# Patient Record
Sex: Female | Born: 1975
Health system: Southern US, Community
[De-identification: ages and names within clinical notes are randomized; demographics above are authoritative.]

## PROBLEM LIST (undated history)

## (undated) DIAGNOSIS — F32A Depression, unspecified: Secondary | ICD-10-CM

## (undated) DIAGNOSIS — R51 Headache: Secondary | ICD-10-CM

## (undated) DIAGNOSIS — Z87442 Personal history of urinary calculi: Secondary | ICD-10-CM

## (undated) DIAGNOSIS — R112 Nausea with vomiting, unspecified: Secondary | ICD-10-CM

## (undated) DIAGNOSIS — Z9889 Other specified postprocedural states: Secondary | ICD-10-CM

## (undated) DIAGNOSIS — E876 Hypokalemia: Secondary | ICD-10-CM

## (undated) DIAGNOSIS — F411 Generalized anxiety disorder: Secondary | ICD-10-CM

## (undated) DIAGNOSIS — M3500 Sicca syndrome, unspecified: Secondary | ICD-10-CM

## (undated) DIAGNOSIS — N3289 Other specified disorders of bladder: Secondary | ICD-10-CM

## (undated) DIAGNOSIS — I10 Essential (primary) hypertension: Secondary | ICD-10-CM

## (undated) DIAGNOSIS — R55 Syncope and collapse: Secondary | ICD-10-CM

## (undated) DIAGNOSIS — N289 Disorder of kidney and ureter, unspecified: Secondary | ICD-10-CM

## (undated) DIAGNOSIS — K649 Unspecified hemorrhoids: Secondary | ICD-10-CM

## (undated) DIAGNOSIS — I493 Ventricular premature depolarization: Secondary | ICD-10-CM

## (undated) DIAGNOSIS — K5909 Other constipation: Secondary | ICD-10-CM

## (undated) DIAGNOSIS — K219 Gastro-esophageal reflux disease without esophagitis: Secondary | ICD-10-CM

## (undated) DIAGNOSIS — R0602 Shortness of breath: Secondary | ICD-10-CM

## (undated) DIAGNOSIS — F329 Major depressive disorder, single episode, unspecified: Secondary | ICD-10-CM

## (undated) DIAGNOSIS — G8929 Other chronic pain: Secondary | ICD-10-CM

## (undated) DIAGNOSIS — R42 Dizziness and giddiness: Secondary | ICD-10-CM

## (undated) DIAGNOSIS — N83209 Unspecified ovarian cyst, unspecified side: Secondary | ICD-10-CM

## (undated) DIAGNOSIS — I472 Ventricular tachycardia: Secondary | ICD-10-CM

## (undated) DIAGNOSIS — B019 Varicella without complication: Secondary | ICD-10-CM

## (undated) DIAGNOSIS — T8859XA Other complications of anesthesia, initial encounter: Secondary | ICD-10-CM

## (undated) DIAGNOSIS — T7840XA Allergy, unspecified, initial encounter: Secondary | ICD-10-CM

## (undated) DIAGNOSIS — F410 Panic disorder [episodic paroxysmal anxiety] without agoraphobia: Secondary | ICD-10-CM

## (undated) DIAGNOSIS — Z9109 Other allergy status, other than to drugs and biological substances: Secondary | ICD-10-CM

## (undated) DIAGNOSIS — T4145XA Adverse effect of unspecified anesthetic, initial encounter: Secondary | ICD-10-CM

## (undated) DIAGNOSIS — I951 Orthostatic hypotension: Secondary | ICD-10-CM

## (undated) DIAGNOSIS — O24419 Gestational diabetes mellitus in pregnancy, unspecified control: Secondary | ICD-10-CM

## (undated) HISTORY — PX: WISDOM TOOTH EXTRACTION: SHX21

## (undated) HISTORY — PX: TUBAL LIGATION: SHX77

## (undated) HISTORY — DX: Gestational diabetes mellitus in pregnancy, unspecified control: O24.419

## (undated) HISTORY — DX: Other chronic pain: G89.29

## (undated) HISTORY — PX: ABDOMINAL HYSTERECTOMY: SHX81

## (undated) HISTORY — DX: Unspecified hemorrhoids: K64.9

## (undated) HISTORY — DX: Major depressive disorder, single episode, unspecified: F32.9

## (undated) HISTORY — DX: Essential (primary) hypertension: I10

## (undated) HISTORY — DX: Other allergy status, other than to drugs and biological substances: Z91.09

## (undated) HISTORY — DX: Shortness of breath: R06.02

## (undated) HISTORY — PX: EXPLORATORY LAPAROTOMY: SUR591

## (undated) HISTORY — DX: Panic disorder (episodic paroxysmal anxiety): F41.0

## (undated) HISTORY — DX: Ventricular tachycardia: I47.2

## (undated) HISTORY — DX: Varicella without complication: B01.9

## (undated) HISTORY — DX: Dizziness and giddiness: R42

## (undated) HISTORY — DX: Syncope and collapse: R55

## (undated) HISTORY — DX: Depression, unspecified: F32.A

## (undated) HISTORY — DX: Generalized anxiety disorder: F41.1

## (undated) HISTORY — DX: Headache: R51

## (undated) HISTORY — DX: Unspecified ovarian cyst, unspecified side: N83.209

## (undated) HISTORY — DX: Allergy, unspecified, initial encounter: T78.40XA

---

## 2018-03-03 ENCOUNTER — Emergency Department (HOSPITAL_COMMUNITY): Payer: Managed Care, Other (non HMO) | Admitting: Certified Registered Nurse Anesthetist

## 2018-03-03 ENCOUNTER — Other Ambulatory Visit: Payer: Self-pay

## 2018-03-03 ENCOUNTER — Encounter (HOSPITAL_COMMUNITY)
Admission: EM | Disposition: A | Discharge status: Home / Self Care | Payer: Self-pay | Source: Home / Self Care | Attending: Physician Assistant

## 2018-03-03 ENCOUNTER — Observation Stay (HOSPITAL_COMMUNITY): Payer: Managed Care, Other (non HMO)

## 2018-03-03 ENCOUNTER — Observation Stay (HOSPITAL_COMMUNITY)
Admission: EM | Admit: 2018-03-03 | Discharge: 2018-03-04 | Disposition: A | Discharge status: Home / Self Care | Payer: Managed Care, Other (non HMO) | Attending: Urology | Admitting: Urology

## 2018-03-03 ENCOUNTER — Encounter (HOSPITAL_COMMUNITY): Payer: Self-pay | Admitting: Emergency Medicine

## 2018-03-03 ENCOUNTER — Emergency Department (HOSPITAL_COMMUNITY): Payer: Managed Care, Other (non HMO)

## 2018-03-03 DIAGNOSIS — Z888 Allergy status to other drugs, medicaments and biological substances status: Secondary | ICD-10-CM | POA: Diagnosis not present

## 2018-03-03 DIAGNOSIS — E876 Hypokalemia: Secondary | ICD-10-CM | POA: Insufficient documentation

## 2018-03-03 DIAGNOSIS — M3500 Sicca syndrome, unspecified: Secondary | ICD-10-CM | POA: Diagnosis not present

## 2018-03-03 DIAGNOSIS — N132 Hydronephrosis with renal and ureteral calculous obstruction: Principal | ICD-10-CM | POA: Insufficient documentation

## 2018-03-03 DIAGNOSIS — I493 Ventricular premature depolarization: Secondary | ICD-10-CM | POA: Diagnosis not present

## 2018-03-03 DIAGNOSIS — N200 Calculus of kidney: Secondary | ICD-10-CM

## 2018-03-03 DIAGNOSIS — Z87442 Personal history of urinary calculi: Secondary | ICD-10-CM | POA: Diagnosis not present

## 2018-03-03 DIAGNOSIS — N179 Acute kidney failure, unspecified: Secondary | ICD-10-CM | POA: Insufficient documentation

## 2018-03-03 DIAGNOSIS — R079 Chest pain, unspecified: Secondary | ICD-10-CM

## 2018-03-03 DIAGNOSIS — I700 Atherosclerosis of aorta: Secondary | ICD-10-CM | POA: Diagnosis not present

## 2018-03-03 DIAGNOSIS — Z79899 Other long term (current) drug therapy: Secondary | ICD-10-CM | POA: Insufficient documentation

## 2018-03-03 DIAGNOSIS — I951 Orthostatic hypotension: Secondary | ICD-10-CM | POA: Insufficient documentation

## 2018-03-03 DIAGNOSIS — Z419 Encounter for procedure for purposes other than remedying health state, unspecified: Secondary | ICD-10-CM

## 2018-03-03 HISTORY — DX: Ventricular premature depolarization: I49.3

## 2018-03-03 HISTORY — DX: Hypokalemia: E87.6

## 2018-03-03 HISTORY — PX: CYSTOSCOPY WITH STENT PLACEMENT: SHX5790

## 2018-03-03 HISTORY — DX: Orthostatic hypotension: I95.1

## 2018-03-03 HISTORY — DX: Sjogren syndrome, unspecified: M35.00

## 2018-03-03 LAB — URINALYSIS, ROUTINE W REFLEX MICROSCOPIC
BILIRUBIN URINE: NEGATIVE
Bacteria, UA: NONE SEEN
Glucose, UA: NEGATIVE mg/dL
Ketones, ur: NEGATIVE mg/dL
NITRITE: NEGATIVE
PH: 6 (ref 5.0–8.0)
Protein, ur: NEGATIVE mg/dL
Specific Gravity, Urine: 1.01 (ref 1.005–1.030)

## 2018-03-03 LAB — COMPREHENSIVE METABOLIC PANEL
ALT: 12 U/L — AB (ref 14–54)
AST: 15 U/L (ref 15–41)
Albumin: 3.5 g/dL (ref 3.5–5.0)
Alkaline Phosphatase: 45 U/L (ref 38–126)
Anion gap: 10 (ref 5–15)
BUN: 48 mg/dL — ABNORMAL HIGH (ref 6–20)
CALCIUM: 9 mg/dL (ref 8.9–10.3)
CHLORIDE: 110 mmol/L (ref 101–111)
CO2: 17 mmol/L — ABNORMAL LOW (ref 22–32)
CREATININE: 6.44 mg/dL — AB (ref 0.44–1.00)
GFR, EST AFRICAN AMERICAN: 8 mL/min — AB (ref >60)
GFR, EST NON AFRICAN AMERICAN: 7 mL/min — AB (ref >60)
Glucose, Bld: 99 mg/dL (ref 65–99)
Potassium: 4.3 mmol/L (ref 3.5–5.1)
Sodium: 137 mmol/L (ref 135–145)
TOTAL PROTEIN: 6.8 g/dL (ref 6.5–8.1)
Total Bilirubin: 0.5 mg/dL (ref 0.3–1.2)

## 2018-03-03 LAB — I-STAT BETA HCG BLOOD, ED (MC, WL, AP ONLY): I-stat hCG, quantitative: <5 m[IU]/mL (ref <5)

## 2018-03-03 LAB — CBC
HCT: 33.3 % — ABNORMAL LOW (ref 36.0–46.0)
HEMATOCRIT: 33.7 % — AB (ref 36.0–46.0)
HEMOGLOBIN: 10.7 g/dL — AB (ref 12.0–15.0)
Hemoglobin: 10.9 g/dL — ABNORMAL LOW (ref 12.0–15.0)
MCH: 30.1 pg (ref 26.0–34.0)
MCH: 29.8 pg (ref 26.0–34.0)
MCHC: 32.7 g/dL (ref 30.0–36.0)
MCHC: 31.8 g/dL (ref 30.0–36.0)
MCV: 92 fL (ref 78.0–100.0)
MCV: 93.9 fL (ref 78.0–100.0)
Platelets: 142 10*3/uL — ABNORMAL LOW (ref 150–400)
Platelets: 155 10*3/uL (ref 150–400)
RBC: 3.62 MIL/uL — ABNORMAL LOW (ref 3.87–5.11)
RBC: 3.59 MIL/uL — AB (ref 3.87–5.11)
RDW: 12.4 % (ref 11.5–15.5)
RDW: 12.6 % (ref 11.5–15.5)
WBC: 6.1 10*3/uL (ref 4.0–10.5)
WBC: 6.1 10*3/uL (ref 4.0–10.5)

## 2018-03-03 LAB — BASIC METABOLIC PANEL
ANION GAP: 10 (ref 5–15)
BUN: 40 mg/dL — ABNORMAL HIGH (ref 6–20)
CALCIUM: 8.5 mg/dL — AB (ref 8.9–10.3)
CO2: 16 mmol/L — AB (ref 22–32)
Chloride: 116 mmol/L — ABNORMAL HIGH (ref 101–111)
Creatinine, Ser: 4.68 mg/dL — ABNORMAL HIGH (ref 0.44–1.00)
GFR calc non Af Amer: 11 mL/min — ABNORMAL LOW (ref >60)
GFR, EST AFRICAN AMERICAN: 12 mL/min — AB (ref >60)
Glucose, Bld: 89 mg/dL (ref 65–99)
POTASSIUM: 4.5 mmol/L (ref 3.5–5.1)
Sodium: 142 mmol/L (ref 135–145)

## 2018-03-03 LAB — CK TOTAL AND CKMB (NOT AT ARMC)
CK, MB: 1.1 ng/mL (ref 0.5–5.0)
RELATIVE INDEX: INVALID (ref 0.0–2.5)
Total CK: 43 U/L (ref 38–234)

## 2018-03-03 LAB — LIPASE, BLOOD: LIPASE: 38 U/L (ref 11–51)

## 2018-03-03 LAB — CK: Total CK: 51 U/L (ref 38–234)

## 2018-03-03 SURGERY — CYSTOSCOPY, WITH STENT INSERTION
Anesthesia: General | Site: Bladder | Laterality: Bilateral

## 2018-03-03 MED ORDER — ROCURONIUM BROMIDE 10 MG/ML (PF) SYRINGE
PREFILLED_SYRINGE | INTRAVENOUS | Status: AC
  Filled 2018-03-03: qty 5

## 2018-03-03 MED ORDER — SENNOSIDES-DOCUSATE SODIUM 8.6-50 MG PO TABS: 1.0000 | ORAL_TABLET | Freq: Every evening | ORAL | Status: DC | PRN

## 2018-03-03 MED ORDER — GABAPENTIN 600 MG PO TABS: 600.0000 mg | ORAL_TABLET | Freq: Every evening | ORAL | Status: DC | PRN

## 2018-03-03 MED ORDER — MORPHINE SULFATE (PF) 4 MG/ML IV SOLN
0.5000 mg | Freq: Once | INTRAVENOUS | Status: AC
  Administered 2018-03-03: 0.5 mg via INTRAVENOUS

## 2018-03-03 MED ORDER — PROPOFOL 10 MG/ML IV BOLUS
INTRAVENOUS | Status: DC | PRN
  Administered 2018-03-03: 150 mg via INTRAVENOUS

## 2018-03-03 MED ORDER — MORPHINE SULFATE (PF) 4 MG/ML IV SOLN
4.0000 mg | Freq: Once | INTRAVENOUS | Status: AC
  Administered 2018-03-03: 4 mg via INTRAVENOUS
  Filled 2018-03-03: qty 1

## 2018-03-03 MED ORDER — FLEET ENEMA 7-19 GM/118ML RE ENEM
1.0000 | ENEMA | Freq: Once | RECTAL | Status: DC | PRN
  Filled 2018-03-03 (×2): qty 1

## 2018-03-03 MED ORDER — SUCCINYLCHOLINE CHLORIDE 20 MG/ML IJ SOLN
INTRAMUSCULAR | Status: DC | PRN
  Administered 2018-03-03: 80 mg via INTRAVENOUS

## 2018-03-03 MED ORDER — DIPHENHYDRAMINE HCL 50 MG/ML IJ SOLN: 12.5000 mg | Freq: Four times a day (QID) | INTRAMUSCULAR | Status: DC | PRN

## 2018-03-03 MED ORDER — LIDOCAINE HCL (CARDIAC) PF 100 MG/5ML IV SOSY
PREFILLED_SYRINGE | INTRAVENOUS | Status: DC | PRN
  Administered 2018-03-03: 50 mg via INTRAVENOUS
  Administered 2018-03-03: 50 mg via INTRATRACHEAL

## 2018-03-03 MED ORDER — SODIUM CHLORIDE 0.9 % IV BOLUS
500.0000 mL | Freq: Once | INTRAVENOUS | Status: AC
  Administered 2018-03-03: 500 mL via INTRAVENOUS

## 2018-03-03 MED ORDER — MEPERIDINE HCL 50 MG/ML IJ SOLN
INTRAMUSCULAR | Status: AC
  Filled 2018-03-03: qty 1

## 2018-03-03 MED ORDER — TRAMADOL HCL 50 MG PO TABS: 50.0000 mg | ORAL_TABLET | Freq: Two times a day (BID) | ORAL | Status: DC | PRN

## 2018-03-03 MED ORDER — DEXAMETHASONE SODIUM PHOSPHATE 10 MG/ML IJ SOLN
INTRAMUSCULAR | Status: DC | PRN
  Administered 2018-03-03: 10 mg via INTRAVENOUS

## 2018-03-03 MED ORDER — MIDAZOLAM HCL 2 MG/2ML IJ SOLN
INTRAMUSCULAR | Status: AC
  Filled 2018-03-03: qty 2

## 2018-03-03 MED ORDER — LORAZEPAM 0.5 MG PO TABS: 0.5000 mg | ORAL_TABLET | Freq: Every day | ORAL | Status: DC | PRN

## 2018-03-03 MED ORDER — ONDANSETRON HCL 4 MG/2ML IJ SOLN
INTRAMUSCULAR | Status: DC | PRN
  Administered 2018-03-03: 4 mg via INTRAVENOUS

## 2018-03-03 MED ORDER — BISACODYL 10 MG RE SUPP: 10.0000 mg | Freq: Every day | RECTAL | Status: DC | PRN

## 2018-03-03 MED ORDER — MIDAZOLAM HCL 5 MG/5ML IJ SOLN
INTRAMUSCULAR | Status: DC | PRN
  Administered 2018-03-03: 2 mg via INTRAVENOUS

## 2018-03-03 MED ORDER — PROMETHAZINE HCL 25 MG/ML IJ SOLN
6.2500 mg | Freq: Once | INTRAMUSCULAR | Status: AC
Start: 2018-03-03 — End: 2018-03-03
  Administered 2018-03-03: 6.25 mg via INTRAVENOUS

## 2018-03-03 MED ORDER — STERILE WATER FOR IRRIGATION IR SOLN
Status: DC | PRN
  Administered 2018-03-03: 3000 mL

## 2018-03-03 MED ORDER — FENTANYL CITRATE (PF) 100 MCG/2ML IJ SOLN: 25.0000 ug | INTRAMUSCULAR | Status: DC | PRN

## 2018-03-03 MED ORDER — POTASSIUM CHLORIDE IN NACL 20-0.45 MEQ/L-% IV SOLN
INTRAVENOUS | Status: DC
  Administered 2018-03-03 – 2018-03-04 (×2): via INTRAVENOUS
  Filled 2018-03-03 (×2): qty 1000

## 2018-03-03 MED ORDER — CEFAZOLIN SODIUM-DEXTROSE 2-4 GM/100ML-% IV SOLN
2.0000 g | Freq: Once | INTRAVENOUS | Status: AC
  Administered 2018-03-03: 2 g via INTRAVENOUS
  Filled 2018-03-03: qty 100

## 2018-03-03 MED ORDER — MYCOPHENOLATE MOFETIL 250 MG PO CAPS: 750.0000 mg | ORAL_CAPSULE | Freq: Two times a day (BID) | ORAL | Status: DC

## 2018-03-03 MED ORDER — ZOLPIDEM TARTRATE 5 MG PO TABS: 5.0000 mg | ORAL_TABLET | Freq: Every evening | ORAL | Status: DC | PRN

## 2018-03-03 MED ORDER — MORPHINE SULFATE (PF) 4 MG/ML IV SOLN
INTRAVENOUS | Status: AC
  Filled 2018-03-03: qty 1

## 2018-03-03 MED ORDER — ONDANSETRON HCL 4 MG/2ML IJ SOLN
INTRAMUSCULAR | Status: AC
  Filled 2018-03-03: qty 2

## 2018-03-03 MED ORDER — SUCCINYLCHOLINE CHLORIDE 200 MG/10ML IV SOSY
PREFILLED_SYRINGE | INTRAVENOUS | Status: AC
  Filled 2018-03-03: qty 10

## 2018-03-03 MED ORDER — NITROGLYCERIN 0.4 MG SL SUBL
SUBLINGUAL_TABLET | SUBLINGUAL | Status: AC
  Administered 2018-03-03: 0.4 mg via SUBLINGUAL
  Filled 2018-03-03: qty 1

## 2018-03-03 MED ORDER — LIDOCAINE HCL URETHRAL/MUCOSAL 2 % EX GEL
CUTANEOUS | Status: AC
  Filled 2018-03-03: qty 20

## 2018-03-03 MED ORDER — DIPHENHYDRAMINE HCL 12.5 MG/5ML PO ELIX: 12.5000 mg | ORAL_SOLUTION | Freq: Four times a day (QID) | ORAL | Status: DC | PRN

## 2018-03-03 MED ORDER — ONDANSETRON HCL 4 MG/2ML IJ SOLN: 4.0000 mg | INTRAMUSCULAR | Status: DC | PRN

## 2018-03-03 MED ORDER — ENOXAPARIN SODIUM 30 MG/0.3ML ~~LOC~~ SOLN: 30.0000 mg | SUBCUTANEOUS | Status: DC

## 2018-03-03 MED ORDER — PANTOPRAZOLE SODIUM 40 MG PO TBEC: 40.0000 mg | DELAYED_RELEASE_TABLET | Freq: Every day | ORAL | Status: DC

## 2018-03-03 MED ORDER — LORATADINE 10 MG PO TABS
10.0000 mg | ORAL_TABLET | Freq: Every day | ORAL | Status: DC
  Administered 2018-03-04: 10 mg via ORAL
  Filled 2018-03-03: qty 1

## 2018-03-03 MED ORDER — ONDANSETRON HCL 4 MG/2ML IJ SOLN
4.0000 mg | Freq: Once | INTRAMUSCULAR | Status: AC
  Administered 2018-03-03: 4 mg via INTRAVENOUS
  Filled 2018-03-03: qty 2

## 2018-03-03 MED ORDER — PROMETHAZINE HCL 25 MG/ML IJ SOLN
INTRAMUSCULAR | Status: AC
  Filled 2018-03-03: qty 1

## 2018-03-03 MED ORDER — SODIUM CHLORIDE 0.9 % IV SOLN
INTRAVENOUS | Status: DC
  Administered 2018-03-03: 12:00:00 via INTRAVENOUS

## 2018-03-03 MED ORDER — MORPHINE SULFATE (PF) 4 MG/ML IV SOLN
1.0000 mg | Freq: Once | INTRAVENOUS | Status: AC
  Administered 2018-03-03: 1 mg via INTRAVENOUS

## 2018-03-03 MED ORDER — SUGAMMADEX SODIUM 200 MG/2ML IV SOLN
INTRAVENOUS | Status: AC
  Filled 2018-03-03: qty 2

## 2018-03-03 MED ORDER — IOPAMIDOL (ISOVUE-300) INJECTION 61%
INTRAVENOUS | Status: AC
  Filled 2018-03-03: qty 50

## 2018-03-03 MED ORDER — HEPARIN SODIUM (PORCINE) 5000 UNIT/ML IJ SOLN
5000.0000 [IU] | Freq: Three times a day (TID) | INTRAMUSCULAR | Status: DC
  Administered 2018-03-04: 5000 [IU] via SUBCUTANEOUS
  Filled 2018-03-03: qty 1

## 2018-03-03 MED ORDER — METOPROLOL SUCCINATE ER 25 MG PO TB24
25.0000 mg | ORAL_TABLET | Freq: Every day | ORAL | Status: DC
  Administered 2018-03-03 – 2018-03-04 (×2): 25 mg via ORAL
  Filled 2018-03-03 (×2): qty 1

## 2018-03-03 MED ORDER — IOPAMIDOL (ISOVUE-300) INJECTION 61%
INTRAVENOUS | Status: DC | PRN
  Administered 2018-03-03: 50 mL

## 2018-03-03 MED ORDER — FENTANYL CITRATE (PF) 100 MCG/2ML IJ SOLN
INTRAMUSCULAR | Status: DC | PRN
  Administered 2018-03-03: 50 ug via INTRAVENOUS

## 2018-03-03 MED ORDER — BUSPIRONE HCL 10 MG PO TABS: 10.0000 mg | ORAL_TABLET | Freq: Every evening | ORAL | Status: DC | PRN

## 2018-03-03 MED ORDER — DEXAMETHASONE SODIUM PHOSPHATE 10 MG/ML IJ SOLN
INTRAMUSCULAR | Status: AC
  Filled 2018-03-03: qty 1

## 2018-03-03 MED ORDER — ACETAMINOPHEN 325 MG PO TABS: 650.0000 mg | ORAL_TABLET | ORAL | Status: DC | PRN

## 2018-03-03 MED ORDER — TRAZODONE HCL 100 MG PO TABS
100.0000 mg | ORAL_TABLET | Freq: Every evening | ORAL | Status: DC | PRN
  Administered 2018-03-03: 100 mg via ORAL
  Filled 2018-03-03: qty 1

## 2018-03-03 MED ORDER — SODIUM CHLORIDE 0.9 % IV BOLUS
1000.0000 mL | Freq: Once | INTRAVENOUS | Status: AC
Start: 2018-03-03 — End: 2018-03-03
  Administered 2018-03-03: 1000 mL via INTRAVENOUS

## 2018-03-03 MED ORDER — CEFAZOLIN SODIUM-DEXTROSE 1-4 GM/50ML-% IV SOLN
1.0000 g | Freq: Two times a day (BID) | INTRAVENOUS | Status: DC
  Administered 2018-03-03: 1 g via INTRAVENOUS
  Filled 2018-03-03 (×2): qty 50

## 2018-03-03 MED ORDER — MYCOPHENOLATE MOFETIL 500 MG PO TABS: 500.0000 mg | ORAL_TABLET | Freq: Two times a day (BID) | ORAL | Status: DC

## 2018-03-03 MED ORDER — HYDROCODONE-ACETAMINOPHEN 5-325 MG PO TABS: 1.0000 | ORAL_TABLET | Freq: Four times a day (QID) | ORAL | Status: DC | PRN

## 2018-03-03 MED ORDER — METOPROLOL SUCCINATE ER 25 MG PO TB24: 25.0000 mg | ORAL_TABLET | Freq: Every day | ORAL | Status: DC

## 2018-03-03 MED ORDER — PANTOPRAZOLE SODIUM 40 MG PO TBEC
40.0000 mg | DELAYED_RELEASE_TABLET | Freq: Every day | ORAL | Status: DC
  Administered 2018-03-03 – 2018-03-04 (×2): 40 mg via ORAL
  Filled 2018-03-03 (×2): qty 1

## 2018-03-03 MED ORDER — PROPOFOL 10 MG/ML IV BOLUS
INTRAVENOUS | Status: AC
  Filled 2018-03-03: qty 20

## 2018-03-03 MED ORDER — MEPERIDINE HCL 50 MG/ML IJ SOLN
6.2500 mg | INTRAMUSCULAR | Status: DC | PRN
  Administered 2018-03-03: 12.5 mg via INTRAVENOUS

## 2018-03-03 MED ORDER — PHENYLEPHRINE 40 MCG/ML (10ML) SYRINGE FOR IV PUSH (FOR BLOOD PRESSURE SUPPORT)
PREFILLED_SYRINGE | INTRAVENOUS | Status: AC
  Filled 2018-03-03: qty 10

## 2018-03-03 MED ORDER — MYCOPHENOLATE MOFETIL 250 MG PO CAPS
750.0000 mg | ORAL_CAPSULE | Freq: Two times a day (BID) | ORAL | Status: DC
  Administered 2018-03-03 – 2018-03-04 (×2): 750 mg via ORAL
  Filled 2018-03-03 (×2): qty 3

## 2018-03-03 MED ORDER — NITROGLYCERIN 0.4 MG SL SUBL
0.4000 mg | SUBLINGUAL_TABLET | SUBLINGUAL | Status: DC | PRN
  Administered 2018-03-03 (×3): 0.4 mg via SUBLINGUAL

## 2018-03-03 MED ORDER — MORPHINE SULFATE (PF) 4 MG/ML IV SOLN
INTRAVENOUS | Status: AC
  Administered 2018-03-03: 0.5 mg via INTRAVENOUS
  Filled 2018-03-03: qty 1

## 2018-03-03 MED ORDER — FENTANYL CITRATE (PF) 250 MCG/5ML IJ SOLN
INTRAMUSCULAR | Status: AC
  Filled 2018-03-03: qty 5

## 2018-03-03 SURGICAL SUPPLY — 35 items
ADAPTER CATH URET PLST 4-6FR (CATHETERS) IMPLANT
BAG URINE DRAINAGE (UROLOGICAL SUPPLIES) IMPLANT
BAG URO CATCHER STRL LF (MISCELLANEOUS) ×2 IMPLANT
BENZOIN TINCTURE PRP APPL 2/3 (GAUZE/BANDAGES/DRESSINGS) IMPLANT
BLADE 10 SAFETY STRL DISP (BLADE) ×2 IMPLANT
BUCKET BIOHAZARD WASTE 5 GAL (MISCELLANEOUS) IMPLANT
CATH FOLEY 2WAY SLVR  5CC 16FR (CATHETERS)
CATH FOLEY 2WAY SLVR 5CC 16FR (CATHETERS) IMPLANT
CATH INTERMIT  6FR 70CM (CATHETERS) IMPLANT
CATH URET 5FR 28IN CONE TIP (BALLOONS) ×1
CATH URET 5FR 70CM CONE TIP (BALLOONS) ×1 IMPLANT
COVER SURGICAL LIGHT HANDLE (MISCELLANEOUS) IMPLANT
DRAPE CAMERA CLOSED 9X96 (DRAPES) IMPLANT
GLOVE BIO SURGEON STRL SZ7.5 (GLOVE) ×2 IMPLANT
GOWN STRL REUS W/ TWL XL LVL3 (GOWN DISPOSABLE) ×2 IMPLANT
GOWN STRL REUS W/TWL XL LVL3 (GOWN DISPOSABLE) ×2
GUIDEWIRE ANG ZIPWIRE 038X150 (WIRE) IMPLANT
GUIDEWIRE COOK  .035 (WIRE) IMPLANT
GUIDEWIRE STR DUAL SENSOR (WIRE) ×2 IMPLANT
KIT TURNOVER KIT B (KITS) ×2 IMPLANT
MANIFOLD NEPTUNE II (INSTRUMENTS) IMPLANT
NS IRRIG 1000ML POUR BTL (IV SOLUTION) IMPLANT
PACK CYSTO (CUSTOM PROCEDURE TRAY) ×2 IMPLANT
PAD ARMBOARD 7.5X6 YLW CONV (MISCELLANEOUS) ×4 IMPLANT
PLUG CATH AND CAP STER (CATHETERS) IMPLANT
STENT INLAY 6X24 (STENTS) IMPLANT
STENT URET 6FRX24 CONTOUR (STENTS) ×4 IMPLANT
SYRINGE CONTROL L 12CC (SYRINGE) ×2 IMPLANT
SYRINGE TOOMEY DISP (SYRINGE) IMPLANT
TRAY FOLEY W/BAG SLVR 16FR (SET/KITS/TRAYS/PACK) ×1
TRAY FOLEY W/BAG SLVR 16FR ST (SET/KITS/TRAYS/PACK) ×1 IMPLANT
UNDERPAD 30X30 (UNDERPADS AND DIAPERS) ×2 IMPLANT
WATER STERILE IRR 1000ML POUR (IV SOLUTION) IMPLANT
WATER STERILE IRR 3000ML UROMA (IV SOLUTION) ×2 IMPLANT
WIRE COONS/BENSON .038X145CM (WIRE) IMPLANT

## 2018-03-03 NOTE — ED Provider Notes (Signed)
[facility] EMERGENCY DEPARTMENT Provider Note   CSN: [id number] Arrival date & time: 03/03/18  0049     History   Chief Complaint Chief Complaint  Patient presents with  . Abdominal Pain    HPI [name] is a 42 y.o. 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  She has a history of Sojourn syndrome, however has not had any renal involvement at this time.  She reports that over the weekend she has had multiple episodes of right lower abdominal pain that radiates to midline.  She reports that this pain is made better with vomiting or dry heaving.  She reports that she has never had anything like this before, normally her creatinines are normal.  She has had issues with hypokalemia in the past.  She is status post hysterectomy.  She reports that she takes a beta-blocker for palpitations.  HPI  Past Medical History:  Diagnosis Date  . Sjogren's syndrome (HCC)     There are no active problems to display for this patient.   Past Surgical History:  Procedure Laterality Date  . ABDOMINAL HYSTERECTOMY     partial  . CESAREAN SECTION     x3     OB History   None      Home Medications    Prior to Admission medications   Medication Sig Start Date End Date Taking? Authorizing Provider  busPIRone (BUSPAR) 10 MG tablet Take 10 mg by mouth at bedtime as needed (for sleep).   Yes [provider]  Cholecalciferol (VITAMIN D3) 5000 units CAPS Take 5,000 Units by mouth daily.   Yes [provider]  diphenhydrAMINE (BENADRYL) 50 MG capsule Take 50 mg by mouth at bedtime as needed for sleep.   Yes [provider]  gabapentin (NEURONTIN) 600 MG tablet Take 600 mg by mouth at bedtime as needed (for pain).   Yes [provider]  Glucosamine HCl 1500 MG TABS Take 1,500 mg by mouth daily.   Yes [provider]  loratadine (CLARITIN)  10 MG tablet Take 10 mg by mouth daily.   Yes [provider]  LORazepam (ATIVAN) 0.5 MG tablet Take 0.5-1 mg by mouth daily as needed for anxiety.   Yes [provider]  metoprolol succinate (TOPROL-XL) 25 MG 24 hr tablet Take 25 mg by mouth daily.   Yes [provider]  Multiple Vitamin (MULTIVITAMIN WITH MINERALS) TABS tablet Take 1 tablet by mouth daily.   Yes [provider]  mycophenolate (CELLCEPT) 250 MG capsule Take 250 mg by mouth 2 (two) times daily. Take with Cellcept 500 mg to equal 750 mg   Yes [provider]  mycophenolate (CELLCEPT) 500 MG tablet Take 500 mg by mouth 2 (two) times daily. Take with Cellcept 250 mg to equal 750 mg   Yes [provider]  omeprazole (PRILOSEC) 20 MG capsule Take 20 mg by mouth daily.   Yes [provider]  ondansetron (ZOFRAN) 8 MG tablet Take by mouth every 8 (eight) hours as needed for nausea or vomiting.   Yes [provider]  potassium chloride SA (K-DUR,KLOR-CON) 20 MEQ tablet Take 20 mEq by mouth 2 (two) times daily.   Yes [provider]  traMADol (ULTRAM) 50 MG tablet Take 50-100 mg by mouth every 8 (eight) hours as needed for moderate pain.   Yes [provider]  traZODone (DESYREL)  100 MG tablet Take 100 mg by mouth at bedtime as needed for sleep.    [provider]    Family History No family history on file.  Social History Social History   Tobacco Use  . Smoking status: Never Smoker  . Smokeless tobacco: Never Used  Substance Use Topics  . Alcohol use: Never    Frequency: Never  . Drug use: Never     Allergies   Hydromorphone hcl and Tape   Review of Systems Review of Systems  Constitutional: Positive for fatigue. Negative for chills and fever.  HENT: Negative for ear pain and sore throat.   Eyes: Negative for pain and visual disturbance.  Respiratory: Negative for cough and shortness of breath.   Cardiovascular: Negative  for chest pain and palpitations.  Gastrointestinal: Positive for abdominal pain, nausea and vomiting. Negative for constipation and diarrhea.  Genitourinary: Positive for flank pain. Negative for difficulty urinating, dysuria, frequency, hematuria, urgency, vaginal bleeding, vaginal discharge and vaginal pain.  Musculoskeletal: Positive for back pain. Negative for arthralgias and neck pain.  Skin: Negative for color change and rash.  Neurological: Negative for seizures and syncope.  Psychiatric/Behavioral: Negative for confusion.  All other systems reviewed and are negative.    Physical Exam Updated Vital Signs BP 120/73   Pulse 63   Temp 98.1 F (36.7 C) (Oral)   Resp 14   Ht  (1.753 m)   Wt 81.6 kg (180 lb)   SpO2 100%   BMI 26.58 kg/m   Physical Exam  Constitutional: She appears well-developed and well-nourished. No distress.  HENT:  Head: Normocephalic and atraumatic.  Eyes: Conjunctivae are normal. No scleral icterus.  Neck: Neck supple.  Cardiovascular: Normal rate and regular rhythm.  No murmur heard. Pulmonary/Chest: Effort normal and breath sounds normal. No stridor. No respiratory distress.  Abdominal: Soft. Normal appearance and bowel sounds are normal. There is tenderness in the right lower quadrant and suprapubic area. There is CVA tenderness (Mild, right sided).  Genitourinary:  Genitourinary Comments: Deferred  Musculoskeletal: She exhibits no edema.  Neurological: She is alert.  Skin: Skin is warm and dry.  Psychiatric: She has a normal mood and affect.  Nursing note and vitals reviewed.    ED Treatments / Results  Labs (all labs ordered are listed, but only abnormal results are displayed) Labs Reviewed  COMPREHENSIVE METABOLIC PANEL - Abnormal; Notable for the following components:      Result Value   CO2 17 (*)    BUN 48 (*)    Creatinine, Ser 6.44 (*)    ALT 12 (*)    GFR calc non Af Amer 7 (*)    GFR calc Af Amer 8 (*)    All other  components within normal limits  CBC - Abnormal; Notable for the following components:   RBC 3.62 (*)    Hemoglobin 10.9 (*)    HCT 33.3 (*)    Platelets 142 (*)    All other components within normal limits  URINALYSIS, ROUTINE W REFLEX MICROSCOPIC - Abnormal; Notable for the following components:   Hgb urine dipstick LARGE (*)    Leukocytes, UA LARGE (*)    All other components within normal limits  LIPASE, BLOOD  CK  I-STAT BETA HCG BLOOD, ED ([facility], [facility], [facility] ONLY)    EKG None  Radiology No results found.  Procedures Procedures (including critical care time)  Medications Ordered in ED Medications  ondansetron (ZOFRAN) injection 4 mg (4 mg Intravenous Given  03/03/18 1610)  sodium chloride 0.9 % bolus 1,000 mL (0 mLs Intravenous Stopped 03/03/18 0627)  morphine 4 MG/ML injection 4 mg (4 mg Intravenous Given 03/03/18 0630)     Initial Impression / Assessment and Plan / ED Course  I have reviewed the triage vital signs and the nursing notes.  Pertinent labs & imaging results that were available during my care of the patient were reviewed by me and considered in my medical decision making (see chart for details).     Patient presents today for evaluation of right lower quadrant abdominal pain, nausea, and vomiting.  She has a history of Sjogren's syndrome.  Patient is a traveling Charity fundraiser.  Previous medical records are unavailable, however she reports that she has a normal creatinine and occasionally has issues with hypokalemia.  On CMP her creatinine here is 6.44 with a BUN of 48, and a GFR of 7.  Her urine was significant for large hemoglobin, large leukocytes with no bacteria seen.  Hemoglobin 10.9 which patient states is consistent with her usual.  Due to sudden severe decline in kidney function CK was ordered which was 51.  She was given a gentle fluid bolus in the ER and morphine for her pain.  CT renal stone study was ordered for further evaluation.  At shift change care was  transferred to [facility]. Maczis PA-C who will follow pending studies, re-evaulate and determine disposition.     Final Clinical Impressions(s) / ED Diagnoses   Final diagnoses:  Acute renal failure, unspecified acute renal failure type [facility])    ED Discharge Orders    None       Cristina Gong, PA-C 03/03/18 0720    Gilda Crease, MD 03/12/18 [phone]

## 2018-03-03 NOTE — Progress Notes (Signed)
Late entry: Prior to patient leaving Short Stay 3 bags taken to PACU.

## 2018-03-03 NOTE — Progress Notes (Signed)
Patient ID: [name], female   DOB: [birth year], 42 y.o.   MRN: [id number]  Doing well postop.  She had some CP in the PACU but was seen by Dr. Antoine Poche who felt it was unlikely to be cardiac.  The pain has resolved.  Her flank pain has improved.  She has good UOP by foley and her Cr is down to 4.68.    Plan to d/c foley in AM and discharge if renal function has sufficiently improved.  She will need ureteroscopy for the stones in a week or two.

## 2018-03-03 NOTE — H&P (View-Only) (Signed)
Subjective: CC:  Right flank pain.  Hx:  [name] is a 42 yo WF who I was asked to see in consultation by Dr. Corlis Leak for bilateral ureteral stones with ARI.   She had moderate left flank pain and nausea 4 weeks ago and then over the weekend she had right flank pain that was severe and recurred last night.  She has had some issues with urgency over the past couple of years but that hasn't gotten worse.  She has had no hematuria.  She has had no fever.  She had some chills Sunday night and last night.   She has had no prior stones and has only had one UTI.   She had a renal biopsy in 2016 for evaluation of Sjogren's syndrome.   She is on immunosuppressant for management of that.   She has a history of hypokalemia and is on potassium.   ROS:  Review of Systems  Constitutional: Positive for chills.  Gastrointestinal: Positive for abdominal pain, nausea and vomiting.  Genitourinary: Positive for flank pain and urgency.  All other systems reviewed and are negative.   Allergies  Allergen Reactions  . Hydromorphone Hcl Shortness Of Breath  . Tape Rash    Past Medical History:  Diagnosis Date  . Sjogren's syndrome [facility])     Past Surgical History:  Procedure Laterality Date  . ABDOMINAL HYSTERECTOMY     partial  . CESAREAN SECTION     x3    Social History   Socioeconomic History  . Marital status: Married    Spouse name: Not on file  . Number of children: Not on file  . Years of education: Not on file  . Highest education level: Not on file  Occupational History  . Not on file  Social Needs  . Financial resource strain: Not on file  . Food insecurity:    Worry: Not on file    Inability: Not on file  . Transportation needs:    Medical: Not on file    Non-medical: Not on file  Tobacco Use  . Smoking status: Never Smoker  . Smokeless tobacco: Never Used  Substance and Sexual Activity  . Alcohol use: Never    Frequency: Never  . Drug use: Never  . Sexual activity: Not on  file  Lifestyle  . Physical activity:    Days per week: Not on file    Minutes per session: Not on file  . Stress: Not on file  Relationships  . Social connections:    Talks on phone: Not on file    Gets together: Not on file    Attends religious service: Not on file    Active member of club or organization: Not on file    Attends meetings of clubs or organizations: Not on file    Relationship status: Not on file  . Intimate partner violence:    Fear of current or ex partner: Not on file    Emotionally abused: Not on file    Physically abused: Not on file    Forced sexual activity: Not on file  Other Topics Concern  . Not on file  Social History Narrative  . Not on file    No family history on file.  Anti-infectives: Anti-infectives (From admission, onward)   None      No current facility-administered medications for this encounter.    Current Outpatient Medications  Medication Sig Dispense Refill  . busPIRone (BUSPAR) 10 MG tablet Take 10 mg by mouth  at bedtime as needed (for sleep).    . Cholecalciferol (VITAMIN D3) 5000 units CAPS Take 5,000 Units by mouth daily.    . diphenhydrAMINE (BENADRYL) 50 MG capsule Take 50 mg by mouth at bedtime as needed for sleep.    . gabapentin (NEURONTIN) 600Marland Kitchen MG tablet Take 600 mg by mouth at bedtime as needed (for pain).    . Glucosamine HCl 1500 MG TABS Take 1,500 mg by mouth daily.    Marland Kitchen loratadine (CLARITIN) 10 MG tablet Take 10 mg by mouth daily.    Marland Kitchen LORazepam (ATIVAN) 0.5 MG tablet Take 0.5-1 mg by mouth daily as needed for anxiety.    . metoprolol succinate (TOPROL-XL) 25 MG 24 hr tablet Take 25 mg by mouth daily.    . Multiple Vitamin (MULTIVITAMIN WITH MINERALS) TABS tablet Take 1 tablet by mouth daily.    . mycophenolate (CELLCEPT) 250 MG capsule Take 250 mg by mouth 2 (two) times daily. Take with Cellcept 500 mg to equal 750 mg    . mycophenolate (CELLCEPT) 500 MG tablet Take 500 mg by mouth 2 (two) times daily. Take with  Cellcept 250 mg to equal 750 mg    . omeprazole (PRILOSEC) 20 MG capsule Take 20 mg by mouth daily.    . ondansetron (ZOFRAN) 8 MG tablet Take by mouth every 8 (eight) hours as needed for nausea or vomiting.    . potassium chloride SA (K-DUR,KLOR-CON) 20 MEQ tablet Take 20 mEq by mouth 2 (two) times daily.    . traMADol (ULTRAM) 50 MG tablet Take 50-100 mg by mouth every 8 (eight) hours as needed for moderate pain.    . traZODone (DESYREL) 100 MG tablet Take 100 mg by mouth at bedtime as needed for sleep.       Objective: Vital signs in last 24 hours: Temp:  [98.1 F (36.7 C)] 98.1 F (36.7 C) (05/21 0102) Pulse Rate:  [63-78] 66 (05/21 0800) Resp:  [14-22] 17 (05/21 0800) BP: (112-131)/(63-80) 122/71 (05/21 0800) SpO2:  [99 %-100 %] 99 % (05/21 0800) Weight:  [81.6 kg (180 lb)] 81.6 kg (180 lb) (05/21 0103)  Intake/Output from previous day: No intake/output data recorded. Intake/Output this shift: No intake/output data recorded.   Physical Exam  Constitutional: She is oriented to person, place, and time. She appears well-developed and well-nourished.  HENT:  Head: Normocephalic and atraumatic.  Pulmonary/Chest: Effort normal and breath sounds normal. No respiratory distress.  Abdominal: Normal appearance. There is tenderness in the right lower quadrant. There is CVA tenderness (right). There is no guarding.  Neurological: She is alert and oriented to person, place, and time.  Skin: Skin is warm and dry.  Psychiatric: She has a normal mood and affect. Her behavior is normal.  Vitals reviewed.   Lab Results:  Recent Labs    03/03/18 0106  WBC 6.1  HGB 10.9*  HCT 33.3*  PLT 142*   BMET Recent Labs    03/03/18 0106  NA 137  K 4.3  CL 110  CO2 17*  GLUCOSE 99  BUN 48*  CREATININE 6.44*  CALCIUM 9.0   PT/INR No results for input(s): LABPROT, INR in the last 72 hours. ABG No results for input(s): PHART, HCO3 in the last 72 hours.  Invalid input(s): PCO2,  PO2  Studies/Results: Ct Renal Stone Study  Result Date: 03/03/2018 CLINICAL DATA:  Acute renal failure.  Bilateral flank pain EXAM: CT ABDOMEN AND PELVIS WITHOUT CONTRAST TECHNIQUE: Multidetector CT imaging of the abdomen and pelvis  was performed following the standard protocol without IV contrast. COMPARISON:  None. FINDINGS: Lower chest: Negative Hepatobiliary: Normal liver.  Gallbladder and bile ducts normal. Pancreas: Negative Spleen: Negative Adrenals/Urinary Tract: Numerous bilateral renal calculi. Most of these are small calculi. 5 x 8 mm calculus is in the left midpole CT calyx posteriorly. 6 x 8 mm calculus in the proximal right ureter causing obstruction and hydronephrosis Moderate hydronephrosis and hydroureter on the left. 6 x 7 mm stone distal left ureter. Stomach/Bowel: Negative for bowel obstruction. Negative for bowel mass or edema. Normal appendix. Vascular/Lymphatic: Minimal atherosclerotic disease in the aorta Reproductive: Hysterectomy. Tubal ligation on the right but not left. Other: Small amount of free fluid in the pelvis. Musculoskeletal: Disc degeneration L5-S1. No acute skeletal abnormality. IMPRESSION: Nephrocalcinosis with numerous small calculi in both kidneys. 5 x 8 mm obstructing stone right UPJ 6 x 7 mm   stone left UVJ. Small amount of free fluid in the pelvis of uncertain etiology. Electronically Signed   By: Marlan Palau M.D.   On: 03/03/2018 07:50   Labs and x-rays reviewed.  Case discussed with the EDP.   Assessment: Bilateral Ureteral stones with ARI.   I am going to take her for cystoscopy with bilateral ureteral stent insertion with subsequent bilateral ureteroscopy.    I have reviewed the risks and alternatives in detail.      CC: Dr. Kandis Mannan and Dr. Vallery Ridge (office number [phone])     Bjorn Pippin 03/03/2018 [phone]

## 2018-03-03 NOTE — Consult Note (Addendum)
Cardiology Consultation:   Patient ID: [name]; [id number]; 04/13/1976   Admit date: 03/03/2018 Date of Consult: 03/03/2018  Primary Care Provider: System, Pcp Not In Primary Cardiologist: New, was Dr Foster Simpson in [facility] Primary Electrophysiologist:  In [place]   Patient Profile:   [name] is a 42 y.o. female with a hx of Sjogren's syndrome, chronic hypokalemia, orthostatic hypotension, recent diagnosis of nephrolithiasis, who is being seen today for the evaluation of chest pain at the request of Dr. Annabell Howells.  History of Present Illness:   [name] and her family are moving down here from [place], [place].  She came early to start work.  She had problems with kidney stones a few weeks ago.  She then had another problem a few days ago and then it started again last night.  She has a history of PVCs and was very symptomatic from them.  As part of her evaluation for the PVCs as well as for the orthostatic hypotension, she has had extensive cardiac testing.  She has had 2 stress tests, a cardiac catheterization approximately 2 years ago that was clean, a tilt table test and 2 outpatient monitors.  To treat the orthostatic hypotension, she has liberalized salt intake, is on a low dose of metoprolol, and wears compression stockings daily.  To treat the hypokalemia, she takes potassium 40 mEq a day.  To treat the PVCs, she takes the beta-blocker and tries to keep her potassium above 3.5.  In general, she has been doing very well.  She was in [place] over the winter, and did not have any flares of her Sjogren's disease.  Her PVCs have been well controlled by the beta-blocker and the potassium supplementation.  She developed flank pain, back pain, nausea and vomiting.  She was admitted by urology for bilateral ureteral stones with acute renal insufficiency.  She was taken to the OR today for bilateral ureteral stents.  She tolerated the procedure well.  She was extubated according to  protocol.  As she was coming up from anesthesia, she noted that her flank pain and back pain had improved.  She then started noticing chest pressure, just to the left of her sternum.  It reached to 5/10.  She had low doses of morphine as well as sublingual nitroglycerin x2.  The pain is now a 3/10.  It has not been a 0/10 since it started, somewhere around 2:00.  She is not sure whether the nitro or the morphine made the most difference.  She now feels the pain only with deep inspiration.  Chest wall is not tender.  She had this only once before, several years ago.  It turned out to be a reaction to Cardizem which she has not had since.  At that time, the chest pain was a 10/10.  Today was not as severe.  Past Medical History:  Diagnosis Date  . Hypokalemia    chronic  . Orthostatic hypotension    Treated with metoprolol and compression stockings  . PVC (premature ventricular contraction)    treated with metoprolol and potassium supplementation  . Sjogren's syndrome [facility])     Past Surgical History:  Procedure Laterality Date  . ABDOMINAL HYSTERECTOMY     partial  . CESAREAN SECTION     x3     Prior to Admission medications   Medication Sig Start Date End Date Taking? Authorizing Provider  busPIRone (BUSPAR) 10 MG tablet Take 10 mg by mouth at bedtime as needed (for sleep).  Yes [provider]  Cholecalciferol (VITAMIN D3) 5000 units CAPS Take 5,000 Units by mouth daily.   Yes [provider]  diphenhydrAMINE (BENADRYL) 50 MG capsule Take 50 mg by mouth at bedtime as needed for sleep.   Yes [provider]  gabapentin (NEURONTIN) 600 MG tablet Take 600 mg by mouth at bedtime as needed (for pain).   Yes [provider]  Glucosamine HCl 1500 MG TABS Take 1,500 mg by mouth daily.   Yes [provider]  loratadine (CLARITIN) 10 MG tablet Take 10 mg by mouth daily.   Yes [provider]  LORazepam (ATIVAN) 0.5 MG tablet Take 0.5-1 mg  by mouth daily as needed for anxiety.   Yes [provider]  metoprolol succinate (TOPROL-XL) 25 MG 24 hr tablet Take 25 mg by mouth daily.   Yes [provider]  Multiple Vitamin (MULTIVITAMIN WITH MINERALS) TABS tablet Take 1 tablet by mouth daily.   Yes [provider]  mycophenolate (CELLCEPT) 250 MG capsule Take 250 mg by mouth 2 (two) times daily. Take with Cellcept 500 mg to equal 750 mg   Yes [provider]  mycophenolate (CELLCEPT) 500 MG tablet Take 500 mg by mouth 2 (two) times daily. Take with Cellcept 250 mg to equal 750 mg   Yes [provider]  omeprazole (PRILOSEC) 20 MG capsule Take 20 mg by mouth daily.   Yes [provider]  ondansetron (ZOFRAN) 8 MG tablet Take by mouth every 8 (eight) hours as needed for nausea or vomiting.   Yes [provider]  potassium chloride SA (K-DUR,KLOR-CON) 20 MEQ tablet Take 20 mEq by mouth 2 (two) times daily.   Yes [provider]  traMADol (ULTRAM) 50 MG tablet Take 50-100 mg by mouth every 8 (eight) hours as needed for moderate pain.   Yes [provider]  traZODone (DESYREL) 100 MG tablet Take 100 mg by mouth at bedtime as needed for sleep.    [provider]    Inpatient Medications: Scheduled Meds: . promethazine      . enoxaparin (LOVENOX) injection  30 mg Subcutaneous Q24H  . loratadine  10 mg Oral Daily  . meperidine      . metoprolol succinate  25 mg Oral Daily  . morphine      . mycophenolate  250 mg Oral BID  . mycophenolate  500 mg Oral BID  . pantoprazole  40 mg Oral Daily   Continuous Infusions: . 0.45 % NaCl with KCl 20 mEq / L    . sodium chloride    .  ceFAZolin (ANCEF) IV     PRN Meds: acetaminophen, bisacodyl, busPIRone, diphenhydrAMINE **OR** diphenhydrAMINE, fentaNYL (SUBLIMAZE) injection, gabapentin, LORazepam, meperidine (DEMEROL) injection, nitroGLYCERIN, ondansetron, senna-docusate, sodium phosphate, traMADol, traZODone,  zolpidem  Allergies:    Allergies  Allergen Reactions  . Cardizem [Diltiazem Hcl] Shortness Of Breath    And chest pain  . Hydromorphone Hcl Shortness Of Breath  . Tape Rash    Social History:   Social History   Socioeconomic History  . Marital status: Married    Spouse name: Not on file  . Number of children: Not on file  . Years of education: Not on file  . Highest education level: Not on file  Occupational History  . Occupation: Teacher, adult education: [employer]  Social Needs  . Financial resource strain: Not on file  . Food insecurity:    Worry: Not on file  Inability: Not on file  . Transportation needs:    Medical: Not on file    Non-medical: Not on file  Tobacco Use  . Smoking status: Never Smoker  . Smokeless tobacco: Never Used  Substance and Sexual Activity  . Alcohol use: Never    Frequency: Never  . Drug use: Never  . Sexual activity: Not on file  Lifestyle  . Physical activity:    Days per week: Not on file    Minutes per session: Not on file  . Stress: Not on file  Relationships  . Social connections:    Talks on phone: Not on file    Gets together: Not on file    Attends religious service: Not on file    Active member of club or organization: Not on file    Attends meetings of clubs or organizations: Not on file    Relationship status: Not on file  . Intimate partner violence:    Fear of current or ex partner: Not on file    Emotionally abused: Not on file    Physically abused: Not on file    Forced sexual activity: Not on file  Other Topics Concern  . Not on file  Social History Narrative  . Not on file    Family History:   Family History  Problem Relation Age of Onset  . Rheum arthritis Paternal Grandmother   . AAA (abdominal aortic aneurysm) Paternal Grandfather    Family Status:  Family Status  Relation Name Status  . Mother  Alive  . PGM  Deceased  . PGF  Deceased    ROS:  Please see the history of present illness.  All  other ROS reviewed and negative.     Physical Exam/Data:   Vitals:   03/03/18 1530 03/03/18 1545 03/03/18 1552 03/03/18 1600  BP: 114/66 102/65 105/81 113/62  Pulse: 81 83 84 82  Resp: Temp:      TempSrc:      SpO2: 97% 97% 96% 96%  Weight:      Height:        Intake/Output Summary (Last 24 hours) at 03/03/2018 1627 Last data filed at 03/03/2018 1330 Gross per 24 hour  Intake 600 ml  Output 1100 ml  Net -500 ml   Filed Weights   03/03/18 0103  Weight: 180 lb (81.6 kg)   Body mass index is 26.58 kg/m.  General:  Well nourished, well developed, in no acute distress HEENT: normal Lymph: no adenopathy Neck: no JVD Endocrine:  No thryomegaly Vascular: No carotid bruits; 4/4 extremity pulses 2+, without bruits  Cardiac:  normal S1, S2; RRR; no murmur  Lungs:  clear to auscultation bilaterally, no wheezing, rhonchi or rales  Abd: soft, nontender, no hepatomegaly  Ext: no edema Musculoskeletal:  No deformities, BUE and BLE strength normal and equal Skin: warm and dry  Neuro:  CNs 2-12 intact, no focal abnormalities noted Psych:  Normal affect   EKG:  The EKG was personally reviewed and demonstrates:  05/21, sinus rhythm, heart rate 77, no acute ischemic changes, T wave flattening in aVF and inverted T wave in lead III.  No old is available for comparison Telemetry:  Telemetry was personally reviewed and demonstrates: Sinus rhythm  Relevant CV Studies:  None in [place]  Laboratory Data:  Chemistry Recent Labs  Lab 03/03/18 0106  NA 137  K 4.3  CL 110  CO2 17*  GLUCOSE 99  BUN 48*  CREATININE 6.44*  CALCIUM 9.0  GFRNONAA 7*  GFRAA 8*  ANIONGAP 10    Lab Results  Component Value Date   ALT 12 (L) 03/03/2018   AST 15 03/03/2018   ALKPHOS 45 03/03/2018   BILITOT 0.5 03/03/2018   Hematology Recent Labs  Lab 03/03/18 0106 03/03/18 1522  WBC 6.1 6.1  RBC 3.62* 3.59*  HGB 10.9* 10.7*  HCT 33.3* 33.7*  MCV 92.0 93.9  MCH 30.1 29.8    MCHC 32.7 31.8  RDW 12.4 12.6  PLT 142* 155   Lab Results  Component Value Date   CKTOTAL 43 03/03/2018   CKMB 1.1 03/03/2018     Radiology/Studies:  Dg Chest Port 1 View  Result Date: 03/03/2018 CLINICAL DATA:  Chest pain. Bilateral ureteral stent placement today. EXAM: PORTABLE CHEST 1 VIEW COMPARISON:  None. FINDINGS: Normal heart size. Thickening of the right paratracheal stripe and mild prominence of the aortopulmonary window. Normal pulmonary vascularity. No focal consolidation, pleural effusion, or pneumothorax. No acute osseous abnormality. IMPRESSION: Thickening of the right paratracheal stripe and prominence of the aortopulmonary window could reflect underlying lymphadenopathy. Recommend contrast-enhanced chest CT for further evaluation. Electronically Signed   By: Obie Dredge M.D.   On: 03/03/2018 15:04   Dg C-arm 1-60 Min-no Report  Result Date: 03/03/2018 Fluoroscopy was utilized by the requesting physician.  No radiographic interpretation.   Ct Renal Stone Study  Result Date: 03/03/2018 CLINICAL DATA:  Acute renal failure.  Bilateral flank pain EXAM: CT ABDOMEN AND PELVIS WITHOUT CONTRAST TECHNIQUE: Multidetector CT imaging of the abdomen and pelvis was performed following the standard protocol without IV contrast. COMPARISON:  None. FINDINGS: Lower chest: Negative Hepatobiliary: Normal liver.  Gallbladder and bile ducts normal. Pancreas: Negative Spleen: Negative Adrenals/Urinary Tract: Numerous bilateral renal calculi. Most of these are small calculi. 5 x 8 mm calculus is in the left midpole CT calyx posteriorly. 6 x 8 mm calculus in the proximal right ureter causing obstruction and hydronephrosis Moderate hydronephrosis and hydroureter on the left. 6 x 7 mm stone distal left ureter. Stomach/Bowel: Negative for bowel obstruction. Negative for bowel mass or edema. Normal appendix. Vascular/Lymphatic: Minimal atherosclerotic disease in the aorta Reproductive: Hysterectomy.  Tubal ligation on the right but not left. Other: Small amount of free fluid in the pelvis. Musculoskeletal: Disc degeneration L5-S1. No acute skeletal abnormality. IMPRESSION: Nephrocalcinosis with numerous small calculi in both kidneys. 5 x 8 mm obstructing stone right UPJ 6 x 7 mm   stone left UVJ. Small amount of free fluid in the pelvis of uncertain etiology. Electronically Signed   By: Marlan Palau M.D.   On: 03/03/2018 07:50    Assessment and Plan:    Principal Problem: 1.  Ureteral stone with hydronephrosis - per Dr Annabell Howells - pt states she got ureteral stents, an additional procedure is needed to remove the stones. - doing well after surgery  Active Problems: 2.  Chest pain - sx were present when she woke up from the surgery - had a cath about 2 yr ago in [facility] that was clean - no hx exertional sx - initial CKMB neg, ECG not acute - rx pain, MD advise further eval  3.  AKI (acute kidney injury) (HCC) - Cr 6.44 on admission, now 4.68 - Per Dr Annabell Howells, getting hydration.   For questions or updates, please contact [facility] Please consult [email or web address] for contact info under Cardiology/STEMI.   Signed, Theodore Demark, PA-C  03/03/2018 4:27 PM  History and all data above  reviewed.  Patient examined.  I agree with the findings as above.  The patient presents with kidney stones and AKI.  She is status post ureteral stenting (procedure not pending).  She presented with chest pain.  This was not like pain that she had previously.  No objective evidence of ischemia.  Pain currently still present but mild.  The patient exam reveals COR:RRR  ,  Lungs: Clear  ,  Abd: Positive bowel sounds, no rebound no guarding, Ext No edema  .  All available labs, radiology testing, previous records reviewed. Agree with documented assessment and plan.   Chest pain:  Atypical.  NL coronaries by cath a few years ago.  No objective evidence of ischemia.  I do not suspect strongly a cardiac etiology.  Plan to  cycle enzymes.  If no further pain or no EKG or enzyme changes then no further cardiac work up.  We will follow.  We will obtain outside records.    Fayrene Fearing Aijah Lattner  5:24 PM  03/03/2018

## 2018-03-03 NOTE — Op Note (Signed)
Procedure: Cystoscopy with insertion of bilateral double-J stents.  Preop diagnosis: Right UPJ stone and left UVJ stone with obstruction and acute kidney injury.  Postop diagnosis: Same.  Surgeon: Dr. Bjorn Pippin.  Anesthesia: General.  Drains: Bilateral 6 French by 24 cm contour double-J stent and a Foley catheter.  Specimen: None.  EBL: None.  Complications: None.  Indications: The patient is a 42 year old white female who presented to the emergency room with right flank pain.  She was found to have bilateral obstructing ureteral stones with a creatinine of 6.  It was felt that bilateral ureteral stent insertion was indicated.  Procedure: She was given Ancef.  A general anesthetic was induced and she was placed in the lithotomy position.  She was fitted with PAS hose.  Her perineum and genitalia were prepped with Betadine solution and she was draped in usual sterile fashion.  Cystoscopy was performed using a 22 [place] scope with a 12 degree lens.  Examination revealed a normal urethra, the bladder wall was smooth and pale without tumor stones or inflammation and ureteral orifices were unremarkable.  The left ureteral orifice was cannulated with a 5 [place] open end catheter and a sensor guidewire was passed by the stone in the distal ureter which was approximately 7 x 11 mm.  Once the wire was in the kidney a 6 French 24 cm contour double-J stent without tether was passed over the wire to the kidney under fluoroscopic guidance.  The wire was removed leaving good coil in the kidney and a good coil in the bladder.  The right ureteral orifice was then cannulated with a 5 [place] open-ended catheter and a sensor guidewire was advanced to the 7 mm right UPJ stone.  The opening catheter was required to stiffen the wire to get by the stone which eventually popped back up in the kidney.  The wire was removed from the opening catheter and there was a brisk hydronephrotic drip confirming intrarenal  placement.  The wire was then reinserted and the opening catheter was removed.  A 6 French 24 cm contour double-J stent was then passed over the wire to the kidney under fluoroscopic guidance.  The wire was removed leaving a good coil in the kidney and a good coil in the bladder.  The cystoscope was removed and a 16 French Foley catheter was inserted.  The balloon was filled with 10 cc of sterile water.  The catheter was placed to straight drainage.  She was taken down from lithotomy position, her anesthetic was reversed and she was moved to recovery room in stable condition. there were no complications.

## 2018-03-03 NOTE — ED Notes (Signed)
Patient transported to CT 

## 2018-03-03 NOTE — ED Triage Notes (Signed)
Pt reports 3 episodes of R lower abdomen pain over the weekend, worse tonight. Some nausea, but improved with home zofran. Hx Sjogren's syndrome

## 2018-03-03 NOTE — Anesthesia Procedure Notes (Signed)
Procedure Name: Intubation Date/Time: 03/03/2018 12:19 PM Performed by: Shirlyn Goltz, CRNA Pre-anesthesia Checklist: Patient identified, Emergency Drugs available, Suction available and Patient being monitored Patient Re-evaluated:Patient Re-evaluated prior to induction Oxygen Delivery Method: Circle system utilized Preoxygenation: Pre-oxygenation with 100% oxygen Induction Type: IV induction, Rapid sequence and Cricoid Pressure applied Laryngoscope Size: Mac and 3 Grade View: Grade I Tube type: Oral Tube size: 7.0 mm Number of attempts: 1 Airway Equipment and Method: Stylet Placement Confirmation: ETT inserted through vocal cords under direct vision,  positive ETCO2 and breath sounds checked- equal and bilateral Secured at: 22 cm Tube secured with: Tape Dental Injury: Teeth and Oropharynx as per pre-operative assessment

## 2018-03-03 NOTE — ED Provider Notes (Signed)
Care assumed from Lyndel Safe at shift change with CT scan pending  In brief, this patient is a 42 year old female with a history of Sjogren's syndrome who presents emergency department today for intermittent flank pain with radiation to her lower abdomen. Patient states she has had this pain 4 times over the last 2 weeks. She notes that sometime the pain wakes her up and is in her left flank with radiation to her left lower abdomen with associated N/V. Today she awoke with right flank pain that radiated to her right lower abdominal pain with associated nausea, vomiting and dry heaving. She denies fever, diarrhea or urianary symptoms. N  Patient vital signs were reassuring.  She is without fever, tachycardia, tachypnea, hypoxia or hypotension.  Patient was found to have elevated creatinine of 6.44.  Ths is a significant change from her reported baseline. Patient is still producing urine. UA with large leukocytes and 21-50 wbc. No bacteria. Patient given IVF and pain medication.   Gen: afebrile, VSS HEENT: Atraumatic, EOMI Resp: no resp distress CV: RRR Abd: Appearance normal. No erythema, jaundice or ascites. Abdomen is soft. Mild TTP of the right lower quadrant to umbilicus. No rebound, rigidity or guarding. Bowel sounds are present in all four quadrants. No distension. CVA TTP on the right.  MsK: moving all extremities well Neuro: A&O x4  PLAN: CT renal stone study to rule out obstruction. Plan for nephro consult. Admit.  Results for orders placed or performed during the hospital encounter of 03/03/18  Lipase, blood  Result Value Ref Range   Lipase 38 11 - 51 U/L  Comprehensive metabolic panel  Result Value Ref Range   Sodium 137 135 - 145 mmol/L   Potassium 4.3 3.5 - 5.1 mmol/L   Chloride 110 101 - 111 mmol/L   CO2 17 (L) 22 - 32 mmol/L   Glucose, Bld 99 65 - 99 mg/dL   BUN 48 (H) 6 - 20 mg/dL   Creatinine, Ser 1.61 (H) 0.44 - 1.00 mg/dL   Calcium 9.0 8.9 - 09.6 mg/dL   Total  Protein 6.8 6.5 - 8.1 g/dL   Albumin 3.5 3.5 - 5.0 g/dL   AST 15 15 - 41 U/L   ALT 12 (L) 14 - 54 U/L   Alkaline Phosphatase 45 38 - 126 U/L   Total Bilirubin 0.5 0.3 - 1.2 mg/dL   GFR calc non Af Amer 7 (L) >60 mL/min   GFR calc Af Amer 8 (L) >60 mL/min   Anion gap 10 5 - 15  CBC  Result Value Ref Range   WBC 6.1 4.0 - 10.5 K/uL   RBC 3.62 (L) 3.87 - 5.11 MIL/uL   Hemoglobin 10.9 (L) 12.0 - 15.0 g/dL   HCT 04.5 (L) 40.9 - 81.1 %   MCV 92.0 78.0 - 100.0 fL   MCH 30.1 26.0 - 34.0 pg   MCHC 32.7 30.0 - 36.0 g/dL   RDW 91.4 78.2 - 95.6 %   Platelets 142 (L) 150 - 400 K/uL  Urinalysis, Routine w reflex microscopic  Result Value Ref Range   Color, Urine YELLOW YELLOW   APPearance CLEAR CLEAR   Specific Gravity, Urine 1.010 1.005 - 1.030   pH 6.0 5.0 - 8.0   Glucose, UA NEGATIVE NEGATIVE mg/dL   Hgb urine dipstick LARGE (A) NEGATIVE   Bilirubin Urine NEGATIVE NEGATIVE   Ketones, ur NEGATIVE NEGATIVE mg/dL   Protein, ur NEGATIVE NEGATIVE mg/dL   Nitrite NEGATIVE NEGATIVE   Leukocytes, UA LARGE (  A) NEGATIVE   RBC / HPF 0-5 0 - 5 RBC/hpf   WBC, UA 21-50 0 - 5 WBC/hpf   Bacteria, UA NONE SEEN NONE SEEN   Squamous Epithelial / LPF 0-5 0 - 5  CK  Result Value Ref Range   Total CK 51 38 - 234 U/L  I-Stat beta hCG blood, ED  Result Value Ref Range   I-stat hCG, quantitative <5.0 <5 mIU/mL   Comment 3           Ct Renal Stone Study  Result Date: 03/03/2018 CLINICAL DATA:  Acute renal failure.  Bilateral flank pain EXAM: CT ABDOMEN AND PELVIS WITHOUT CONTRAST TECHNIQUE: Multidetector CT imaging of the abdomen and pelvis was performed following the standard protocol without IV contrast. COMPARISON:  None. FINDINGS: Lower chest: Negative Hepatobiliary: Normal liver.  Gallbladder and bile ducts normal. Pancreas: Negative Spleen: Negative Adrenals/Urinary Tract: Numerous bilateral renal calculi. Most of these are small calculi. 5 x 8 mm calculus is in the left midpole CT calyx posteriorly.  6 x 8 mm calculus in the proximal right ureter causing obstruction and hydronephrosis Moderate hydronephrosis and hydroureter on the left. 6 x 7 mm stone distal left ureter. Stomach/Bowel: Negative for bowel obstruction. Negative for bowel mass or edema. Normal appendix. Vascular/Lymphatic: Minimal atherosclerotic disease in the aorta Reproductive: Hysterectomy. Tubal ligation on the right but not left. Other: Small amount of free fluid in the pelvis. Musculoskeletal: Disc degeneration L5-S1. No acute skeletal abnormality. IMPRESSION: Nephrocalcinosis with numerous small calculi in both kidneys. 5 x 8 mm obstructing stone right UPJ 6 x 7 mm   stone left UVJ. Small amount of free fluid in the pelvis of uncertain etiology. Electronically Signed   By: Marlan Palau M.D.   On: 03/03/2018 07:50    MDM:  42 year old female that has had intermittent flank pain with radiation into her abdomen over the last several weeks on both the left and right side.  She is presenting for right-sided flank pain with radiation into her abdomen today.  No fever or urinary symptoms.  Lab work is significant for a elevated creatinine of 6.44.  Patient is still producing urine.  UA with large leukocytes, large hemoglobin, 21-50 white blood cell, without any bacteria.  CT scan shows a 5 x 8 mm obstructing stone at the right UPJ, as well as a 6 x 7 mm stone at the left UVJ.  Will call urology.  Discussed with urology - Dr. Annabell Howells. He will see and admit the patient. Plan to stay at [facility].   1. Acute renal failure, unspecified acute renal failure type (HCC)   2. Bilateral kidney stones       Jacinto Halim, PA-C 03/03/18 1354    Gilda Crease, MD 03/12/18 [phone]

## 2018-03-03 NOTE — Progress Notes (Signed)
Patient requesting nausea and pain medication. Dr. Chilton Si notified states she wants to see patient prior to giving order patient notified.

## 2018-03-03 NOTE — H&P (Signed)
Subjective: CC:  Right flank pain.  Hx:  [name] is a 41 yo WF who I was asked to see in consultation by Dr. MacKuen for bilateral ureteral stones with ARI.   She had moderate left flank pain and nausea 4 weeks ago and then over the weekend she had right flank pain that was severe and recurred last night.  She has had some issues with urgency over the past couple of years but that hasn't gotten worse.  She has had no hematuria.  She has had no fever.  She had some chills Sunday night and last night.   She has had no prior stones and has only had one UTI.   She had a renal biopsy in 2016 for evaluation of Sjogren's syndrome.   She is on immunosuppressant for management of that.   She has a history of hypokalemia and is on potassium.   ROS:  Review of Systems  Constitutional: Positive for chills.  Gastrointestinal: Positive for abdominal pain, nausea and vomiting.  Genitourinary: Positive for flank pain and urgency.  All other systems reviewed and are negative.   Allergies  Allergen Reactions  . Hydromorphone Hcl Shortness Of Breath  . Tape Rash    Past Medical History:  Diagnosis Date  . Sjogren's syndrome (HCC)     Past Surgical History:  Procedure Laterality Date  . ABDOMINAL HYSTERECTOMY     partial  . CESAREAN SECTION     x3    Social History   Socioeconomic History  . Marital status: Married    Spouse name: Not on file  . Number of children: Not on file  . Years of education: Not on file  . Highest education level: Not on file  Occupational History  . Not on file  Social Needs  . Financial resource strain: Not on file  . Food insecurity:    Worry: Not on file    Inability: Not on file  . Transportation needs:    Medical: Not on file    Non-medical: Not on file  Tobacco Use  . Smoking status: Never Smoker  . Smokeless tobacco: Never Used  Substance and Sexual Activity  . Alcohol use: Never    Frequency: Never  . Drug use: Never  . Sexual activity: Not on  file  Lifestyle  . Physical activity:    Days per week: Not on file    Minutes per session: Not on file  . Stress: Not on file  Relationships  . Social connections:    Talks on phone: Not on file    Gets together: Not on file    Attends religious service: Not on file    Active member of club or organization: Not on file    Attends meetings of clubs or organizations: Not on file    Relationship status: Not on file  . Intimate partner violence:    Fear of current or ex partner: Not on file    Emotionally abused: Not on file    Physically abused: Not on file    Forced sexual activity: Not on file  Other Topics Concern  . Not on file  Social History Narrative  . Not on file    No family history on file.  Anti-infectives: Anti-infectives (From admission, onward)   None      No current facility-administered medications for this encounter.    Current Outpatient Medications  Medication Sig Dispense Refill  . busPIRone (BUSPAR) 10 MG tablet Take 10 mg by mouth   at bedtime as needed (for sleep).    . Cholecalciferol (VITAMIN D3) 5000 units CAPS Take 5,000 Units by mouth daily.    . diphenhydrAMINE (BENADRYL) 50 MG capsule Take 50 mg by mouth at bedtime as needed for sleep.    . gabapentin (NEURONTIN) 600 MG tablet Take 600 mg by mouth at bedtime as needed (for pain).    . Glucosamine HCl 1500 MG TABS Take 1,500 mg by mouth daily.    . loratadine (CLARITIN) 10 MG tablet Take 10 mg by mouth daily.    . LORazepam (ATIVAN) 0.5 MG tablet Take 0.5-1 mg by mouth daily as needed for anxiety.    . metoprolol succinate (TOPROL-XL) 25 MG 24 hr tablet Take 25 mg by mouth daily.    . Multiple Vitamin (MULTIVITAMIN WITH MINERALS) TABS tablet Take 1 tablet by mouth daily.    . mycophenolate (CELLCEPT) 250 MG capsule Take 250 mg by mouth 2 (two) times daily. Take with Cellcept 500 mg to equal 750 mg    . mycophenolate (CELLCEPT) 500 MG tablet Take 500 mg by mouth 2 (two) times daily. Take with  Cellcept 250 mg to equal 750 mg    . omeprazole (PRILOSEC) 20 MG capsule Take 20 mg by mouth daily.    . ondansetron (ZOFRAN) 8 MG tablet Take by mouth every 8 (eight) hours as needed for nausea or vomiting.    . potassium chloride SA (K-DUR,KLOR-CON) 20 MEQ tablet Take 20 mEq by mouth 2 (two) times daily.    . traMADol (ULTRAM) 50 MG tablet Take 50-100 mg by mouth every 8 (eight) hours as needed for moderate pain.    . traZODone (DESYREL) 100 MG tablet Take 100 mg by mouth at bedtime as needed for sleep.       Objective: Vital signs in last 24 hours: Temp:  [98.1 F (36.7 C)] 98.1 F (36.7 C) (05/21 0102) Pulse Rate:  [63-78] 66 (05/21 0800) Resp:  [14-22] 17 (05/21 0800) BP: (112-131)/(63-80) 122/71 (05/21 0800) SpO2:  [99 %-100 %] 99 % (05/21 0800) Weight:  [81.6 kg (180 lb)] 81.6 kg (180 lb) (05/21 0103)  Intake/Output from previous day: No intake/output data recorded. Intake/Output this shift: No intake/output data recorded.   Physical Exam  Constitutional: She is oriented to person, place, and time. She appears well-developed and well-nourished.  HENT:  Head: Normocephalic and atraumatic.  Pulmonary/Chest: Effort normal and breath sounds normal. No respiratory distress.  Abdominal: Normal appearance. There is tenderness in the right lower quadrant. There is CVA tenderness (right). There is no guarding.  Neurological: She is alert and oriented to person, place, and time.  Skin: Skin is warm and dry.  Psychiatric: She has a normal mood and affect. Her behavior is normal.  Vitals reviewed.   Lab Results:  Recent Labs    03/03/18 0106  WBC 6.1  HGB 10.9*  HCT 33.3*  PLT 142*   BMET Recent Labs    03/03/18 0106  NA 137  K 4.3  CL 110  CO2 17*  GLUCOSE 99  BUN 48*  CREATININE 6.44*  CALCIUM 9.0   PT/INR No results for input(s): LABPROT, INR in the last 72 hours. ABG No results for input(s): PHART, HCO3 in the last 72 hours.  Invalid input(s): PCO2,  PO2  Studies/Results: Ct Renal Stone Study  Result Date: 03/03/2018 CLINICAL DATA:  Acute renal failure.  Bilateral flank pain EXAM: CT ABDOMEN AND PELVIS WITHOUT CONTRAST TECHNIQUE: Multidetector CT imaging of the abdomen and pelvis   was performed following the standard protocol without IV contrast. COMPARISON:  None. FINDINGS: Lower chest: Negative Hepatobiliary: Normal liver.  Gallbladder and bile ducts normal. Pancreas: Negative Spleen: Negative Adrenals/Urinary Tract: Numerous bilateral renal calculi. Most of these are small calculi. 5 x 8 mm calculus is in the left midpole CT calyx posteriorly. 6 x 8 mm calculus in the proximal right ureter causing obstruction and hydronephrosis Moderate hydronephrosis and hydroureter on the left. 6 x 7 mm stone distal left ureter. Stomach/Bowel: Negative for bowel obstruction. Negative for bowel mass or edema. Normal appendix. Vascular/Lymphatic: Minimal atherosclerotic disease in the aorta Reproductive: Hysterectomy. Tubal ligation on the right but not left. Other: Small amount of free fluid in the pelvis. Musculoskeletal: Disc degeneration L5-S1. No acute skeletal abnormality. IMPRESSION: Nephrocalcinosis with numerous small calculi in both kidneys. 5 x 8 mm obstructing stone right UPJ 6 x 7 mm   stone left UVJ. Small amount of free fluid in the pelvis of uncertain etiology. Electronically Signed   By: Charles  Clark M.D.   On: 03/03/2018 07:50   Labs and x-rays reviewed.  Case discussed with the EDP.   Assessment: Bilateral Ureteral stones with ARI.   I am going to take her for cystoscopy with bilateral ureteral stent insertion with subsequent bilateral ureteroscopy.    I have reviewed the risks and alternatives in detail.      CC: Dr. Courtney MacKuen and Dr. Laura Morgan (office number [phone])     Teonia Yager 03/03/2018 [phone]  

## 2018-03-03 NOTE — Anesthesia Preprocedure Evaluation (Signed)
Anesthesia Evaluation  Patient identified by MRN, date of birth, ID band Patient awake    Reviewed: Allergy & Precautions, NPO status   Airway Mallampati: II  TM Distance: >3 FB     Dental   Pulmonary neg pulmonary ROS,    breath sounds clear to auscultation       Cardiovascular  Rhythm:Regular Rate:Normal     Neuro/Psych    GI/Hepatic negative GI ROS, Neg liver ROS,   Endo/Other    Renal/GU Renal disease     Musculoskeletal   Abdominal   Peds  Hematology   Anesthesia Other Findings   Reproductive/Obstetrics                             Anesthesia Physical Anesthesia Plan  ASA: III  Anesthesia Plan: General   Post-op Pain Management:    Induction: Intravenous, Cricoid pressure planned and Rapid sequence  PONV Risk Score and Plan: 3 and Treatment may vary due to age or medical condition, Midazolam, Dexamethasone and Ondansetron  Airway Management Planned: Oral ETT  Additional Equipment:   Intra-op Plan:   Post-operative Plan: Extubation in OR  Informed Consent: I have reviewed the patients History and Physical, chart, labs and discussed the procedure including the risks, benefits and alternatives for the proposed anesthesia with the patient or authorized representative who has indicated his/her understanding and acceptance.   Dental advisory given  Plan Discussed with: Anesthesiologist and CRNA  Anesthesia Plan Comments:         Anesthesia Quick Evaluation

## 2018-03-03 NOTE — Transfer of Care (Signed)
Immediate Anesthesia Transfer of Care Note  Patient: [name]  Procedure(s) Performed: CYSTOSCOPY WITH BILATERAL STENT PLACEMENT (Bilateral Bladder)  Patient Location: PACU  Anesthesia Type:General  Level of Consciousness: awake, alert , oriented and patient cooperative  Airway & Oxygen Therapy: Patient Spontanous Breathing and Patient connected to nasal cannula oxygen  Post-op Assessment: Report given to RN and Post -op Vital signs reviewed and stable  Post vital signs: Reviewed and stable  Last Vitals:  Vitals Value Taken Time  BP 126/76 03/03/2018  1:00 PM  Temp    Pulse 99 03/03/2018  1:02 PM  Resp 21 03/03/2018  1:02 PM  SpO2 99 % 03/03/2018  1:02 PM  Vitals shown include unvalidated device data.  Last Pain:  Vitals:   03/03/18 0820  TempSrc:   PainSc: 6          Complications: No apparent anesthesia complications

## 2018-03-03 NOTE — Anesthesia Postprocedure Evaluation (Signed)
Anesthesia Post Note  Patient: [name]  Procedure(s) Performed: CYSTOSCOPY WITH BILATERAL STENT PLACEMENT (Bilateral Bladder)     Patient location during evaluation: PACU Anesthesia Type: General Level of consciousness: awake Pain management: pain level controlled Vital Signs Assessment: post-procedure vital signs reviewed and stable Respiratory status: spontaneous breathing Cardiovascular status: stable Anesthetic complications: no    Last Vitals:  Vitals:   03/03/18 1552 03/03/18 1600  BP: 105/81 113/62  Pulse: 84 82  Resp: 19 19  Temp:    SpO2: 96% 96%    Last Pain:  Vitals:   03/03/18 1555  TempSrc:   PainSc: 2                  Lineth Thielke

## 2018-03-04 ENCOUNTER — Encounter (HOSPITAL_COMMUNITY): Payer: Self-pay | Admitting: Urology

## 2018-03-04 DIAGNOSIS — N132 Hydronephrosis with renal and ureteral calculous obstruction: Secondary | ICD-10-CM | POA: Diagnosis not present

## 2018-03-04 LAB — BASIC METABOLIC PANEL
Anion gap: 6 (ref 5–15)
BUN: 30 mg/dL — ABNORMAL HIGH (ref 6–20)
CALCIUM: 8.2 mg/dL — AB (ref 8.9–10.3)
CO2: 18 mmol/L — AB (ref 22–32)
CREATININE: 2.81 mg/dL — AB (ref 0.44–1.00)
Chloride: 118 mmol/L — ABNORMAL HIGH (ref 101–111)
GFR calc non Af Amer: 20 mL/min — ABNORMAL LOW (ref >60)
GFR, EST AFRICAN AMERICAN: 23 mL/min — AB (ref >60)
Glucose, Bld: 101 mg/dL — ABNORMAL HIGH (ref 65–99)
Potassium: 4.6 mmol/L (ref 3.5–5.1)
SODIUM: 142 mmol/L (ref 135–145)

## 2018-03-04 LAB — HIV ANTIBODY (ROUTINE TESTING W REFLEX): HIV SCREEN 4TH GENERATION: NONREACTIVE

## 2018-03-04 NOTE — Progress Notes (Signed)
Progress Note  Patient Name: [name] Date of Encounter: 03/04/2018  Primary Cardiologist:   No primary care provider on file.   Subjective   No chest pain.  No palpitations.   Inpatient Medications    Scheduled Meds: . heparin injection (subcutaneous)  5,000 Units Subcutaneous Q8H  . loratadine  10 mg Oral Daily  . metoprolol succinate  25 mg Oral Daily  . mycophenolate  750 mg Oral BID  . pantoprazole  40 mg Oral Daily   Continuous Infusions: . 0.45 % NaCl with KCl 20 mEq / L 100 mL/hr at 03/04/18 0432  . sodium chloride    .  ceFAZolin (ANCEF) IV Stopped (03/03/18 2355)   PRN Meds: acetaminophen, bisacodyl, diphenhydrAMINE **OR** diphenhydrAMINE, gabapentin, LORazepam, ondansetron, senna-docusate, sodium phosphate, traMADol, traZODone, zolpidem   Vital Signs    Vitals:   03/03/18 2034 03/03/18 2253 03/04/18 0523 03/04/18 0925  BP: 104/68  97/62 (!) 94/56  Pulse: 70  (!) 58 76  Resp: Temp: 98.9 F (37.2 C)  98.2 F (36.8 C) 98.2 F (36.8 C)  TempSrc: Oral  Oral Oral  SpO2: 96%  99% 100%  Weight:  186 lb 4.8 oz (84.5 kg)    Height:        Intake/Output Summary (Last 24 hours) at 03/04/2018 0954 Last data filed at 03/04/2018 0900 Gross per 24 hour  Intake 3191.67 ml  Output 3950 ml  Net -758.33 ml   Filed Weights   03/03/18 0103 03/03/18 2253  Weight: 180 lb (81.6 kg) 186 lb 4.8 oz (84.5 kg)    Telemetry    NSR, no ectopy - Personally Reviewed  ECG    NA - Personally Reviewed  Physical Exam   GEN: No acute distress.   Neck: No  JVD Cardiac: RRR, no murmurs, rubs, or gallops.  Respiratory: Clear  to auscultation bilaterally. GI: Soft, nontender, non-distended  MS: No  edema; No deformity. Neuro:  Nonfocal  Psych: Normal affect   Labs    Chemistry Recent Labs  Lab 03/03/18 0106 03/03/18 1522 03/04/18 0402  NA 137 142 142  K 4.3 4.5 4.6  CL 110 116* 118*  CO2 17* 16* 18*  GLUCOSE 99 89 101*  BUN 48* 40* 30*    CREATININE 6.44* 4.68* 2.81*  CALCIUM 9.0 8.5* 8.2*  PROT 6.8  --   --   ALBUMIN 3.5  --   --   AST 15  --   --   ALT 12*  --   --   ALKPHOS 45  --   --   BILITOT 0.5  --   --   GFRNONAA 7* 11* 20*  GFRAA 8* 12* 23*  ANIONGAP Hematology Recent Labs  Lab 03/03/18 0106 03/03/18 1522  WBC 6.1 6.1  RBC 3.62* 3.59*  HGB 10.9* 10.7*  HCT 33.3* 33.7*  MCV 92.0 93.9  MCH 30.1 29.8  MCHC 32.7 31.8  RDW 12.4 12.6  PLT 142* 155    Cardiac EnzymesNo results for input(s): TROPONINI in the last 168 hours. No results for input(s): TROPIPOC in the last 168 hours.   BNPNo results for input(s): BNP, PROBNP in the last 168 hours.   DDimer No results for input(s): DDIMER in the last 168 hours.   Radiology    Dg Chest Port 1 View  Result Date: 03/03/2018 CLINICAL DATA:  Chest pain. Bilateral ureteral stent placement today. EXAM: PORTABLE CHEST 1 VIEW  COMPARISON:  None. FINDINGS: Normal heart size. Thickening of the right paratracheal stripe and mild prominence of the aortopulmonary window. Normal pulmonary vascularity. No focal consolidation, pleural effusion, or pneumothorax. No acute osseous abnormality. IMPRESSION: Thickening of the right paratracheal stripe and prominence of the aortopulmonary window could reflect underlying lymphadenopathy. Recommend contrast-enhanced chest CT for further evaluation. Electronically Signed   By: Obie Dredge M.D.   On: 03/03/2018 15:04   Dg C-arm 1-60 Min-no Report  Result Date: 03/03/2018 Fluoroscopy was utilized by the requesting physician.  No radiographic interpretation.   Ct Renal Stone Study  Result Date: 03/03/2018 CLINICAL DATA:  Acute renal failure.  Bilateral flank pain EXAM: CT ABDOMEN AND PELVIS WITHOUT CONTRAST TECHNIQUE: Multidetector CT imaging of the abdomen and pelvis was performed following the standard protocol without IV contrast. COMPARISON:  None. FINDINGS: Lower chest: Negative Hepatobiliary: Normal liver.   Gallbladder and bile ducts normal. Pancreas: Negative Spleen: Negative Adrenals/Urinary Tract: Numerous bilateral renal calculi. Most of these are small calculi. 5 x 8 mm calculus is in the left midpole CT calyx posteriorly. 6 x 8 mm calculus in the proximal right ureter causing obstruction and hydronephrosis Moderate hydronephrosis and hydroureter on the left. 6 x 7 mm stone distal left ureter. Stomach/Bowel: Negative for bowel obstruction. Negative for bowel mass or edema. Normal appendix. Vascular/Lymphatic: Minimal atherosclerotic disease in the aorta Reproductive: Hysterectomy. Tubal ligation on the right but not left. Other: Small amount of free fluid in the pelvis. Musculoskeletal: Disc degeneration L5-S1. No acute skeletal abnormality. IMPRESSION: Nephrocalcinosis with numerous small calculi in both kidneys. 5 x 8 mm obstructing stone right UPJ 6 x 7 mm   stone left UVJ. Small amount of free fluid in the pelvis of uncertain etiology. Electronically Signed   By: Marlan Palau M.D.   On: 03/03/2018 07:50    Cardiac Studies   NA   Patient Profile    42 y.o. female   with a hx of Sjogren's syndrome, chronic hypokalemia, orthostatic hypotension, recent diagnosis of nephrolithiasis, who is being seen today for the evaluation of chest pain at the request of Dr. Annabell Howells.   Assessment & Plan    CHEST PAIN:  Atypical.  No objective evidence of ischemia.  No further symptoms since post up.  No further work up.    For questions or updates, please contact [facility] Please consult [email or web address] for contact info under Cardiology/STEMI.   Signed, Rollene Rotunda, MD  03/04/2018, 9:54 AM

## 2018-03-04 NOTE — Discharge Instructions (Signed)

## 2018-03-04 NOTE — Care Management Note (Signed)
Case Management Note  Patient Details  Name: Lynnmarie Lovett MRN: [id number] Date of Birth: October 02, 1976  Subjective/Objective:   Admitted for Uretal Stone with hydronephrosis.           Action/Plan: Patient with discharge orders home today.  NCM in to speak with patient and discussed NO PCP noted.  [place] and has not had time to find PCP yet.  NCM make the following suggestions: provided [facility] information, Provided Provider accepting new patients near her home and reminded patient she can contact her insurance company for recommendations also.  Patient verbalized understanding.  Prior to admission patient lived at home with spouse.  At discharge patient plans to return to same living situation.  At discharge patient has transportation home.  Patient can afford medications/food. No additional discharge needs needed.    Expected Discharge Date:  03/04/18               Expected Discharge Plan:  Home/Self Care  In-House Referral:  PCP / [facility]  Discharge planning Services  CM Consult  Status of Service:  Completed, signed off  Yancey Flemings, RN 03/04/2018, 10:14 AM

## 2018-03-04 NOTE — Discharge Summary (Signed)
Physician Discharge Summary  Patient ID: [name] MRN: [id number] DOB/AGE: [birth year] 42 y.o.  Admit date: 03/03/2018 Discharge date: 03/04/2018  Admission Diagnoses:  Ureteral stone with hydronephrosis  Discharge Diagnoses:  Principal Problem:   Ureteral stone with hydronephrosis Active Problems:   Chest pain   AKI (acute kidney injury) [facility])   Past Medical History:  Diagnosis Date  . Hypokalemia    chronic  . Orthostatic hypotension    Treated with metoprolol and compression stockings  . PVC (premature ventricular contraction)    treated with metoprolol and potassium supplementation  . Sjogren's syndrome (HCC)     Surgeries: Procedure(s): CYSTOSCOPY WITH BILATERAL STENT PLACEMENT on 03/03/2018   Consultants (if any): Treatment Team:  Bjorn Pippin, MD [facility], [place], MD  Discharged Condition: Improved  Hospital Course: [name] is an 42 y.o. female who was admitted 03/03/2018 with a diagnosis of Ureteral stone with hydronephrosis and AKI and went to the operating room on 03/03/2018 and underwent the above named procedures.  Her flank pain resolved with stent placement but she had CP in the PACU and was evaluated by cardiology.  The pain was not felt to be cardiac and subsequently resolved.  Her Cr has fallen from 6.44 on admission to 2.81 at D/C.  CK was normal at last check.  The foley and IV were removed this AM and she was felt to be ready for discharge.   She was given perioperative antibiotics:  Anti-infectives (From admission, onward)   Start     Dose/Rate Route Frequency Ordered Stop   03/03/18 2300  ceFAZolin (ANCEF) IVPB 1 g/50 mL premix     1 g 100 mL/hr over 30 Minutes Intravenous Every 12 hours 03/03/18 1248 03/04/18 2259   03/03/18 1145  ceFAZolin (ANCEF) IVPB 2g/100 mL premix     2 g 200 mL/hr over 30 Minutes Intravenous  Once 03/03/18 1008 03/03/18 1124    .  She was given sequential compression devices, early ambulation, and subcutaneous  heparin for DVT prophylaxis.  She benefited maximally from the hospital stay and there were no complications.    Recent vital signs:  Vitals:   03/03/18 2034 03/04/18 0523  BP: 104/68 97/62  Pulse: 70 (!) 58  Resp: 18 18  Temp: 98.9 F (37.2 C) 98.2 F (36.8 C)  SpO2: 96% 99%    Recent laboratory studies:  Lab Results  Component Value Date   HGB 10.7 (L) 03/03/2018   HGB 10.9 (L) 03/03/2018   Lab Results  Component Value Date   WBC 6.1 03/03/2018   PLT 155 03/03/2018   No results found for: INR Lab Results  Component Value Date   NA 142 03/04/2018   K 4.6 03/04/2018   CL 118 (H) 03/04/2018   CO2 18 (L) 03/04/2018   BUN 30 (H) 03/04/2018   CREATININE 2.81 (H) 03/04/2018   GLUCOSE 101 (H) 03/04/2018    Discharge Medications:   Allergies as of 03/04/2018      Reactions   Cardizem [diltiazem Hcl] Shortness Of Breath   And chest pain   Hydromorphone Hcl Shortness Of Breath   Tape Rash      Medication List    TAKE these medications   busPIRone 10 MG tablet Commonly known as:  BUSPAR Take 10 mg by mouth at bedtime as needed (for sleep).   diphenhydrAMINE 50 MG capsule Commonly known as:  BENADRYL Take 50 mg by mouth at bedtime as needed for sleep.   gabapentin 600 MG tablet Commonly  known as:  NEURONTIN Take 600 mg by mouth at bedtime as needed (for pain).   Glucosamine HCl 1500 MG Tabs Take 1,500 mg by mouth daily.   loratadine 10 MG tablet Commonly known as:  CLARITIN Take 10 mg by mouth daily.   LORazepam 0.5 MG tablet Commonly known as:  ATIVAN Take 0.5-1 mg by mouth daily as needed for anxiety.   metoprolol succinate 25 MG 24 hr tablet Commonly known as:  TOPROL-XL Take 25 mg by mouth daily.   multivitamin with minerals Tabs tablet Take 1 tablet by mouth daily.   mycophenolate 500 MG tablet Commonly known as:  CELLCEPT Take 500 mg by mouth 2 (two) times daily. Take with Cellcept 250 mg to equal 750 mg   mycophenolate 250 MG  capsule Commonly known as:  CELLCEPT Take 250 mg by mouth 2 (two) times daily. Take with Cellcept 500 mg to equal 750 mg   omeprazole 20 MG capsule Commonly known as:  PRILOSEC Take 20 mg by mouth daily.   ondansetron 8 MG tablet Commonly known as:  ZOFRAN Take by mouth every 8 (eight) hours as needed for nausea or vomiting.   potassium chloride SA 20 MEQ tablet Commonly known as:  K-DUR,KLOR-CON Take 20 mEq by mouth 2 (two) times daily.   traMADol 50 MG tablet Commonly known as:  ULTRAM Take 50-100 mg by mouth every 8 (eight) hours as needed for moderate pain.   traZODone 100 MG tablet Commonly known as:  DESYREL Take 100 mg by mouth at bedtime as needed for sleep.   Vitamin D3 5000 units Caps Take 5,000 Units by mouth daily.       Diagnostic Studies: Dg Chest Port 1 View  Result Date: 03/03/2018 CLINICAL DATA:  Chest pain. Bilateral ureteral stent placement today. EXAM: PORTABLE CHEST 1 VIEW COMPARISON:  None. FINDINGS: Normal heart size. Thickening of the right paratracheal stripe and mild prominence of the aortopulmonary window. Normal pulmonary vascularity. No focal consolidation, pleural effusion, or pneumothorax. No acute osseous abnormality. IMPRESSION: Thickening of the right paratracheal stripe and prominence of the aortopulmonary window could reflect underlying lymphadenopathy. Recommend contrast-enhanced chest CT for further evaluation. Electronically Signed   By: Obie Dredge M.D.   On: 03/03/2018 15:04   Dg C-arm 1-60 Min-no Report  Result Date: 03/03/2018 Fluoroscopy was utilized by the requesting physician.  No radiographic interpretation.   Ct Renal Stone Study  Result Date: 03/03/2018 CLINICAL DATA:  Acute renal failure.  Bilateral flank pain EXAM: CT ABDOMEN AND PELVIS WITHOUT CONTRAST TECHNIQUE: Multidetector CT imaging of the abdomen and pelvis was performed following the standard protocol without IV contrast. COMPARISON:  None. FINDINGS: Lower chest:  Negative Hepatobiliary: Normal liver.  Gallbladder and bile ducts normal. Pancreas: Negative Spleen: Negative Adrenals/Urinary Tract: Numerous bilateral renal calculi. Most of these are small calculi. 5 x 8 mm calculus is in the left midpole CT calyx posteriorly. 6 x 8 mm calculus in the proximal right ureter causing obstruction and hydronephrosis Moderate hydronephrosis and hydroureter on the left. 6 x 7 mm stone distal left ureter. Stomach/Bowel: Negative for bowel obstruction. Negative for bowel mass or edema. Normal appendix. Vascular/Lymphatic: Minimal atherosclerotic disease in the aorta Reproductive: Hysterectomy. Tubal ligation on the right but not left. Other: Small amount of free fluid in the pelvis. Musculoskeletal: Disc degeneration L5-S1. No acute skeletal abnormality. IMPRESSION: Nephrocalcinosis with numerous small calculi in both kidneys. 5 x 8 mm obstructing stone right UPJ 6 x 7 mm   stone left UVJ.  Small amount of free fluid in the pelvis of uncertain etiology. Electronically Signed   By: Marlan Palau M.D.   On: 03/03/2018 07:50    Disposition: Discharge disposition: 01-Home or Self Care       Discharge Instructions    Discharge patient   Complete by:  As directed    Discharge disposition:  01-Home or Self Care   Discharge patient date:  03/04/2018   Discontinue IV   Complete by:  As directed       Follow-up Information    Bjorn Pippin, MD Follow up.   Specialty:  Urology Why:  My office will call to arrange the next procedure.  Contact information: [address] [place] [address] [phone]            Signed: Bjorn Pippin 03/04/2018, 6:27 AM

## 2018-03-05 ENCOUNTER — Other Ambulatory Visit: Payer: Self-pay | Admitting: Urology

## 2018-03-16 ENCOUNTER — Encounter (HOSPITAL_BASED_OUTPATIENT_CLINIC_OR_DEPARTMENT_OTHER): Payer: Self-pay | Admitting: *Deleted

## 2018-03-16 ENCOUNTER — Other Ambulatory Visit: Payer: Self-pay

## 2018-03-16 NOTE — Progress Notes (Signed)
Spoke w/ pt via phone for pre-op interview.  Npo after mn.  Arrive at 0530.  Current lab results, dated 03-04-2018, and ekg in chart and epic.   Will take prilosec am dos w/ sips of water and if needed take tramadol/ zofran.

## 2018-03-17 ENCOUNTER — Ambulatory Visit (HOSPITAL_BASED_OUTPATIENT_CLINIC_OR_DEPARTMENT_OTHER): Payer: Managed Care, Other (non HMO) | Admitting: Anesthesiology

## 2018-03-17 ENCOUNTER — Encounter (HOSPITAL_BASED_OUTPATIENT_CLINIC_OR_DEPARTMENT_OTHER)
Admission: RE | Disposition: A | Discharge status: Home / Self Care | Payer: Self-pay | Source: Ambulatory Visit | Attending: Urology

## 2018-03-17 ENCOUNTER — Ambulatory Visit (HOSPITAL_BASED_OUTPATIENT_CLINIC_OR_DEPARTMENT_OTHER)
Admission: RE | Admit: 2018-03-17 | Discharge: 2018-03-17 | Disposition: A | Discharge status: Home / Self Care | Payer: Managed Care, Other (non HMO) | Source: Ambulatory Visit | Attending: Urology | Admitting: Urology

## 2018-03-17 ENCOUNTER — Encounter (HOSPITAL_BASED_OUTPATIENT_CLINIC_OR_DEPARTMENT_OTHER): Payer: Self-pay

## 2018-03-17 DIAGNOSIS — Z87891 Personal history of nicotine dependence: Secondary | ICD-10-CM | POA: Diagnosis not present

## 2018-03-17 DIAGNOSIS — N202 Calculus of kidney with calculus of ureter: Secondary | ICD-10-CM | POA: Insufficient documentation

## 2018-03-17 DIAGNOSIS — Z79899 Other long term (current) drug therapy: Secondary | ICD-10-CM | POA: Diagnosis not present

## 2018-03-17 DIAGNOSIS — M3500 Sicca syndrome, unspecified: Secondary | ICD-10-CM | POA: Insufficient documentation

## 2018-03-17 HISTORY — DX: Other constipation: K59.09

## 2018-03-17 HISTORY — PX: CYSTOSCOPY/URETEROSCOPY/HOLMIUM LASER/STENT PLACEMENT: SHX6546

## 2018-03-17 HISTORY — DX: Disorder of kidney and ureter, unspecified: N28.9

## 2018-03-17 HISTORY — DX: Other complications of anesthesia, initial encounter: T88.59XA

## 2018-03-17 HISTORY — DX: Adverse effect of unspecified anesthetic, initial encounter: T41.45XA

## 2018-03-17 HISTORY — DX: Gastro-esophageal reflux disease without esophagitis: K21.9

## 2018-03-17 HISTORY — DX: Other specified disorders of bladder: N32.89

## 2018-03-17 SURGERY — CYSTOSCOPY/URETEROSCOPY/HOLMIUM LASER/STENT PLACEMENT
Anesthesia: General | Laterality: Bilateral

## 2018-03-17 MED ORDER — OXYBUTYNIN CHLORIDE 5 MG PO TABS
ORAL_TABLET | ORAL | Status: AC
  Filled 2018-03-17: qty 1

## 2018-03-17 MED ORDER — SODIUM CHLORIDE 0.9% FLUSH
3.0000 mL | Freq: Two times a day (BID) | INTRAVENOUS | Status: DC
  Filled 2018-03-17: qty 3

## 2018-03-17 MED ORDER — MORPHINE SULFATE (PF) 2 MG/ML IV SOLN
2.0000 mg | INTRAVENOUS | Status: DC | PRN
  Filled 2018-03-17: qty 1

## 2018-03-17 MED ORDER — FENTANYL CITRATE (PF) 100 MCG/2ML IJ SOLN
25.0000 ug | INTRAMUSCULAR | Status: DC | PRN
  Administered 2018-03-17: 50 ug via INTRAVENOUS
  Administered 2018-03-17 (×2): 25 ug via INTRAVENOUS
  Filled 2018-03-17: qty 1

## 2018-03-17 MED ORDER — MIDAZOLAM HCL 2 MG/2ML IJ SOLN
INTRAMUSCULAR | Status: AC
  Filled 2018-03-17: qty 2

## 2018-03-17 MED ORDER — PHENAZOPYRIDINE HCL 200 MG PO TABS: 200.0000 mg | ORAL_TABLET | Freq: Three times a day (TID) | ORAL | 1 refills | Status: DC | PRN

## 2018-03-17 MED ORDER — ONDANSETRON HCL 4 MG/2ML IJ SOLN
INTRAMUSCULAR | Status: DC | PRN
  Administered 2018-03-17: 4 mg via INTRAVENOUS

## 2018-03-17 MED ORDER — LACTATED RINGERS IV SOLN
INTRAVENOUS | Status: DC
  Administered 2018-03-17: 06:00:00 via INTRAVENOUS
  Filled 2018-03-17: qty 1000

## 2018-03-17 MED ORDER — ACETAMINOPHEN 10 MG/ML IV SOLN
INTRAVENOUS | Status: AC
  Filled 2018-03-17: qty 100

## 2018-03-17 MED ORDER — PROMETHAZINE HCL 25 MG/ML IJ SOLN
INTRAMUSCULAR | Status: AC
  Filled 2018-03-17: qty 1

## 2018-03-17 MED ORDER — FENTANYL CITRATE (PF) 100 MCG/2ML IJ SOLN
INTRAMUSCULAR | Status: AC
  Filled 2018-03-17: qty 2

## 2018-03-17 MED ORDER — MIDAZOLAM HCL 5 MG/5ML IJ SOLN
INTRAMUSCULAR | Status: DC | PRN
  Administered 2018-03-17: 2 mg via INTRAVENOUS

## 2018-03-17 MED ORDER — OXYBUTYNIN CHLORIDE 5 MG PO TABS: 5.0000 mg | ORAL_TABLET | Freq: Three times a day (TID) | ORAL | 1 refills | Status: DC | PRN

## 2018-03-17 MED ORDER — ONDANSETRON HCL 4 MG/2ML IJ SOLN
INTRAMUSCULAR | Status: AC
  Filled 2018-03-17: qty 2

## 2018-03-17 MED ORDER — CEFAZOLIN SODIUM-DEXTROSE 2-4 GM/100ML-% IV SOLN
2.0000 g | INTRAVENOUS | Status: AC
  Administered 2018-03-17: 2 g via INTRAVENOUS
  Filled 2018-03-17: qty 100

## 2018-03-17 MED ORDER — ACETAMINOPHEN 650 MG RE SUPP
650.0000 mg | RECTAL | Status: DC | PRN
  Filled 2018-03-17: qty 1

## 2018-03-17 MED ORDER — PROMETHAZINE HCL 25 MG PO TABS: 25.0000 mg | ORAL_TABLET | Freq: Four times a day (QID) | ORAL | 1 refills | Status: DC | PRN

## 2018-03-17 MED ORDER — OXYBUTYNIN CHLORIDE 5 MG PO TABS
5.0000 mg | ORAL_TABLET | Freq: Once | ORAL | Status: AC
  Administered 2018-03-17: 5 mg via ORAL
  Filled 2018-03-17: qty 1

## 2018-03-17 MED ORDER — LIDOCAINE 2% (20 MG/ML) 5 ML SYRINGE
INTRAMUSCULAR | Status: DC | PRN
  Administered 2018-03-17: 100 mg via INTRAVENOUS

## 2018-03-17 MED ORDER — PHENAZOPYRIDINE HCL 200 MG PO TABS
200.0000 mg | ORAL_TABLET | Freq: Once | ORAL | Status: AC
  Administered 2018-03-17: 200 mg via ORAL
  Filled 2018-03-17: qty 1

## 2018-03-17 MED ORDER — ACETAMINOPHEN 325 MG PO TABS
650.0000 mg | ORAL_TABLET | ORAL | Status: DC | PRN
  Filled 2018-03-17: qty 2

## 2018-03-17 MED ORDER — PHENAZOPYRIDINE HCL 100 MG PO TABS
ORAL_TABLET | ORAL | Status: AC
  Filled 2018-03-17: qty 2

## 2018-03-17 MED ORDER — LIDOCAINE 2% (20 MG/ML) 5 ML SYRINGE
INTRAMUSCULAR | Status: AC
  Filled 2018-03-17: qty 5

## 2018-03-17 MED ORDER — OXYCODONE HCL 5 MG PO TABS
ORAL_TABLET | ORAL | Status: AC
  Filled 2018-03-17: qty 1

## 2018-03-17 MED ORDER — CEFAZOLIN SODIUM-DEXTROSE 2-4 GM/100ML-% IV SOLN
INTRAVENOUS | Status: AC
  Filled 2018-03-17: qty 100

## 2018-03-17 MED ORDER — SODIUM CHLORIDE 0.9% FLUSH
3.0000 mL | INTRAVENOUS | Status: DC | PRN
  Filled 2018-03-17: qty 3

## 2018-03-17 MED ORDER — DEXAMETHASONE SODIUM PHOSPHATE 10 MG/ML IJ SOLN
INTRAMUSCULAR | Status: DC | PRN
  Administered 2018-03-17: 10 mg via INTRAVENOUS

## 2018-03-17 MED ORDER — DEXAMETHASONE SODIUM PHOSPHATE 10 MG/ML IJ SOLN
INTRAMUSCULAR | Status: AC
Start: 2018-03-17 — End: ?
  Filled 2018-03-17: qty 1

## 2018-03-17 MED ORDER — OXYCODONE HCL 5 MG PO TABS
5.0000 mg | ORAL_TABLET | ORAL | Status: DC | PRN
  Filled 2018-03-17: qty 2

## 2018-03-17 MED ORDER — OXYCODONE HCL 5 MG PO TABS: 5.0000 mg | ORAL_TABLET | Freq: Four times a day (QID) | ORAL | 0 refills | Status: DC | PRN

## 2018-03-17 MED ORDER — OXYCODONE HCL 5 MG PO TABS
5.0000 mg | ORAL_TABLET | Freq: Once | ORAL | Status: AC | PRN
  Administered 2018-03-17: 5 mg via ORAL
  Filled 2018-03-17: qty 1

## 2018-03-17 MED ORDER — PROPOFOL 10 MG/ML IV BOLUS
INTRAVENOUS | Status: AC
Start: 2018-03-17 — End: ?
  Filled 2018-03-17: qty 40

## 2018-03-17 MED ORDER — PROMETHAZINE HCL 25 MG/ML IJ SOLN
6.2500 mg | INTRAMUSCULAR | Status: DC | PRN
  Filled 2018-03-17: qty 1

## 2018-03-17 MED ORDER — SODIUM CHLORIDE 0.9 % IV SOLN
250.0000 mL | INTRAVENOUS | Status: DC | PRN
  Filled 2018-03-17: qty 250

## 2018-03-17 MED ORDER — ACETAMINOPHEN 10 MG/ML IV SOLN
INTRAVENOUS | Status: DC | PRN
  Administered 2018-03-17: 1000 mg via INTRAVENOUS

## 2018-03-17 MED ORDER — OXYCODONE HCL 5 MG/5ML PO SOLN
5.0000 mg | Freq: Once | ORAL | Status: AC | PRN
  Filled 2018-03-17: qty 5

## 2018-03-17 MED ORDER — PROPOFOL 10 MG/ML IV BOLUS
INTRAVENOUS | Status: DC | PRN
  Administered 2018-03-17: 200 mg via INTRAVENOUS

## 2018-03-17 MED ORDER — FENTANYL CITRATE (PF) 100 MCG/2ML IJ SOLN
INTRAMUSCULAR | Status: DC | PRN
  Administered 2018-03-17 (×2): 50 ug via INTRAVENOUS

## 2018-03-17 SURGICAL SUPPLY — 27 items
BAG DRAIN URO-CYSTO SKYTR STRL (DRAIN) ×2 IMPLANT
BASKET STONE 1.7 NGAGE (UROLOGICAL SUPPLIES) ×2 IMPLANT
BASKET ZERO TIP NITINOL 2.4FR (BASKET) IMPLANT
CATH URET 5FR 28IN CONE TIP (BALLOONS)
CATH URET 5FR 28IN OPEN ENDED (CATHETERS) IMPLANT
CATH URET 5FR 70CM CONE TIP (BALLOONS) IMPLANT
CATH URET DUAL LUMEN 6-10FR 50 (CATHETERS) ×2 IMPLANT
CLOTH BEACON ORANGE TIMEOUT ST (SAFETY) ×2 IMPLANT
ELECT REM PT RETURN 9FT ADLT (ELECTROSURGICAL)
ELECTRODE REM PT RTRN 9FT ADLT (ELECTROSURGICAL) IMPLANT
FIBER LASER FLEXIVA 365 (UROLOGICAL SUPPLIES) IMPLANT
FIBER LASER TRAC TIP (UROLOGICAL SUPPLIES) ×2 IMPLANT
GLOVE SURG SS PI 8.0 STRL IVOR (GLOVE) ×2 IMPLANT
GOWN STRL REUS W/TWL XL LVL3 (GOWN DISPOSABLE) ×2 IMPLANT
GUIDEWIRE 0.038 PTFE COATED (WIRE) IMPLANT
GUIDEWIRE ANG ZIPWIRE 038X150 (WIRE) IMPLANT
GUIDEWIRE STR DUAL SENSOR (WIRE) ×4 IMPLANT
INFUSOR MANOMETER BAG 3000ML (MISCELLANEOUS) IMPLANT
IV NS IRRIG 3000ML ARTHROMATIC (IV SOLUTION) ×2 IMPLANT
KIT TURNOVER CYSTO (KITS) ×2 IMPLANT
MANIFOLD NEPTUNE II (INSTRUMENTS) ×2 IMPLANT
NS IRRIG 500ML POUR BTL (IV SOLUTION) ×2 IMPLANT
PACK CYSTO (CUSTOM PROCEDURE TRAY) ×2 IMPLANT
SHEATH URET ACCESS 12FR/35CM (UROLOGICAL SUPPLIES) ×2 IMPLANT
STENT URET 6FRX24 CONTOUR (STENTS) ×2 IMPLANT
TUBE CONNECTING 12X1/4 (SUCTIONS) ×2 IMPLANT
TUBING UROLOGY SET (TUBING) ×2 IMPLANT

## 2018-03-17 NOTE — Anesthesia Procedure Notes (Signed)
Procedure Name: LMA Insertion Date/Time: 03/17/2018 7:33 AM Performed by: Briant Sitesenenny, Navid Lenzen T, CRNA Pre-anesthesia Checklist: Patient identified, Emergency Drugs available, Suction available and Patient being monitored Patient Re-evaluated:Patient Re-evaluated prior to induction Oxygen Delivery Method: Circle system utilized Preoxygenation: Pre-oxygenation with 100% oxygen Induction Type: IV induction Ventilation: Mask ventilation without difficulty LMA: LMA with gastric port inserted LMA Size: 4.0 Number of attempts: 1 Placement Confirmation: positive ETCO2 Tube secured with: Tape Dental Injury: Teeth and Oropharynx as per pre-operative assessment

## 2018-03-17 NOTE — Anesthesia Postprocedure Evaluation (Signed)
Anesthesia Post Note  Patient: [name]  Procedure(s) Performed: CYSTOSCOPY BILATERAL URETEROSCOPY/HOLMIUM LASER/STENT PLACEMENT (Bilateral )     Patient location during evaluation: PACU Anesthesia Type: General Level of consciousness: awake and alert Pain management: pain level controlled Vital Signs Assessment: post-procedure vital signs reviewed and stable Respiratory status: spontaneous breathing, nonlabored ventilation and respiratory function stable Cardiovascular status: blood pressure returned to baseline and stable Postop Assessment: no apparent nausea or vomiting Anesthetic complications: no    Last Vitals:  Vitals:   03/17/18 0950 03/17/18 1000  BP:  115/61  Pulse: 91 88  Resp: 18 16  Temp:    SpO2: 96% 98%    Last Pain:  Vitals:   03/17/18 1000  TempSrc:   PainSc: 4                  Lowella CurbWarren Ray Gelila Well

## 2018-03-17 NOTE — Interval H&P Note (Signed)
History and Physical Interval Note:    03/17/2018 7:17 AM  [name]  has presented today for surgery, with the diagnosis of BILATERAL STONES RIGHT PROXIMAL AND LEFT DISTAL AND RENAL  The various methods of treatment have been discussed with the patient and family. After consideration of risks, benefits and other options for treatment, the patient has consented to  Procedure(s): CYSTOSCOPY BILATERAL URETEROSCOPY/HOLMIUM LASER/STENT PLACEMENT (Bilateral) as a surgical intervention .  The patient's history has been reviewed, patient examined, no change in status, stable for surgery.  I have reviewed the patient's chart and labs.  Questions were answered to the patient's satisfaction.     Bjorn PippinJohn Gerrit Mays

## 2018-03-17 NOTE — Anesthesia Preprocedure Evaluation (Signed)
Anesthesia Evaluation  Patient identified by MRN, date of birth, ID band Patient awake    Reviewed: Allergy & Precautions, NPO status   Airway Mallampati: II  TM Distance: >3 FB     Dental   Pulmonary neg pulmonary ROS, former smoker,    breath sounds clear to auscultation       Cardiovascular  Rhythm:Regular Rate:Normal     Neuro/Psych    GI/Hepatic negative GI ROS, Neg liver ROS,   Endo/Other    Renal/GU Renal disease     Musculoskeletal   Abdominal   Peds  Hematology   Anesthesia Other Findings   Reproductive/Obstetrics                             Anesthesia Physical  Anesthesia Plan  ASA: III  Anesthesia Plan: General   Post-op Pain Management:    Induction: Intravenous, Cricoid pressure planned and Rapid sequence  PONV Risk Score and Plan: 3 and Treatment may vary due to age or medical condition, Midazolam, Dexamethasone and Ondansetron  Airway Management Planned: Oral ETT  Additional Equipment:   Intra-op Plan:   Post-operative Plan: Extubation in OR  Informed Consent: I have reviewed the patients History and Physical, chart, labs and discussed the procedure including the risks, benefits and alternatives for the proposed anesthesia with the patient or authorized representative who has indicated his/her understanding and acceptance.   Dental advisory given  Plan Discussed with: Anesthesiologist and CRNA  Anesthesia Plan Comments:         Anesthesia Quick Evaluation

## 2018-03-17 NOTE — Transfer of Care (Signed)
Immediate Anesthesia Transfer of Care Note  Patient: [name]  Procedure(s) Performed: CYSTOSCOPY BILATERAL URETEROSCOPY/HOLMIUM LASER/STENT PLACEMENT (Bilateral )  Patient Location: PACU  Anesthesia Type:General  Level of Consciousness: awake, alert  and oriented  Airway & Oxygen Therapy: Patient Spontanous Breathing and Patient connected to nasal cannula oxygen  Post-op Assessment: Report given to RN  Post vital signs: Reviewed and stable  Last Vitals:  Vitals Value Taken Time  BP 119/67 03/17/2018  8:46 AM  Temp 36.9 C 03/17/2018  8:46 AM  Pulse 82 03/17/2018  8:46 AM  Resp 15 03/17/2018  8:46 AM  SpO2 100 % 03/17/2018  8:46 AM    Last Pain:  Vitals:   03/17/18 0551  TempSrc:   PainSc: 3       Patients Stated Pain Goal: 3 (03/17/18 0551)  Complications: No apparent anesthesia complications

## 2018-03-17 NOTE — Op Note (Signed)
Procedure: 1.  Cystoscopy with removal of bilateral double-J stents. 2.  Left ureteroscopy with holmium laser application and removal of left distal ureteral stone. 3.  Left flexible ureteroscopy with holmium laser application,  removal of left renal stones and insertion of left double-J stent. 4.  Right flexible ureteroscopy with holmium laser application, removal of right renal stones and insertion of right double-J stent.  Preoperative diagnosis: 1.  Left distal ureteral stone. 2.  Left renal stones. 3.  Right renal stone.  Postop diagnosis: Same.  Surgeon: Dr. Bjorn Pippin.  Anesthesia: General.  Specimen: Stone fragments.  Drains: Bilateral 6 French by 24 cm contour double-J stent with tether.  EBL: None.  Complications: None.  Indications: [name] is a 42 year old white female who originally presented with acute renal failure secondary to a left distal ureteral stone and a right proximal ureteral stone.  She underwent bilateral ureteral stenting and returns now for definitive stone management.  She also has a left renal stone that needs treatment.  Procedure: She was taken the operating room where she was given Ancef and a general anesthetic was induced.  She was placed in the lithotomy position and fitted with PAS hose.  She was prepped with Betadine solution and draped in usual sterile fashion.  Cystoscopy was performed using the 23 [place] scope and 30 degree lens.  The left ureteral stent loop was identified and grasped with a grasping forceps and pulled the urethral meatus.  A sensor guidewire was then passed to the kidney and the stent was removed.  The 6.5 French semirigid dual lumen ureteroscope was then passed alongside the wire in the left distal ureteral stone that measured 6 x 7 mm on CT was identified and engaged with a 200 m holmium laser fiber set on 1 W and 20 Hz.  The stone was broken and the manageable fragments which were then removed with the engage basket.  A  dual-lumen digital flexible ureteroscope was then passed alongside the wire to the kidney and approximately 8 mm stone was identified in the upper pole along with some smaller stones and an adjacent upper to mid pole calyx.  Ureteroscope was backed out and a 35 cm 12/14 digital access sheath was passed over the wire to the proximal ureter and the wire and inner core were removed.  The smaller stones were then removed with the engage basket.  The larger stone was engaged with the holmium laser on the same settings and broken and the manageable fragments which were then removed with the engage basket.  Once all of the larger fragments had been removed and inspection only revealed dust and fine grit, the ureteroscope was removed and a guidewire was passed back to the access sheath to the kidney.  The access sheath was gently removed.  I then used the ureteroscope and the engage basket to grasp the right distal ureteral stent loop and pulled that to the urethral meatus.  A sensor guidewire was then passed to the kidney and the flexible ureteroscope was then inserted alongside the wire.  Inspection of the collecting system revealed the 5 x 8 mm stone in the midpole calyx along with some smaller stones that appeared most consistent with Randall's plaques.  The larger stone was then engaged with the holmium laser and broken and the manageable fragments which were then removed with the engage basket.  Some of the small stones adherent to the Randall's plaques were sufficiently prominent to remove.  And these were removed with the  engage basket.  Final inspection revealed only dust and fine grit in the ureteroscope was removed.  A sensor wire was passed back to the kidney under fluoroscopic guidance and the access sheath was gently removed.  Bilateral 6 French by 24 cm contour double-J stent's with catheters were passed to the kidneys over the wires under fluoroscopic guidance and upon removal of the wires both formed  good coils in the kidneys and the bladder.  The cystoscope was then reinserted and the stone fragments from the distal ureteral stone were evacuated from the bladder.  Cystoscopic inspection revealed the stent was in good position.  The bladder was partially drained and the cystoscope was removed.  The stent strings were tied close to the meatus trimmed and tucked vaginally.  She was taken down from lithotomy position, her anesthetic was reversed and she was moved to recovery room in stable condition.  There were no complications.  Her husband was given the stone fragments to bring to the office at her follow-up visit on June 12.

## 2018-03-17 NOTE — Discharge Instructions (Addendum)
Ureteral Stent Implantation, Care After Refer to this sheet in the next few weeks. These instructions provide you with information about caring for yourself after your procedure. Your health care provider may also give you more specific instructions. Your treatment has been planned according to current medical practices, but problems sometimes occur. Call your health care provider if you have any problems or questions after your procedure. What can I expect after the procedure? After the procedure, it is common to have:  Nausea.  Mild pain when you urinate. You may feel this pain in your lower back or lower abdomen. Pain should stop within a few minutes after you urinate. This may last for up to 1 week.  A small amount of blood in your urine for several days.  Follow these instructions at home:  Medicines  Take over-the-counter and prescription medicines only as told by your health care provider.  If you were prescribed an antibiotic medicine, take it as told by your health care provider. Do not stop taking the antibiotic even if you start to feel better.  Do not drive for 24 hours if you received a sedative. Do not drive or operate heavy machinery while taking prescription pain medicines. Do not take any Tylenol until after 2:00 pm today.  Activity  Return to your normal activities as told by your health care provider. Ask your health care provider what activities are safe for you.  Do not lift anything that is heavier than 10 lb (4.5 kg). Follow this limit for 1 week after your procedure, or for as long as told by your health care provider. General instructions  Watch for any blood in your urine. Call your health care provider if the amount of blood in your urine increases.  If you have a catheter: ? Follow instructions from your health care provider about taking care of your catheter and collection bag. ? Do not take baths, swim, or use a hot tub until your health care provider  approves.  Drink enough fluid to keep your urine clear or pale yellow.  Keep all follow-up visits as told by your health care provider. This is important. Contact a health care provider if:  You have pain that gets worse or does not get better with medicine, especially pain when you urinate.  You have difficulty urinating.  You feel nauseous or you vomit repeatedly during a period of more than 2 days after the procedure. Get help right away if:  Your urine is dark red or has blood clots in it.  You are leaking urine (have incontinence).  The end of the stent comes out of your urethra.  You cannot urinate.  You have sudden, sharp, or severe pain in your abdomen or lower back.  You have a fever.  You may pull the stent by the attached string on Friday morning.   This information is not intended to replace advice given to you by your health care provider. Make sure you discuss any questions you have with your health care provider. Document Released: 06/02/2013 Document Revised: 03/07/2016 Document Reviewed: 04/14/2015 Elsevier Interactive Patient Education  2018 [employer].     Post Anesthesia Home Care Instructions  Activity: Get plenty of rest for the remainder of the day. A responsible individual must stay with you for 24 hours following the procedure.  For the next 24 hours, DO NOT: -Drive a car -Advertising copywriter -Drink alcoholic beverages -Take any medication unless instructed by your physician -Make any legal decisions or  sign important papers.  Meals: Start with liquid foods such as gelatin or soup. Progress to regular foods as tolerated. Avoid greasy, spicy, heavy foods. If nausea and/or vomiting occur, drink only clear liquids until the nausea and/or vomiting subsides. Call your physician if vomiting continues.  Special Instructions/Symptoms: Your throat may feel dry or sore from the anesthesia or the breathing tube placed in your throat during surgery. If  this causes discomfort, gargle with warm salt water. The discomfort should disappear within 24 hours.  If you had a scopolamine patch placed behind your ear for the management of post- operative nausea and/or vomiting:  1. The medication in the patch is effective for 72 hours, after which it should be removed.  Wrap patch in a tissue and discard in the trash. Wash hands thoroughly with soap and water. 2. You may remove the patch earlier than 72 hours if you experience unpleasant side effects which may include dry mouth, dizziness or visual disturbances. 3. Avoid touching the patch. Wash your hands with soap and water after contact with the patch.

## 2018-03-19 ENCOUNTER — Encounter (HOSPITAL_BASED_OUTPATIENT_CLINIC_OR_DEPARTMENT_OTHER): Payer: Self-pay | Admitting: Urology

## 2018-05-21 DIAGNOSIS — N200 Calculus of kidney: Secondary | ICD-10-CM | POA: Diagnosis not present

## 2018-05-27 DIAGNOSIS — N200 Calculus of kidney: Secondary | ICD-10-CM | POA: Diagnosis not present

## 2018-06-25 DIAGNOSIS — N200 Calculus of kidney: Secondary | ICD-10-CM | POA: Diagnosis not present

## 2018-07-02 DIAGNOSIS — N2589 Other disorders resulting from impaired renal tubular function: Secondary | ICD-10-CM | POA: Diagnosis not present

## 2018-07-02 DIAGNOSIS — E876 Hypokalemia: Secondary | ICD-10-CM | POA: Diagnosis not present

## 2018-07-02 DIAGNOSIS — M3500 Sicca syndrome, unspecified: Secondary | ICD-10-CM | POA: Diagnosis not present

## 2018-07-08 ENCOUNTER — Ambulatory Visit: Payer: BLUE CROSS/BLUE SHIELD | Admitting: Family Medicine

## 2018-07-08 ENCOUNTER — Encounter: Payer: Self-pay | Admitting: Family Medicine

## 2018-07-08 VITALS — BP 118/62 | HR 68 | Temp 98.6°F | Ht 70.0 in | Wt 191.8 lb

## 2018-07-08 DIAGNOSIS — O24419 Gestational diabetes mellitus in pregnancy, unspecified control: Secondary | ICD-10-CM | POA: Insufficient documentation

## 2018-07-08 DIAGNOSIS — J302 Other seasonal allergic rhinitis: Secondary | ICD-10-CM

## 2018-07-08 DIAGNOSIS — I951 Orthostatic hypotension: Secondary | ICD-10-CM | POA: Insufficient documentation

## 2018-07-08 DIAGNOSIS — K219 Gastro-esophageal reflux disease without esophagitis: Secondary | ICD-10-CM | POA: Diagnosis not present

## 2018-07-08 DIAGNOSIS — M3500 Sicca syndrome, unspecified: Secondary | ICD-10-CM | POA: Insufficient documentation

## 2018-07-08 DIAGNOSIS — F411 Generalized anxiety disorder: Secondary | ICD-10-CM

## 2018-07-08 DIAGNOSIS — E876 Hypokalemia: Secondary | ICD-10-CM | POA: Insufficient documentation

## 2018-07-08 DIAGNOSIS — F5101 Primary insomnia: Secondary | ICD-10-CM

## 2018-07-08 DIAGNOSIS — E559 Vitamin D deficiency, unspecified: Secondary | ICD-10-CM

## 2018-07-08 DIAGNOSIS — K5900 Constipation, unspecified: Secondary | ICD-10-CM | POA: Insufficient documentation

## 2018-07-08 HISTORY — DX: Gestational diabetes mellitus in pregnancy, unspecified control: O24.419

## 2018-07-08 MED ORDER — ONDANSETRON HCL 8 MG PO TABS: 8.0000 mg | ORAL_TABLET | Freq: Three times a day (TID) | ORAL | 0 refills | Status: DC | PRN

## 2018-07-08 MED ORDER — PANTOPRAZOLE SODIUM 40 MG PO TBEC: 40.0000 mg | DELAYED_RELEASE_TABLET | Freq: Two times a day (BID) | ORAL | 3 refills | Status: DC

## 2018-07-08 NOTE — Progress Notes (Signed)
[name] is a 42 y.o. female is here to [facility] CARE.   Patient Care Team: Helane Rima, DO as PCP - General (Family Medicine)   History of Present Illness:   [name], CMA acting as scribe for Dr. Helane Rima.   HPI: Patient in office to establish care. 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  Reflux:  She is interested in changing her omeprazole to something prescribed. She has tried the zantac 150mg  that she takes in addition to the omeprazole. The combination is not helping very much. Her heart burn has been constant from first flair of sjogern's. Her nephrologist would recommend her decreasing the PPI. We will send her to GI for evaluation if medications do not help.   She is planing on moving to [place] long term. She is married with three kids. All are in school. She has two boys 16 and 67 and a 4yr old daughter. [place] flair in Sjogern's.   Sjogren's Syndrome:she has appointment with rheumatology in November.   Nephrology: she does see urologist in [place] that she will continue to see for her sjogren's . She was seen by him last week.   She does take tramadol and Gabapentin for knee pain from horse injury.   She does take the Buspar and Ativan 0.5 as needed. She does not need refills on either today.    There are no preventive care reminders to display for this patient. Depression screen PHQ 2/9 07/12/2018  Decreased Interest 0  Down, Depressed, Hopeless 0  PHQ - 2 Score 0   PMHx, SurgHx, SocialHx, Medications, and Allergies were reviewed in the Visit Navigator and updated as appropriate.   Past Medical History:  Diagnosis Date  . Bladder spasms   . Chronic constipation   . Complication of anesthesia    03-03-2018 post-op sx, pt complain chest pain, testing negative and cardiologist consult, dr hochrien note in epic dated 05-21 and 03-04-2018 stating atypical chest pain, not cardiac  .  GERD (gastroesophageal reflux disease)   . Hypokalemia, chronic   . Orthostatic hypotension    per pt get orthostatic with changes of positions quickly --- treatment w/ metoprolol and compression stockings  . PVC (premature ventricular contraction)    intermittant PVC's , per pt symtomatic -- treated with metoprolol and potassium supplementation  . Renal insufficiency    secondary to sjogrens syndrome--- per pt followed by nephrologist in [place]  . Sjogren's syndrome (HCC)    rheumotologist-  per pt is in [place]     Past Surgical History:  Procedure Laterality Date  . ABDOMINAL HYSTERECTOMY     PARTIAL  . CESAREAN SECTION  x 3 LAST ONE 2012   W/ BILATERAL TUBLE LIGATION w/ last c/s  . CYSTOSCOPY WITH STENT PLACEMENT Bilateral 03/03/2018   Procedure: CYSTOSCOPY WITH BILATERAL STENT PLACEMENT;  Surgeon: Bjorn Pippin, MD;  Location: [facility] OR;  Service: Urology;  Laterality: Bilateral;  . CYSTOSCOPY/URETEROSCOPY/HOLMIUM LASER/STENT PLACEMENT Bilateral 03/17/2018   Procedure: CYSTOSCOPY BILATERAL URETEROSCOPY/HOLMIUM LASER/STENT PLACEMENT;  Surgeon: Bjorn Pippin, MD;  Location: [facility];  Service: Urology;  Laterality: Bilateral;     Family History  Problem Relation Age of Onset  . Breast cancer Mother   . Depression Mother   . Rheum arthritis Paternal Grandmother   . AAA (abdominal aortic aneurysm) Paternal Grandfather   . Heart disease Sister   . Lung cancer Maternal Grandfather  Social History   Tobacco Use  . Smoking status: Former Smoker    Years: 10.00    Types: Cigarettes    Last attempt to quit: 03/17/2007    Years since quitting: 11.3  . Smokeless tobacco: Never Used  Substance Use Topics  . Alcohol use: Not Currently    Frequency: Never  . Drug use: Never    Current Medications and Allergies:   .  B Complex-Biotin-FA (B COMPLETE PO), Take by mouth., Disp: , Rfl:  .  Cholecalciferol (VITAMIN D3) 5000 units CAPS, Take 5,000 Units by mouth daily., Disp:  , Rfl:  .  diphenhydrAMINE (BENADRYL) 50 MG capsule, Take 50 mg by mouth at bedtime as needed for sleep., Disp: , Rfl:  .  gabapentin (NEURONTIN) 300 MG capsule, Take 300 mg by mouth 3 (three) times daily., Disp: , Rfl:  .  Glucosamine HCl 1500 MG TABS, Take 1,500 mg by mouth daily., Disp: , Rfl:  .  loratadine (CLARITIN) 10 MG tablet, Take 10 mg by mouth every evening. , Disp: , Rfl:  .  LORazepam (ATIVAN) 0.5 MG tablet, Ativan 0.5 mg tablet  Take 2 tablets 3 times a day by oral route., Disp: , Rfl:  .  metoprolol succinate (TOPROL-XL) 25 MG 24 hr tablet, Take 25 mg by mouth every morning. , Disp: , Rfl:  .  Multiple Vitamin (MULTIVITAMIN WITH MINERALS) TABS tablet, Take 1 tablet by mouth every evening. , Disp: , Rfl:  .  mycophenolate (CELLCEPT) 250 MG capsule, Take 250 mg by mouth 2 (two) times daily. Take with Cellcept 500 mg to equal 750 mg, Disp: , Rfl:  .  mycophenolate (CELLCEPT) 500 MG tablet, Take 500 mg by mouth 2 (two) times daily. Take with Cellcept 250 mg to equal 750 mg, Disp: , Rfl:  .  omeprazole (PRILOSEC) 20 MG capsule, Take 20 mg by mouth every morning. , Disp: , Rfl:  .  ondansetron (ZOFRAN) 8 MG tablet, Take 1 tablet (8 mg total) by mouth every 8 (eight) hours as needed for nausea or vomiting., Disp: 20 tablet, Rfl: 0 .  oxybutynin (DITROPAN) 5 MG tablet, Take 1 tablet (5 mg total) by mouth 3 (three) times daily as needed for bladder spasms., Disp: 12 tablet, Rfl: 1 .  oxyCODONE (ROXICODONE) 5 MG immediate release tablet, Take 1 tablet (5 mg total) by mouth every 6 (six) hours as needed for moderate pain or severe pain., Disp: 15 tablet, Rfl: 0 .  phenazopyridine (PYRIDIUM) 200 MG tablet, Take 1 tablet (200 mg total) by mouth 3 (three) times daily as needed for pain., Disp: 10 tablet, Rfl: 1 .  Polyethylene Glycol 3350 (MIRALAX PO), Take by mouth daily as needed., Disp: , Rfl:  .  Potassium Chloride (KCL-20 PO), Take by mouth., Disp: , Rfl:  .  potassium chloride SA  (K-DUR,KLOR-CON) 20 MEQ tablet, Take 20 mEq by mouth 2 (two) times daily., Disp: , Rfl:  .  promethazine (PHENERGAN) 25 MG tablet, Take 1 tablet (25 mg total) by mouth every 6 (six) hours as needed for nausea or vomiting., Disp: 12 tablet, Rfl: 1 .  spironolactone (ALDACTONE) 25 MG tablet, Take 25 mg by mouth every morning., Disp: , Rfl:  .  traMADol (ULTRAM) 50 MG tablet, Take by mouth every 6 (six) hours as needed., Disp: , Rfl:  .  traZODone (DESYREL) 100 MG tablet, Take 100 mg by mouth at bedtime as needed for sleep., Disp: , Rfl:  .  Turmeric 500 MG CAPS, Take  500 mg by mouth daily., Disp: , Rfl:  .  pantoprazole (PROTONIX) 40 MG tablet, Take 1 tablet (40 mg total) by mouth 2 (two) times daily., Disp: 60 tablet, Rfl: 3   Allergies  Allergen Reactions  . Cardizem [Diltiazem Hcl] Shortness Of Breath    And chest pain  . Hydromorphone Hcl Shortness Of Breath  . Tape Rash   Review of Systems:   Pertinent items are noted in the HPI. Otherwise, ROS is negative.  Vitals:   Vitals:   07/08/18 0944  BP: 118/62  Pulse: 68  Temp: 98.6 F (37 C)  TempSrc: Oral  SpO2: 98%  Weight: 191 lb 12.8 oz (87 kg)  Height: 5\' 10"  (1.778 m)     Body mass index is 27.52 kg/m.  Physical Exam:   Physical Exam  Constitutional: She is oriented to person, place, and time. She appears well-developed and well-nourished. No distress.  HENT:  Head: Normocephalic and atraumatic.  Right Ear: External ear normal.  Left Ear: External ear normal.  Nose: Nose normal.  Mouth/Throat: Oropharynx is clear and moist.  Eyes: Pupils are equal, round, and reactive to light. Conjunctivae and EOM are normal.  Neck: Normal range of motion. Neck supple. No thyromegaly present.  Cardiovascular: Normal rate, regular rhythm, normal heart sounds and intact distal pulses.  Pulmonary/Chest: Effort normal and breath sounds normal.  Abdominal: Soft. Bowel sounds are normal.  Musculoskeletal: Normal range of motion.    Lymphadenopathy:    She has no cervical adenopathy.  Neurological: She is alert and oriented to person, place, and time.  Skin: Skin is warm and dry. Capillary refill takes less than 2 seconds.  Psychiatric: She has a normal mood and affect. Her behavior is normal.  Nursing note and vitals reviewed.  Assessment and Plan:   Ayriel was seen today for establish care.  Diagnoses and all orders for this visit:  Gastroesophageal reflux disease without esophagitis -     pantoprazole (PROTONIX) 40 MG tablet; Take 1 tablet (40 mg total) by mouth 2 (two) times daily.  Sjogren's syndrome, with unspecified organ involvement (HCC)  Chronic hypokalemia  Orthostatic hypotension  GAD (generalized anxiety disorder)  Primary insomnia  Vitamin D deficiency  Seasonal allergies  Other orders -     ondansetron (ZOFRAN) 8 MG tablet; Take 1 tablet (8 mg total) by mouth every 8 (eight) hours as needed for nausea or vomiting.   . Reviewed expectations re: course of current medical issues. . Discussed self-management of symptoms. . Outlined signs and symptoms indicating need for more acute intervention. . Patient verbalized understanding and all questions were answered. Marland Kitchen Health Maintenance issues including appropriate healthy diet, exercise, and smoking avoidance were discussed with patient. . See orders for this visit as documented in the electronic medical record. . Patient received an After Visit Summary.  CMA served as Neurosurgeon during this visit. History, Physical, and Plan performed by medical provider. The above documentation has been reviewed and is accurate and complete. Helane Rima, D.O.  Helane Rima, DO [place], [place] [facility] 07/12/2018

## 2018-07-09 DIAGNOSIS — N200 Calculus of kidney: Secondary | ICD-10-CM | POA: Diagnosis not present

## 2018-07-09 DIAGNOSIS — R8271 Bacteriuria: Secondary | ICD-10-CM | POA: Diagnosis not present

## 2018-07-09 DIAGNOSIS — N2589 Other disorders resulting from impaired renal tubular function: Secondary | ICD-10-CM | POA: Diagnosis not present

## 2018-07-12 ENCOUNTER — Encounter: Payer: Self-pay | Admitting: Family Medicine

## 2018-07-12 DIAGNOSIS — J302 Other seasonal allergic rhinitis: Secondary | ICD-10-CM | POA: Insufficient documentation

## 2018-07-12 DIAGNOSIS — E559 Vitamin D deficiency, unspecified: Secondary | ICD-10-CM | POA: Insufficient documentation

## 2018-07-12 DIAGNOSIS — F5101 Primary insomnia: Secondary | ICD-10-CM | POA: Insufficient documentation

## 2018-07-12 DIAGNOSIS — F411 Generalized anxiety disorder: Secondary | ICD-10-CM | POA: Insufficient documentation

## 2018-07-15 DIAGNOSIS — R110 Nausea: Secondary | ICD-10-CM | POA: Diagnosis not present

## 2018-07-15 DIAGNOSIS — N200 Calculus of kidney: Secondary | ICD-10-CM | POA: Diagnosis not present

## 2018-07-17 DIAGNOSIS — N200 Calculus of kidney: Secondary | ICD-10-CM | POA: Insufficient documentation

## 2018-07-29 DIAGNOSIS — B9620 Unspecified Escherichia coli [E. coli] as the cause of diseases classified elsewhere: Secondary | ICD-10-CM | POA: Diagnosis not present

## 2018-07-29 DIAGNOSIS — R300 Dysuria: Secondary | ICD-10-CM | POA: Diagnosis not present

## 2018-07-29 DIAGNOSIS — N200 Calculus of kidney: Secondary | ICD-10-CM | POA: Diagnosis not present

## 2018-07-29 DIAGNOSIS — N390 Urinary tract infection, site not specified: Secondary | ICD-10-CM | POA: Diagnosis not present

## 2018-07-30 ENCOUNTER — Other Ambulatory Visit: Payer: Self-pay | Admitting: Family Medicine

## 2018-07-30 MED ORDER — TRAZODONE HCL 100 MG PO TABS: 100.0000 mg | ORAL_TABLET | Freq: Every evening | ORAL | 1 refills | Status: DC | PRN

## 2018-07-30 NOTE — Telephone Encounter (Signed)
Copied from CRM [id number]. Topic: Quick Communication - See Telephone Encounter >> Jul 30, 2018  1:36 PM Windy Kalata, NT wrote: CRM for notification. See Telephone encounter for: 07/30/18.  Patient is requesting a refill on traZODone (DESYREL) 100 MG tablet  [id number] [place], [place] - [address] [place] [address] Phone: [phone] Fax: [phone]

## 2018-07-30 NOTE — Telephone Encounter (Signed)
Requested medication (s) are due for refill today - medication listed as historical  Requested medication (s) are on the active medication list -yes  Future visit scheduled -yes  Last refill: medication was active at hospital discharge  Notes to clinic: medication is listed as historical on medication list. Will need current # and Sig instructions for new Rx. Sent for provider review   Requested Prescriptions  Pending Prescriptions Disp Refills   traZODone (DESYREL) 100 MG tablet      Sig: Take 1 tablet (100 mg total) by mouth at bedtime as needed for sleep.     Psychiatry: Antidepressants - Serotonin Modulator Passed - 07/30/2018  1:38 PM      Passed - Valid encounter within last 6 months    Recent Outpatient Visits          3 weeks ago Gastroesophageal reflux disease without esophagitis   [facility] [place], [place], DO      Future Appointments            In 5 months Helane Rima, DO [facility] [place], [facility]            Requested Prescriptions  Pending Prescriptions Disp Refills   traZODone (DESYREL) 100 MG tablet      Sig: Take 1 tablet (100 mg total) by mouth at bedtime as needed for sleep.     Psychiatry: Antidepressants - Serotonin Modulator Passed - 07/30/2018  1:38 PM      Passed - Valid encounter within last 6 months    Recent Outpatient Visits          3 weeks ago Gastroesophageal reflux disease without esophagitis   [facility] [place], [place], DO      Future Appointments            In 5 months Helane Rima, DO [facility] [place], [facility]

## 2018-08-27 DIAGNOSIS — Z713 Dietary counseling and surveillance: Secondary | ICD-10-CM | POA: Diagnosis not present

## 2018-09-02 ENCOUNTER — Other Ambulatory Visit: Payer: Self-pay | Admitting: Family Medicine

## 2018-09-03 DIAGNOSIS — M3500 Sicca syndrome, unspecified: Secondary | ICD-10-CM | POA: Diagnosis not present

## 2018-09-03 LAB — CBC AND DIFFERENTIAL
HEMATOCRIT: 40 (ref 36–46)
HEMOGLOBIN: 13.4 (ref 12.0–16.0)
Neutrophils Absolute: 3
Platelets: 183 (ref 150–399)
WBC: 4

## 2018-09-03 LAB — BASIC METABOLIC PANEL
BUN: 18 (ref 4–21)
CREATININE: 1.1 (ref 0.5–1.1)
Glucose: 91
Potassium: 3.6 (ref 3.4–5.3)
SODIUM: 138 (ref 137–147)

## 2018-09-03 LAB — HEPATIC FUNCTION PANEL
AST: 16 (ref 13–35)
Alkaline Phosphatase: 57 (ref 25–125)
Bilirubin, Total: 0.4

## 2018-09-03 NOTE — Telephone Encounter (Signed)
Last seen 07/08/18  Last lab 03/04/18 was normal  Ok to fill?

## 2018-09-08 ENCOUNTER — Encounter: Payer: Self-pay | Admitting: Family Medicine

## 2018-10-19 ENCOUNTER — Encounter: Payer: Self-pay | Admitting: Family Medicine

## 2018-10-19 ENCOUNTER — Ambulatory Visit: Payer: Self-pay

## 2018-10-19 NOTE — Telephone Encounter (Signed)
Pt called stating that she has Sjogrens syndrome.  She explained that it causes her potassium to be low and she starts to develop leg cramping and palpitations.  She takes oral potassium twice a day She also has left flank pain and lower back pain.  She is concerned that she may have kidney stones. She denies fever and other symptoms. She rates her leg pain at 10 when cramping. Per protocol pt will be see tomorrow AM for her symptoms.  No appointments available today.  Pt is out of town working today and states she does travel for 3 hours for her job. Pt understands that she was to be seen today and states that if anything gets worse she will go to urgent care or ED for evaluation. Care advice read to patient.  Pt verbalized understanding of all instructions.  Reason for Disposition . Recent long-distance travel with prolonged time in car, bus, plane, or train (i.e., within past 2 weeks; 6 or  more hours duration)  Answer Assessment - Initial Assessment Questions 1. ONSET: "When did the pain start?"      Been on and off for 2 weeks 2. LOCATION: "Where is the pain located?"      Both legs 3. PAIN: "How bad is the pain?"    (Scale 1-10; or mild, moderate, severe)   -  MILD (1-3): doesn't interfere with normal activities    -  MODERATE (4-7): interferes with normal activities (e.g., work or school) or awakens from sleep, limping    -  SEVERE (8-10): excruciating pain, unable to do any normal activities, unable to walk     10 when cramping 4. WORK OR EXERCISE: "Has there been any recent work or exercise that involved this part of the body?"      No 5. CAUSE: "What do you think is causing the leg pain?"     Sjurgren syndrom 6. OTHER SYMPTOMS: "Do you have any other symptoms?" (e.g., chest pain, back pain, breathing difficulty, swelling, rash, fever, numbness, weakness)     Flan pain left side palpatations low back pain 7. PREGNANCY: "Is there any chance you are pregnant?" "When was your last  menstrual period?"     No hysterectomy  Protocols used: LEG PAIN-A-AH

## 2018-10-19 NOTE — Telephone Encounter (Signed)
See note

## 2018-10-19 NOTE — Telephone Encounter (Signed)
Noted  

## 2018-10-20 ENCOUNTER — Encounter: Payer: Self-pay | Admitting: Physician Assistant

## 2018-10-20 ENCOUNTER — Ambulatory Visit: Payer: Commercial Managed Care - PPO | Admitting: Physician Assistant

## 2018-10-20 ENCOUNTER — Telehealth: Payer: Self-pay | Admitting: Physician Assistant

## 2018-10-20 VITALS — BP 110/80 | HR 62 | Temp 98.0°F | Ht 70.0 in | Wt 197.2 lb

## 2018-10-20 DIAGNOSIS — R002 Palpitations: Secondary | ICD-10-CM | POA: Diagnosis not present

## 2018-10-20 DIAGNOSIS — R109 Unspecified abdominal pain: Secondary | ICD-10-CM | POA: Diagnosis not present

## 2018-10-20 DIAGNOSIS — M545 Low back pain, unspecified: Secondary | ICD-10-CM

## 2018-10-20 LAB — POCT URINALYSIS DIPSTICK
Bilirubin, UA: NEGATIVE
Glucose, UA: NEGATIVE
KETONES UA: NEGATIVE
Leukocytes, UA: NEGATIVE
NITRITE UA: NEGATIVE
PH UA: 7 (ref 5.0–8.0)
PROTEIN UA: NEGATIVE
RBC UA: NEGATIVE
Spec Grav, UA: 1.015 (ref 1.010–1.025)
UROBILINOGEN UA: 0.2 U/dL

## 2018-10-20 LAB — CBC WITH DIFFERENTIAL/PLATELET
BASOS ABS: 0 10*3/uL (ref 0.0–0.1)
BASOS PCT: 0.5 % (ref 0.0–3.0)
EOS ABS: 0 10*3/uL (ref 0.0–0.7)
Eosinophils Relative: 0.5 % (ref 0.0–5.0)
HCT: 39.6 % (ref 36.0–46.0)
HEMOGLOBIN: 13.2 g/dL (ref 12.0–15.0)
LYMPHS PCT: 14.8 % (ref 12.0–46.0)
Lymphs Abs: 0.5 10*3/uL — ABNORMAL LOW (ref 0.7–4.0)
MCHC: 33.4 g/dL (ref 30.0–36.0)
MCV: 92.4 fl (ref 78.0–100.0)
Monocytes Absolute: 0.4 10*3/uL (ref 0.1–1.0)
Monocytes Relative: 10.2 % (ref 3.0–12.0)
Neutro Abs: 2.7 10*3/uL (ref 1.4–7.7)
Neutrophils Relative %: 74 % (ref 43.0–77.0)
Platelets: 147 10*3/uL — ABNORMAL LOW (ref 150.0–400.0)
RBC: 4.29 Mil/uL (ref 3.87–5.11)
RDW: 13 % (ref 11.5–15.5)
WBC: 3.6 10*3/uL — AB (ref 4.0–10.5)

## 2018-10-20 LAB — BASIC METABOLIC PANEL
BUN: 23 mg/dL (ref 6–23)
CHLORIDE: 107 meq/L (ref 96–112)
CO2: 24 mEq/L (ref 19–32)
CREATININE: 1.17 mg/dL (ref 0.40–1.20)
Calcium: 9.6 mg/dL (ref 8.4–10.5)
GFR: 54.05 mL/min — AB (ref >60.00)
Glucose, Bld: 100 mg/dL — ABNORMAL HIGH (ref 70–99)
Potassium: 4.5 mEq/L (ref 3.5–5.1)
Sodium: 136 mEq/L (ref 135–145)

## 2018-10-20 LAB — TSH: TSH: 1.65 u[IU]/mL (ref 0.35–4.50)

## 2018-10-20 NOTE — Telephone Encounter (Signed)
See note

## 2018-10-20 NOTE — Telephone Encounter (Signed)
Sent pt a Mychart message with response.

## 2018-10-20 NOTE — Telephone Encounter (Signed)
Copied from CRM [id number]. Topic: Quick Communication - See Telephone Encounter >> Oct 20, 2018 10:37 AM Angela Nevin wrote: CRM for notification. See Telephone encounter for: 10/20/18.  Patient called inquiring as to why she needs EKG. Please advise.

## 2018-10-20 NOTE — Telephone Encounter (Signed)
I received a call from the [facility] and was told that the patient "refused to have a message taken ands demanded to speak with clinical staff about her EKG and why it was needed." I went to speak with clinical staff who were busy with patients and answered the call from the [facility]. I explained to the patient that she would get a call by the end of today however, clinical staff was not available at this time. She stated that she read the mychart message and it did not answer her question.    I explained again that clinical staff would call by the end of today and she said okay she understood.   Please see message below in regards to message from [place]:   Hi [name],  [facility] all is well. It looks like you were seen today for palpitations. Per protocol you have to have an EKG done to make sure your heart is okay.   Hope this helps!    Last read by [name] at 1:02 PM on 10/20/2018.

## 2018-10-20 NOTE — Progress Notes (Signed)
[name] is a 43 y.o. female here for a follow up of a pre-existing problem.  I acted as a Neurosurgeonscribe for Energy East CorporationSamantha Aveon Colquhoun, PA-C [name], LPN History of Present Illness:   Chief Complaint  Patient presents with  . Palpitations  . Left flank pain    Palpitations   This is a chronic problem. Episode onset: Started off and on past few weeks. The problem occurs intermittently. The problem has been gradually worsening. On average, each episode lasts 10 seconds. The symptoms are aggravated by caffeine. Associated symptoms include an irregular heartbeat, malaise/fatigue, nausea and shortness of breath. Pertinent negatives include no anxiety, chest fullness, chest pain, diaphoresis, dizziness, fever, numbness or vomiting. Associated symptoms comments: Leg cramps. She has tried beta blockers (Took extra dose of K+ for 2 days) for the symptoms. Risk factors include diabetes mellitus.  Flank Pain  This is a new problem. Episode onset: Started 2 days. The problem occurs intermittently. The problem has been gradually worsening since onset. The pain is present in the lumbar spine (Left flank). The quality of the pain is described as aching, burning and cramping (worse in the evening). The pain does not radiate. The pain is at a severity of 6/10. The pain is worse during the night. Stiffness is present in the morning. Pertinent negatives include no chest pain, dysuria, fever or numbness. (Frequency with urination on "Sunday and went in small amounts) She has tried heat (Aleve) for the symptoms. The treatment provided mild relief.    She recently moved. She currently has a urologist, Dr. Wrenn. She drives back home to see her nephrologist currently. She had a cardiologist but doesn't not have one locally.  She drinks about 2 cups of caffeine daily. May have had an increase over the holidays.  Past Medical History:  Diagnosis Date  . Bladder spasms   . Chronic constipation   . Complication of anesthesia     05" -21-2019 post-op sx, pt complain chest pain, testing negative and cardiologist consult, dr hochrien note in epic dated 05-21 and 03-04-2018 stating atypical chest pain, not cardiac  . GERD (gastroesophageal reflux disease)   . Hypokalemia, chronic   . Orthostatic hypotension    per pt get orthostatic with changes of positions quickly --- treatment w/ metoprolol and compression stockings  . PVC (premature ventricular contraction)    intermittant PVC's , per pt symtomatic -- treated with metoprolol and potassium supplementation  . Renal insufficiency    secondary to sjogrens syndrome--- [place]  . Sjogren's syndrome (HCC)    rheumotologist-  [place] in [place]     Social History   Socioeconomic History  . Marital status: Married    Spouse name: Not on file  . Number of children: Not on file  . Years of education: Not on file  . Highest education level: Not on file  Occupational History  . Occupation: Teacher, adult educationN    Employer: [employer]  Social Needs  . Financial resource strain: Not on file  . Food insecurity:    Worry: Not on file    Inability: Not on file  . Transportation needs:    Medical: Not on file    Non-medical: Not on file  Tobacco Use  . Smoking status: Former Smoker    Years: 10.00    Types: Cigarettes    Last attempt to quit: 03/17/2007    Years since quitting: 11.6  . Smokeless tobacco: Never Used  Substance and Sexual  Activity  . Alcohol use: Not Currently    Frequency: Never  . Drug use: Never  . Sexual activity: Not on file  Lifestyle  . Physical activity:    Days per week: Not on file    Minutes per session: Not on file  . Stress: Not on file  Relationships  . Social connections:    Talks on phone: Not on file    Gets together: Not on file    Attends religious service: Not on file    Active member of club or organization: Not on file    Attends meetings of clubs or organizations: Not on file    Relationship status: Not  on file  . Intimate partner violence:    Fear of current or ex partner: Not on file    Emotionally abused: Not on file    Physically abused: Not on file    Forced sexual activity: Not on file  Other Topics Concern  . Not on file  Social History Narrative  . Not on file    Past Surgical History:  Procedure Laterality Date  . ABDOMINAL HYSTERECTOMY     PARTIAL  . CESAREAN SECTION  x 3 LAST ONE 2012   W/ BILATERAL TUBLE LIGATION w/ last c/s  . CYSTOSCOPY WITH STENT PLACEMENT Bilateral 03/03/2018   Procedure: CYSTOSCOPY WITH BILATERAL STENT PLACEMENT;  Surgeon: Bjorn Pippin, MD;  Location: [facility] OR;  Service: Urology;  Laterality: Bilateral;  . CYSTOSCOPY/URETEROSCOPY/HOLMIUM LASER/STENT PLACEMENT Bilateral 03/17/2018   Procedure: CYSTOSCOPY BILATERAL URETEROSCOPY/HOLMIUM LASER/STENT PLACEMENT;  Surgeon: Bjorn Pippin, MD;  Location: [facility];  Service: Urology;  Laterality: Bilateral;    Family History  Problem Relation Age of Onset  . Breast cancer Mother   . Depression Mother   . Rheum arthritis Paternal Grandmother   . AAA (abdominal aortic aneurysm) Paternal Grandfather   . Heart disease Sister   . Lung cancer Maternal Grandfather     Allergies  Allergen Reactions  . Cardizem [Diltiazem Hcl] Shortness Of Breath    And chest pain  . Citalopram Other (See Comments)    Has reaction to ALL SSRI's per patient cause suicidal ideation  . Hydromorphone Hcl Shortness Of Breath  . Lexapro [Escitalopram Oxalate] Other (See Comments)    Has reaction to ALL SSRI's per patient cause suicidal ideation  . Paxil [Paroxetine Hcl] Other (See Comments)    Has reaction to ALL SSRI's per patient cause suicidal ideation  . Prozac [Fluoxetine] Other (See Comments)    Has reaction to ALL SSRI's per patient cause suicidal ideation  . Zoloft [Sertraline] Other (See Comments)    Has reaction to ALL SSRI's per patient cause suicidal ideation  . Tape Rash    Current Medications:    Current Outpatient Medications:  .  B Complex-Biotin-FA (B COMPLETE PO), Take by mouth., Disp: , Rfl:  .  Cholecalciferol (VITAMIN D3) 5000 units CAPS, Take 5,000 Units by mouth daily., Disp: , Rfl:  .  diphenhydrAMINE (BENADRYL) 50 MG capsule, Take 50 mg by mouth at bedtime as needed for sleep., Disp: , Rfl:  .  gabapentin (NEURONTIN) 300 MG capsule, Take 300 mg by mouth 3 (three) times daily., Disp: , Rfl:  .  Glucosamine HCl 1500 MG TABS, Take 1,500 mg by mouth daily., Disp: , Rfl:  .  loratadine (CLARITIN) 10 MG tablet, Take 10 mg by mouth every evening. , Disp: , Rfl:  .  LORazepam (ATIVAN) 0.5 MG tablet, Ativan 0.5 mg tablet  Take 2  tablets 3 times a day by oral route., Disp: , Rfl:  .  metoprolol succinate (TOPROL-XL) 25 MG 24 hr tablet, Take 25 mg by mouth every morning. , Disp: , Rfl:  .  Multiple Vitamin (MULTIVITAMIN WITH MINERALS) TABS tablet, Take 1 tablet by mouth every evening. , Disp: , Rfl:  .  mycophenolate (CELLCEPT) 250 MG capsule, Take 250 mg by mouth 2 (two) times daily. Take with Cellcept 500 mg to equal 750 mg, Disp: , Rfl:  .  mycophenolate (CELLCEPT) 500 MG tablet, Take 500 mg by mouth 2 (two) times daily. Take with Cellcept 250 mg to equal 750 mg, Disp: , Rfl:  .  omeprazole (PRILOSEC) 20 MG capsule, Take 20 mg by mouth every morning. , Disp: , Rfl:  .  ondansetron (ZOFRAN) 8 MG tablet, Take 1 tablet (8 mg total) by mouth every 8 (eight) hours as needed for nausea or vomiting., Disp: 20 tablet, Rfl: 0 .  oxyCODONE (ROXICODONE) 5 MG immediate release tablet, Take 1 tablet (5 mg total) by mouth every 6 (six) hours as needed for moderate pain or severe pain., Disp: 15 tablet, Rfl: 0 .  Polyethylene Glycol 3350 (MIRALAX PO), Take by mouth daily as needed., Disp: , Rfl:  .  potassium chloride SA (K-DUR,KLOR-CON) 20 MEQ tablet, Take 20 mEq by mouth 2 (two) times daily., Disp: , Rfl:  .  promethazine (PHENERGAN) 25 MG tablet, Take 1 tablet (25 mg total) by mouth every 6 (six)  hours as needed for nausea or vomiting., Disp: 12 tablet, Rfl: 1 .  spironolactone (ALDACTONE) 25 MG tablet, Take 12.5 mg by mouth every morning. , Disp: , Rfl:  .  traMADol (ULTRAM) 50 MG tablet, Take by mouth every 6 (six) hours as needed., Disp: , Rfl:  .  traZODone (DESYREL) 100 MG tablet, Take 1 tablet (100 mg total) by mouth at bedtime as needed for sleep., Disp: 90 tablet, Rfl: 1 .  Turmeric 500 MG CAPS, Take 500 mg by mouth daily., Disp: , Rfl:    Review of Systems:   Review of Systems  Constitutional: Positive for malaise/fatigue. Negative for diaphoresis and fever.  Respiratory: Positive for shortness of breath.   Cardiovascular: Positive for palpitations. Negative for chest pain.  Gastrointestinal: Positive for nausea. Negative for vomiting.  Genitourinary: Positive for flank pain. Negative for dysuria.  Neurological: Negative for dizziness and numbness.  Psychiatric/Behavioral: The patient is not nervous/anxious.     Vitals:   Vitals:   10/20/18 0822  BP: 110/80  Pulse: 62  Temp: 98 F (36.7 C)  TempSrc: Oral  SpO2: 97%  Weight: 197 lb 4 oz (89.5 kg)  Height: 5\' 10"  (1.778 m)     Body mass index is 28.3 kg/m.  Physical Exam:   Physical Exam Vitals signs and nursing note reviewed.  Constitutional:      General: She is not in acute distress.    Appearance: She is well-developed. She is not ill-appearing or toxic-appearing.  Cardiovascular:     Rate and Rhythm: Normal rate and regular rhythm.     Pulses: Normal pulses.     Heart sounds: Normal heart sounds, S1 normal and S2 normal.     Comments: No LE edema Pulmonary:     Effort: Pulmonary effort is normal.     Breath sounds: Normal breath sounds.  Musculoskeletal:     Comments: Negative homan's sign bilaterally No calf tenderness with palpation  Skin:    General: Skin is warm and dry.  Neurological:  Mental Status: She is alert.     GCS: GCS eye subscore is 4. GCS verbal subscore is 5. GCS motor  subscore is 6.  Psychiatric:        Speech: Speech normal.        Behavior: Behavior normal. Behavior is cooperative.     EKG tracing is personally reviewed.  EKG notes NSR.  No acute changes.   Results for orders placed or performed in visit on 10/20/18  POCT urinalysis dipstick  Result Value Ref Range   Color, UA Yellow    Clarity, UA Clear    Glucose, UA Negative Negative   Bilirubin, UA Negative    Ketones, UA Negative    Spec Grav, UA 1.015 1.010 - 1.025   Blood, UA Negative    pH, UA 7.0 5.0 - 8.0   Protein, UA Negative Negative   Urobilinogen, UA 0.2 0.2 or 1.0 E.U./dL   Nitrite, UA Negative    Leukocytes, UA Negative Negative   Appearance     Odor       Assessment and Plan:   Morrison Oldisha was seen today for palpitations and left flank pain.  Diagnoses and all orders for this visit:  Palpitations EKG tracing is personally reviewed.  EKG notes NSR.  No acute changes. Chronic history of this. She would like to ask around for recommendations for a cardiologist before we put in a consult, she declined consult today. She was advised as follows "If you develop any concerning symptoms such as heavy chest pain or severe shortness of breath, please go to the ER." Will check labs.  -     EKG 12-Lead -     Basic metabolic panel -     CBC with Differential/Platelet -     TSH  Left flank pain; Low back pain, unspecified back pain laterality, unspecified chronicity, unspecified whether sciatica present Urine without significant abnormalities. I offered flomax but she declined and stated that she should have some at home. I recommended that if flank pain persists, to follow up with urologist for further work-up. Will check CBC and BMP. -     POCT urinalysis dipstick  . Reviewed expectations re: course of current medical issues. . Discussed self-management of symptoms. . Outlined signs and symptoms indicating need for more acute intervention. . Patient verbalized understanding and all  questions were answered. . See orders for this visit as documented in the electronic medical record. . Patient received an After-Visit Summary.  CMA or LPN served as scribe during this visit. History, Physical, and Plan performed by medical provider. The above documentation has been reviewed and is accurate and complete.  Jarold MottoSamantha Ronold Hardgrove, PA-C

## 2018-10-20 NOTE — Patient Instructions (Signed)
We will be in touch with your lab results as soon as they return.  Please message [place] if/when you decide on a cardiologist you would like to see.  If you develop any concerning symptoms such as heavy chest pain or severe shortness of breath, please go to the ER.

## 2018-10-20 NOTE — Telephone Encounter (Signed)
Patient came to office wanting clarification on message. Patient states that she wanted to know why she was not given results of EKG. Reviewed all documentation in office note about EKG with patient as well as the results on EKG. Offered to verify  with Lelon Mast that their was no other results that needed to be readdressed with her. She declined. I asked if there were any other questions that I could help her with. She declined would just like to talk to Dr. Earlene Plater about it. She states she has follow up with her soon. offered to move appointment up and she declined. She will call if any further actions are needed from our office.

## 2018-10-20 NOTE — Telephone Encounter (Signed)
Patient came into the office and wanted to speak with the clinical team. I asked her to have a seat and checked with the clinical team.

## 2018-10-26 ENCOUNTER — Encounter: Payer: Self-pay | Admitting: Family Medicine

## 2018-10-26 DIAGNOSIS — R252 Cramp and spasm: Secondary | ICD-10-CM

## 2018-10-29 NOTE — Progress Notes (Signed)
[name] is a 43 y.o. female is here for follow up.  History of Present Illness:   I, Waymon Amato, acting as a Neurosurgeon for [employer], DO.,have documented all relevant documentation on the behalf of Helane Rima, DO,as directed by  Helane Rima, DO while in the presence of Helane Rima, DO.  Leg Pain   The incident occurred 5 to 7 days ago. There was no injury mechanism. The pain is present in the left leg and right leg. The quality of the pain is described as cramping ("crawling feeling"). The pain is at a severity of 3/10. The pain is mild. The pain has been intermittent since onset. Associated symptoms include an inability to bear weight and a loss of motion. She reports no foreign bodies present. The symptoms are aggravated by movement and weight bearing. She has tried elevation (compression socks) for the symptoms. The treatment provided no relief.   There are no preventive care reminders to display for this patient.   Depression screen PHQ 2/9 07/12/2018  Decreased Interest 0  Down, Depressed, Hopeless 0  PHQ - 2 Score 0   PMHx, SurgHx, SocialHx, FamHx, Medications, and Allergies were reviewed in the Visit Navigator and updated as appropriate.   Patient Active Problem List   Diagnosis Date Noted  . Renal calculus 07/17/2018  . Seasonal allergies 07/12/2018  . Vitamin D deficiency 07/12/2018  . Primary insomnia 07/12/2018  . GAD (generalized anxiety disorder) 07/12/2018  . Constipation 07/08/2018  . Sjogren's syndrome (HCC) 07/08/2018  . Chronic hypokalemia 07/08/2018  . Orthostatic hypotension 07/08/2018  . Gestational diabetes 07/08/2018  . GERD (gastroesophageal reflux disease)   . Ureteral stone with hydronephrosis 03/03/2018  . AKI (acute kidney injury) (HCC) 03/03/2018   Social History   Tobacco Use  . Smoking status: Former Smoker    Years: 10.00    Types: Cigarettes    Last attempt to quit: 03/17/2007    Years since quitting: 11.6  . Smokeless  tobacco: Never Used  Substance Use Topics  . Alcohol use: Not Currently    Frequency: Never  . Drug use: Never   Current Medications and Allergies:   .  B Complex-Biotin-FA (B COMPLETE PO), Take by mouth., Disp: , Rfl:  .  Cholecalciferol (VITAMIN D3) 5000 units CAPS, Take 5,000 Units by mouth daily., Disp: , Rfl:  .  diphenhydrAMINE (BENADRYL) 50 MG capsule, Take 50 mg by mouth at bedtime as needed for sleep., Disp: , Rfl:  .  gabapentin (NEURONTIN) 300 MG capsule, Take 300 mg by mouth 2 (two) times daily. , Disp: , Rfl:  .  Glucosamine HCl 1500 MG TABS, Take 1,500 mg by mouth daily., Disp: , Rfl:  .  loratadine (CLARITIN) 10 MG tablet, Take 10 mg by mouth every evening. , Disp: , Rfl:  .  LORazepam (ATIVAN) 0.5 MG tablet, Ativan 0.5 mg tablet  Take 2 tablets 3 times a day by oral route., Disp: , Rfl:  .  metoprolol succinate (TOPROL-XL) 25 MG 24 hr tablet, Take 25 mg by mouth every morning. , Disp: , Rfl:  .  Multiple Vitamin (MULTIVITAMIN WITH MINERALS) TABS tablet, Take 1 tablet by mouth every evening. , Disp: , Rfl:  .  mycophenolate (CELLCEPT) 250 MG capsule, Take 250 mg by mouth 2 (two) times daily. Take with Cellcept 500 mg to equal 750 mg, Disp: , Rfl:  .  mycophenolate (CELLCEPT) 500 MG tablet, Take 500 mg by mouth 2 (two) times daily. Take with Cellcept  250 mg to equal 750 mg, Disp: , Rfl:  .  omeprazole (PRILOSEC) 20 MG capsule, Take 20 mg by mouth every morning. , Disp: , Rfl:  .  ondansetron (ZOFRAN) 8 MG tablet, Take 1 tablet (8 mg total) by mouth every 8 (eight) hours as needed for nausea or vomiting., Disp: 20 tablet, Rfl: 0 .  oxyCODONE (ROXICODONE) 5 MG immediate release tablet, Take 1 tablet (5 mg total) by mouth every 6 (six) hours as needed for moderate pain or severe pain., Disp: 15 tablet, Rfl: 0 .  Polyethylene Glycol 3350 (MIRALAX PO), Take by mouth daily as needed., Disp: , Rfl:  .  potassium chloride SA (K-DUR,KLOR-CON) 20 MEQ tablet, Take 20 mEq by mouth 2 (two)  times daily., Disp: , Rfl:  .  promethazine (PHENERGAN) 25 MG tablet, Take 1 tablet (25 mg total) by mouth every 6 (six) hours as needed for nausea or vomiting., Disp: 12 tablet, Rfl: 1 .  spironolactone (ALDACTONE) 25 MG tablet, Take 12.5 mg by mouth every morning. , Disp: , Rfl:  .  traMADol (ULTRAM) 50 MG tablet, Take by mouth every 6 (six) hours as needed., Disp: , Rfl:  .  traZODone (DESYREL) 100 MG tablet, Take 1 tablet (100 mg total) by mouth at bedtime as needed for sleep., Disp: 90 tablet, Rfl: 1 .  Turmeric 500 MG CAPS, Take 500 mg by mouth daily., Disp: , Rfl:    Allergies  Allergen Reactions  . Cardizem [Diltiazem Hcl] Shortness Of Breath    And chest pain  . Citalopram Other (See Comments)    Has reaction to ALL SSRI's per patient cause suicidal ideation  . Hydromorphone Hcl Shortness Of Breath  . Lexapro [Escitalopram Oxalate] Other (See Comments)    Has reaction to ALL SSRI's per patient cause suicidal ideation  . Paxil [Paroxetine Hcl] Other (See Comments)    Has reaction to ALL SSRI's per patient cause suicidal ideation  . Prozac [Fluoxetine] Other (See Comments)    Has reaction to ALL SSRI's per patient cause suicidal ideation  . Zoloft [Sertraline] Other (See Comments)    Has reaction to ALL SSRI's per patient cause suicidal ideation  . Tape Rash   Review of Systems   Pertinent items are noted in the HPI. Otherwise, a complete ROS is negative.  Vitals:   Vitals:   10/30/18 1358  BP: 110/70  Pulse: 69  Temp: 98.2 F (36.8 C)  TempSrc: Oral  SpO2: 100%  Weight: 194 lb 12.8 oz (88.4 kg)  Height: 5\' 10"  (1.778 m)     Body mass index is 27.95 kg/m.  Physical Exam:   Physical Exam Vitals signs and nursing note reviewed.  Constitutional:      General: She is not in acute distress.    Appearance: She is well-developed.  HENT:     Head: Normocephalic and atraumatic.     Right Ear: External ear normal.     Left Ear: External ear normal.     Nose: Nose  normal.  Eyes:     Conjunctiva/sclera: Conjunctivae normal.     Pupils: Pupils are equal, round, and reactive to light.  Neck:     Musculoskeletal: Normal range of motion and neck supple.     Thyroid: No thyromegaly.  Cardiovascular:     Rate and Rhythm: Normal rate and regular rhythm.     Heart sounds: Normal heart sounds.  Pulmonary:     Effort: Pulmonary effort is normal.     Breath sounds:  Normal breath sounds.  Abdominal:     General: Bowel sounds are normal.     Palpations: Abdomen is soft.  Musculoskeletal: Normal range of motion.  Lymphadenopathy:     Cervical: No cervical adenopathy.  Skin:    General: Skin is warm and dry.     Capillary Refill: Capillary refill takes less than 2 seconds.  Neurological:     Mental Status: She is alert and oriented to person, place, and time.  Psychiatric:        Behavior: Behavior normal.     Results for orders placed or performed in visit on 10/30/18  Basic metabolic panel  Result Value Ref Range   Glucose, Bld 111 (H) 65 - 99 mg/dL   BUN 16 7 - 25 mg/dL   Creat 9.601.09 4.540.50 - 0.981.10 mg/dL   BUN/Creatinine Ratio NOT APPLICABLE 6 - 22 (calc)   Sodium 135 135 - 146 mmol/L   Potassium 3.8 3.5 - 5.3 mmol/L   Chloride 106 98 - 110 mmol/L   CO2 23 20 - 32 mmol/L   Calcium 9.3 8.6 - 10.2 mg/dL  C-reactive protein  Result Value Ref Range   CRP 2.5 <8.0 mg/L  Magnesium  Result Value Ref Range   Magnesium 2.0 1.5 - 2.5 mg/dL  Sedimentation rate  Result Value Ref Range   Sed Rate 17 0 - 20 mm/h    Assessment and Plan:   Morrison Oldisha was seen today for leg pain.  Diagnoses and all orders for this visit:  Leg cramps -     Basic metabolic panel -     C-reactive protein -     Magnesium -     Sedimentation rate  Patient states that the cramps felt more like a charlie-horse. Intermittent. 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 She wears very tight compression hose. Hx of hypokalemia and has already started potassium supplements.  No trauma. No LBP, but she has chronic lumbar DJD. No LE weakness. No claudication symptoms. Normal DP. Symptoms symmetric. No new medications, supplements, exposures. Labs reassuring. See lab result note.  . Orders and follow up as documented in EpicCare, reviewed diet, exercise and weight control, cardiovascular risk and specific lipid/LDL goals reviewed, reviewed medications and side effects in detail.  . Reviewed expectations re: course of current medical issues. . Outlined signs and symptoms indicating need for more acute intervention. . Patient verbalized understanding and all questions were answered. . Patient received an After Visit Summary.  CMA served as Neurosurgeonscribe during this visit. History, Physical, and Plan performed by medical provider. The above documentation has been reviewed and is accurate and complete. Helane RimaErica Estrellita Lasky, D.O.  Helane RimaErica Coretta Leisey, DO [place] [place] [facility] 10/31/2018

## 2018-10-29 NOTE — Telephone Encounter (Signed)
Patient scheduled for 10/30/2018 for leg cramps.

## 2018-10-30 ENCOUNTER — Ambulatory Visit: Payer: Commercial Managed Care - PPO | Admitting: Family Medicine

## 2018-10-30 ENCOUNTER — Encounter: Payer: Self-pay | Admitting: Family Medicine

## 2018-10-30 VITALS — BP 110/70 | HR 69 | Temp 98.2°F | Ht 70.0 in | Wt 194.8 lb

## 2018-10-30 DIAGNOSIS — R252 Cramp and spasm: Secondary | ICD-10-CM | POA: Diagnosis not present

## 2018-10-30 DIAGNOSIS — E559 Vitamin D deficiency, unspecified: Secondary | ICD-10-CM

## 2018-10-31 ENCOUNTER — Encounter: Payer: Self-pay | Admitting: Family Medicine

## 2018-10-31 LAB — C-REACTIVE PROTEIN: CRP: 2.5 mg/L (ref <8.0)

## 2018-10-31 LAB — BASIC METABOLIC PANEL
BUN: 16 mg/dL (ref 7–25)
CO2: 23 mmol/L (ref 20–32)
Calcium: 9.3 mg/dL (ref 8.6–10.2)
Chloride: 106 mmol/L (ref 98–110)
Creat: 1.09 mg/dL (ref 0.50–1.10)
Glucose, Bld: 111 mg/dL — ABNORMAL HIGH (ref 65–99)
Potassium: 3.8 mmol/L (ref 3.5–5.3)
Sodium: 135 mmol/L (ref 135–146)

## 2018-10-31 LAB — SEDIMENTATION RATE: Sed Rate: 17 mm/h (ref 0–20)

## 2018-10-31 LAB — MAGNESIUM: Magnesium: 2 mg/dL (ref 1.5–2.5)

## 2018-11-04 ENCOUNTER — Ambulatory Visit: Payer: Commercial Managed Care - PPO | Admitting: Family Medicine

## 2018-11-09 ENCOUNTER — Ambulatory Visit: Payer: Commercial Managed Care - PPO | Admitting: Family Medicine

## 2018-11-13 NOTE — Telephone Encounter (Signed)
Pt came in office in reference to last visit. Cramps are not better even with compression hose and magnesium and meds. Pt stated [name] had mentioned doing an xray of back and dif meds if this was not better. Pt also mentioned referral to sports med. Pt stated she would really like to know what to do before the weekend. Please advise.

## 2018-11-14 ENCOUNTER — Other Ambulatory Visit: Payer: Self-pay | Admitting: Family Medicine

## 2018-11-14 DIAGNOSIS — R252 Cramp and spasm: Secondary | ICD-10-CM

## 2018-11-16 ENCOUNTER — Telehealth: Payer: Self-pay | Admitting: Family Medicine

## 2018-11-16 NOTE — Telephone Encounter (Signed)
Copied from CRM [id number]. Topic: Quick Communication - See Telephone Encounter >> Nov 16, 2018 12:50 PM Angela Nevin wrote: CRM for notification. See Telephone encounter for: 11/16/18.  Patient states she walked into office on 1/31 to leave a message for PCP about worsening leg cramps and question about possible PT as patient states she discussed at OV on 1/17. Patient states she has not heard anything back regarding message left, and inquired if she needed to make an appointment to discuss further. Please advise.

## 2018-11-16 NOTE — Telephone Encounter (Signed)
See note

## 2018-11-16 NOTE — Telephone Encounter (Signed)
Please advise 

## 2018-11-16 NOTE — Telephone Encounter (Signed)
I don't see the mychart email that I sent to her. I okayed the labs and put in future orders on 11/14/18 so that she could come in for labs. Okay PT.

## 2018-11-17 ENCOUNTER — Ambulatory Visit (INDEPENDENT_AMBULATORY_CARE_PROVIDER_SITE_OTHER): Payer: Commercial Managed Care - PPO

## 2018-11-17 ENCOUNTER — Other Ambulatory Visit: Payer: Self-pay

## 2018-11-17 ENCOUNTER — Ambulatory Visit: Payer: Commercial Managed Care - PPO | Admitting: Family Medicine

## 2018-11-17 VITALS — BP 122/68 | HR 62 | Temp 97.6°F | Ht 69.0 in | Wt 204.6 lb

## 2018-11-17 DIAGNOSIS — M5136 Other intervertebral disc degeneration, lumbar region: Secondary | ICD-10-CM | POA: Diagnosis not present

## 2018-11-17 DIAGNOSIS — R252 Cramp and spasm: Secondary | ICD-10-CM | POA: Diagnosis not present

## 2018-11-17 DIAGNOSIS — M5441 Lumbago with sciatica, right side: Secondary | ICD-10-CM

## 2018-11-17 DIAGNOSIS — G8929 Other chronic pain: Secondary | ICD-10-CM

## 2018-11-17 DIAGNOSIS — M5442 Lumbago with sciatica, left side: Secondary | ICD-10-CM

## 2018-11-17 LAB — CBC WITH DIFFERENTIAL/PLATELET
Basophils Absolute: 0 10*3/uL (ref 0.0–0.1)
Basophils Relative: 0.5 % (ref 0.0–3.0)
Eosinophils Absolute: 0 10*3/uL (ref 0.0–0.7)
Eosinophils Relative: 0.7 % (ref 0.0–5.0)
HCT: 38 % (ref 36.0–46.0)
Hemoglobin: 12.7 g/dL (ref 12.0–15.0)
Lymphocytes Relative: 16.4 % (ref 12.0–46.0)
Lymphs Abs: 0.6 10*3/uL — ABNORMAL LOW (ref 0.7–4.0)
MCHC: 33.4 g/dL (ref 30.0–36.0)
MCV: 92.8 fl (ref 78.0–100.0)
Monocytes Absolute: 0.4 10*3/uL (ref 0.1–1.0)
Monocytes Relative: 9.7 % (ref 3.0–12.0)
Neutro Abs: 2.6 10*3/uL (ref 1.4–7.7)
Neutrophils Relative %: 72.7 % (ref 43.0–77.0)
Platelets: 136 10*3/uL — ABNORMAL LOW (ref 150.0–400.0)
RBC: 4.09 Mil/uL (ref 3.87–5.11)
RDW: 13.2 % (ref 11.5–15.5)
WBC: 3.6 10*3/uL — ABNORMAL LOW (ref 4.0–10.5)

## 2018-11-17 LAB — CK: Total CK: 30 U/L (ref 7–177)

## 2018-11-17 LAB — COMPREHENSIVE METABOLIC PANEL
ALT: 10 U/L (ref 0–35)
AST: 12 U/L (ref 0–37)
Albumin: 3.8 g/dL (ref 3.5–5.2)
Alkaline Phosphatase: 53 U/L (ref 39–117)
BUN: 18 mg/dL (ref 6–23)
CO2: 25 mEq/L (ref 19–32)
Calcium: 8.8 mg/dL (ref 8.4–10.5)
Chloride: 108 mEq/L (ref 96–112)
Creatinine, Ser: 1.02 mg/dL (ref 0.40–1.20)
GFR: 59.55 mL/min — ABNORMAL LOW (ref >60.00)
Glucose, Bld: 97 mg/dL (ref 70–99)
Potassium: 4.3 mEq/L (ref 3.5–5.1)
Sodium: 139 mEq/L (ref 135–145)
Total Bilirubin: 0.2 mg/dL (ref 0.2–1.2)
Total Protein: 6.5 g/dL (ref 6.0–8.3)

## 2018-11-17 LAB — IBC + FERRITIN
Ferritin: 105.2 ng/mL (ref 10.0–291.0)
Iron: 64 ug/dL (ref 42–145)
Saturation Ratios: 20.6 % (ref 20.0–50.0)
Transferrin: 222 mg/dL (ref 212.0–360.0)

## 2018-11-17 LAB — MAGNESIUM: Magnesium: 2 mg/dL (ref 1.5–2.5)

## 2018-11-17 LAB — VITAMIN B12: Vitamin B-12: 355 pg/mL (ref 211–911)

## 2018-11-17 LAB — TSH: TSH: 1.9 u[IU]/mL (ref 0.35–4.50)

## 2018-11-17 MED ORDER — OXYCODONE-ACETAMINOPHEN 5-325 MG PO TABS: 1.0000 | ORAL_TABLET | Freq: Three times a day (TID) | ORAL | 0 refills | Status: DC | PRN

## 2018-11-17 MED ORDER — GABAPENTIN 300 MG PO CAPS: 300.0000 mg | ORAL_CAPSULE | Freq: Three times a day (TID) | ORAL | 3 refills | Status: DC

## 2018-11-17 MED ORDER — METHOCARBAMOL 500 MG PO TABS: 500.0000 mg | ORAL_TABLET | Freq: Four times a day (QID) | ORAL | 0 refills | Status: DC

## 2018-11-17 NOTE — Telephone Encounter (Signed)
Addressed at appt 11/17/18

## 2018-11-17 NOTE — Progress Notes (Signed)
[name] is a 43 y.o. female here for an acute visit.  History of Present Illness:   Barnie Mort, CMA acting as scribe for Dr. Helane Rima.   HPI: Patient in office for continued leg pain. She was seen in the office but put off test that were suggested due to work would like to have them done now. She is having pain and cramping in both legs. Symptoms have gotten worse over time. This has been going on for over a month.   Pain became more severe with working, [employer]. Stopped working and down "to baseline constant pain of 4/10" after 2 days. Noticing more spasms, more back pain. Previous labs reassuring. We discussed XRAY, muscle relaxer.   PMHx, SurgHx, SocialHx, Medications, and Allergies were reviewed in the Visit Navigator and updated as appropriate.  Current Medications   .  B Complex-Biotin-FA (B COMPLETE PO), Take by mouth., Disp: , Rfl:  .  Cholecalciferol (VITAMIN D3) 5000 units CAPS, Take 5,000 Units by mouth daily., Disp: , Rfl:  .  diphenhydrAMINE (BENADRYL) 50 MG capsule, Take 50 mg by mouth at bedtime as needed for sleep., Disp: , Rfl:  .  gabapentin (NEURONTIN) 300 MG capsule, Take 300 mg by mouth 2 (two) times daily. , Disp: , Rfl:  .  Glucosamine HCl 1500 MG TABS, Take 1,500 mg by mouth daily., Disp: , Rfl:  .  loratadine (CLARITIN) 10 MG tablet, Take 10 mg by mouth every evening. , Disp: , Rfl:  .  LORazepam (ATIVAN) 0.5 MG tablet, Ativan 0.5 mg tablet  Take 2 tablets 3 times a day by oral route., Disp: , Rfl:  .  metoprolol succinate (TOPROL-XL) 25 MG 24 hr tablet, Take 25 mg by mouth every morning. , Disp: , Rfl:  .  Multiple Vitamin (MULTIVITAMIN WITH MINERALS) TABS tablet, Take 1 tablet by mouth every evening. , Disp: , Rfl:  .  mycophenolate (CELLCEPT) 250 MG capsule, Take 250 mg by mouth 2 (two) times daily. Take with Cellcept 500 mg to equal 750 mg, Disp: , Rfl:  .  mycophenolate (CELLCEPT) 500 MG tablet, Take 500 mg by mouth 2 (two) times daily. Take with  Cellcept 250 mg to equal 750 mg, Disp: , Rfl:  .  omeprazole (PRILOSEC) 20 MG capsule, Take 20 mg by mouth every morning. , Disp: , Rfl:  .  ondansetron (ZOFRAN) 8 MG tablet, Take 1 tablet (8 mg total) by mouth every 8 (eight) hours as needed for nausea or vomiting., Disp: 20 tablet, Rfl: 0 .  oxyCODONE (ROXICODONE) 5 MG immediate release tablet, Take 1 tablet (5 mg total) by mouth every 6 (six) hours as needed for moderate pain or severe pain., Disp: 15 tablet, Rfl: 0 .  Polyethylene Glycol 3350 (MIRALAX PO), Take by mouth daily as needed., Disp: , Rfl:  .  potassium chloride SA (K-DUR,KLOR-CON) 20 MEQ tablet, Take 20 mEq by mouth 2 (two) times daily., Disp: , Rfl:  .  promethazine (PHENERGAN) 25 MG tablet, Take 1 tablet (25 mg total) by mouth every 6 (six) hours as needed for nausea or vomiting., Disp: 12 tablet, Rfl: 1 .  spironolactone (ALDACTONE) 25 MG tablet, Take 12.5 mg by mouth every morning. , Disp: , Rfl:  .  traMADol (ULTRAM) 50 MG tablet, Take by mouth every 6 (six) hours as needed., Disp: , Rfl:  .  traZODone (DESYREL) 100 MG tablet, Take 1 tablet (100 mg total) by mouth at bedtime as needed for sleep., Disp: 90  tablet, Rfl: 1 .  Turmeric 500 MG CAPS, Take 500 mg by mouth daily., Disp: , Rfl:    Allergies  Allergen Reactions  . Cardizem [Diltiazem Hcl] Shortness Of Breath    And chest pain  . Citalopram Other (See Comments)    Has reaction to ALL SSRI's per patient cause suicidal ideation  . Hydromorphone Hcl Shortness Of Breath  . Lexapro [Escitalopram Oxalate] Other (See Comments)    Has reaction to ALL SSRI's per patient cause suicidal ideation  . Paxil [Paroxetine Hcl] Other (See Comments)    Has reaction to ALL SSRI's per patient cause suicidal ideation  . Prozac [Fluoxetine] Other (See Comments)    Has reaction to ALL SSRI's per patient cause suicidal ideation  . Zoloft [Sertraline] Other (See Comments)    Has reaction to ALL SSRI's per patient cause suicidal ideation    . Tape Rash   Review of Systems   Pertinent items are noted in the HPI. Otherwise, ROS is negative.  Vitals   Vitals:   11/17/18 0837  BP: 122/68  Pulse: 62  Temp: 97.6 F (36.4 C)  TempSrc: Oral  SpO2: 96%  Weight: 204 lb 9.6 oz (92.8 kg)  Height: 5\' 9"  (1.753 m)     Body mass index is 30.21 kg/m.  Physical Exam   Physical Exam Vitals signs and nursing note reviewed.  HENT:     Head: Normocephalic and atraumatic.  Eyes:     Pupils: Pupils are equal, round, and reactive to light.  Neck:     Musculoskeletal: Normal range of motion and neck supple.  Cardiovascular:     Rate and Rhythm: Normal rate and regular rhythm.     Heart sounds: Normal heart sounds.  Pulmonary:     Effort: Pulmonary effort is normal.  Abdominal:     Palpations: Abdomen is soft.  Musculoskeletal:     Lumbar back: She exhibits tenderness.     Right lower leg: Normal. No edema.     Left lower leg: Normal. No edema.  Skin:    General: Skin is warm.     Capillary Refill: Capillary refill takes less than 2 seconds.  Neurological:     General: No focal deficit present.  Psychiatric:        Behavior: Behavior normal.      Results for orders placed or performed in visit on 11/17/18  CBC with Differential/Platelet  Result Value Ref Range   WBC 3.6 (L) 4.0 - 10.5 K/uL   RBC 4.09 3.87 - 5.11 Mil/uL   Hemoglobin 12.7 12.0 - 15.0 g/dL   HCT 16.138.0 09.636.0 - 04.546.0 %   MCV 92.8 78.0 - 100.0 fl   MCHC 33.4 30.0 - 36.0 g/dL   RDW 40.913.2 81.111.5 - 91.415.5 %   Platelets 136.0 (L) 150.0 - 400.0 K/uL   Neutrophils Relative % 72.7 43.0 - 77.0 %   Lymphocytes Relative 16.4 12.0 - 46.0 %   Monocytes Relative 9.7 3.0 - 12.0 %   Eosinophils Relative 0.7 0.0 - 5.0 %   Basophils Relative 0.5 0.0 - 3.0 %   Neutro Abs 2.6 1.4 - 7.7 K/uL   Lymphs Abs 0.6 (L) 0.7 - 4.0 K/uL   Monocytes Absolute 0.4 0.1 - 1.0 K/uL   Eosinophils Absolute 0.0 0.0 - 0.7 K/uL   Basophils Absolute 0.0 0.0 - 0.1 K/uL  Comprehensive  metabolic panel  Result Value Ref Range   Sodium 139 135 - 145 mEq/L   Potassium 4.3 3.5 -  5.1 mEq/L   Chloride 108 96 - 112 mEq/L   CO2 25 19 - 32 mEq/L   Glucose, Bld 97 70 - 99 mg/dL   BUN 18 6 - 23 mg/dL   Creatinine, Ser 6.57 0.40 - 1.20 mg/dL   Total Bilirubin 0.2 0.2 - 1.2 mg/dL   Alkaline Phosphatase 53 39 - 117 U/L   AST 12 0 - 37 U/L   ALT 10 0 - 35 U/L   Total Protein 6.5 6.0 - 8.3 g/dL   Albumin 3.8 3.5 - 5.2 g/dL   Calcium 8.8 8.4 - 84.6 mg/dL   GFR 96.29 (L) >52.84 mL/min  Vitamin B12  Result Value Ref Range   Vitamin B-12 355 211 - 911 pg/mL  TSH  Result Value Ref Range   TSH 1.90 0.35 - 4.50 uIU/mL  CK  Result Value Ref Range   Total CK 30 7 - 177 U/L  Magnesium  Result Value Ref Range   Magnesium 2.0 1.5 - 2.5 mg/dL  IBC + Ferritin  Result Value Ref Range   Iron 64 42 - 145 ug/dL   Transferrin 132.4 401.0 - 360.0 mg/dL   Saturation Ratios 27.2 20.0 - 50.0 %   Ferritin 105.2 10.0 - 291.0 ng/mL   Dg Lumbar Spine Complete  Result Date: 11/17/2018 CLINICAL DATA:  Chronic low back pain with numbness radiating into both LOWER legs. EXAM: LUMBAR SPINE - COMPLETE 4+ VIEW COMPARISON:  None. FINDINGS: Five non rib-bearing lumbar type vertebra are identified in normal alignment. No fracture or subluxation. Moderate degenerative disc disease at L5-S1 noted. No focal bony lesions or spondylolysis identified. Multiple bilateral renal calculi noted. Surgical clips/devices within the RIGHT pelvis noted. IMPRESSION: 1. No evidence of acute lumbar spine abnormality. 2. Moderate degenerative disc disease/spondylosis at L5-S1 3. Nephrolithiasis Electronically Signed   By: Harmon Pier M.D.   On: 11/17/2018 12:59   Assessment and Plan   Ayan was seen today for leg pain.  Diagnoses and all orders for this visit:  Leg cramps Comments: DDx: electrolyte/mineral/vitamin deficiency, rhabdo, claudication (neurogenic). Labs and imaging today.  Orders: -     methocarbamol  (ROBAXIN) 500 MG tablet; Take 1 tablet (500 mg total) by mouth 4 (four) times daily. -     oxyCODONE-acetaminophen (PERCOCET/ROXICET) 5-325 MG tablet; Take 1-2 tablets by mouth every 8 (eight) hours as needed for severe pain. -     gabapentin (NEURONTIN) 300 MG capsule; Take 1 capsule (300 mg total) by mouth 3 (three) times daily. -     CBC with Differential/Platelet -     Comprehensive metabolic panel -     Cancel: Iron, TIBC and Ferritin Panel -     Vitamin B12 -     TSH -     CK -     Magnesium -     DG Lumbar Spine Complete -     IBC + Ferritin -     MR Lumbar Spine Wo Contrast; Future  Chronic bilateral low back pain with bilateral sciatica -     methocarbamol (ROBAXIN) 500 MG tablet; Take 1 tablet (500 mg total) by mouth 4 (four) times daily. -     oxyCODONE-acetaminophen (PERCOCET/ROXICET) 5-325 MG tablet; Take 1-2 tablets by mouth every 8 (eight) hours as needed for severe pain. -     gabapentin (NEURONTIN) 300 MG capsule; Take 1 capsule (300 mg total) by mouth 3 (three) times daily. -     CBC with Differential/Platelet -  Comprehensive metabolic panel -     Cancel: Iron, TIBC and Ferritin Panel -     Vitamin B12 -     TSH -     CK -     Magnesium -     DG Lumbar Spine Complete -     IBC + Ferritin -     MR Lumbar Spine Wo Contrast; Future  Other intervertebral disc degeneration, lumbar region -     MR Lumbar Spine Wo Contrast; Future   . Reviewed expectations re: course of current medical issues. . Discussed self-management of symptoms. . Outlined signs and symptoms indicating need for more acute intervention. . Patient verbalized understanding and all questions were answered. Marland Kitchen Health Maintenance issues including appropriate healthy diet, exercise, and smoking avoidance were discussed with patient. . See orders for this visit as documented in the electronic medical record. . Patient received an After Visit Summary.  CMA served as Neurosurgeon during this visit.  History, Physical, and Plan performed by medical provider. The above documentation has been reviewed and is accurate and complete. Helane Rima, D.O.  Helane Rima, DO [place] 11/22/2018

## 2018-11-22 ENCOUNTER — Encounter: Payer: Self-pay | Admitting: Family Medicine

## 2018-12-02 DIAGNOSIS — Z6830 Body mass index (BMI) 30.0-30.9, adult: Secondary | ICD-10-CM | POA: Diagnosis not present

## 2018-12-02 DIAGNOSIS — E669 Obesity, unspecified: Secondary | ICD-10-CM | POA: Diagnosis not present

## 2018-12-02 DIAGNOSIS — M3500 Sicca syndrome, unspecified: Secondary | ICD-10-CM | POA: Diagnosis not present

## 2018-12-03 ENCOUNTER — Ambulatory Visit
Admission: RE | Admit: 2018-12-03 | Discharge: 2018-12-03 | Disposition: A | Discharge status: Home / Self Care | Payer: Commercial Managed Care - PPO | Source: Ambulatory Visit | Attending: Family Medicine | Admitting: Family Medicine

## 2018-12-03 DIAGNOSIS — R252 Cramp and spasm: Secondary | ICD-10-CM

## 2018-12-03 DIAGNOSIS — M48061 Spinal stenosis, lumbar region without neurogenic claudication: Secondary | ICD-10-CM | POA: Diagnosis not present

## 2018-12-03 DIAGNOSIS — M5441 Lumbago with sciatica, right side: Secondary | ICD-10-CM

## 2018-12-03 DIAGNOSIS — M5136 Other intervertebral disc degeneration, lumbar region: Secondary | ICD-10-CM

## 2018-12-03 DIAGNOSIS — M5442 Lumbago with sciatica, left side: Secondary | ICD-10-CM

## 2018-12-03 DIAGNOSIS — G8929 Other chronic pain: Secondary | ICD-10-CM

## 2018-12-05 ENCOUNTER — Other Ambulatory Visit: Payer: Self-pay | Admitting: Family Medicine

## 2018-12-05 DIAGNOSIS — M5442 Lumbago with sciatica, left side: Secondary | ICD-10-CM

## 2018-12-05 DIAGNOSIS — R252 Cramp and spasm: Secondary | ICD-10-CM

## 2018-12-05 DIAGNOSIS — G8929 Other chronic pain: Secondary | ICD-10-CM

## 2018-12-05 DIAGNOSIS — M5441 Lumbago with sciatica, right side: Secondary | ICD-10-CM

## 2018-12-07 MED ORDER — ONDANSETRON HCL 8 MG PO TABS: 8.0000 mg | ORAL_TABLET | Freq: Three times a day (TID) | ORAL | 0 refills | Status: DC | PRN

## 2018-12-07 MED ORDER — OXYCODONE-ACETAMINOPHEN 5-325 MG PO TABS: 1.0000 | ORAL_TABLET | Freq: Three times a day (TID) | ORAL | 0 refills | Status: DC | PRN

## 2018-12-07 NOTE — Telephone Encounter (Signed)
Dr Wallace pt °

## 2018-12-09 ENCOUNTER — Other Ambulatory Visit: Payer: Self-pay

## 2018-12-09 DIAGNOSIS — G8929 Other chronic pain: Secondary | ICD-10-CM

## 2018-12-09 DIAGNOSIS — M5442 Lumbago with sciatica, left side: Principal | ICD-10-CM

## 2018-12-09 DIAGNOSIS — M5441 Lumbago with sciatica, right side: Principal | ICD-10-CM

## 2018-12-23 ENCOUNTER — Telehealth (INDEPENDENT_AMBULATORY_CARE_PROVIDER_SITE_OTHER): Payer: Self-pay | Admitting: *Deleted

## 2018-12-23 ENCOUNTER — Ambulatory Visit (INDEPENDENT_AMBULATORY_CARE_PROVIDER_SITE_OTHER): Payer: Commercial Managed Care - PPO | Admitting: Physical Medicine and Rehabilitation

## 2018-12-23 ENCOUNTER — Encounter (INDEPENDENT_AMBULATORY_CARE_PROVIDER_SITE_OTHER): Payer: Self-pay | Admitting: Physical Medicine and Rehabilitation

## 2018-12-23 ENCOUNTER — Other Ambulatory Visit: Payer: Self-pay

## 2018-12-23 VITALS — BP 126/86 | HR 66 | Ht 69.0 in | Wt 200.0 lb

## 2018-12-23 DIAGNOSIS — M5116 Intervertebral disc disorders with radiculopathy, lumbar region: Secondary | ICD-10-CM | POA: Diagnosis not present

## 2018-12-23 DIAGNOSIS — M5441 Lumbago with sciatica, right side: Secondary | ICD-10-CM

## 2018-12-23 DIAGNOSIS — M47816 Spondylosis without myelopathy or radiculopathy, lumbar region: Secondary | ICD-10-CM

## 2018-12-23 DIAGNOSIS — M48061 Spinal stenosis, lumbar region without neurogenic claudication: Secondary | ICD-10-CM | POA: Diagnosis not present

## 2018-12-23 DIAGNOSIS — G8929 Other chronic pain: Secondary | ICD-10-CM | POA: Insufficient documentation

## 2018-12-23 DIAGNOSIS — M5442 Lumbago with sciatica, left side: Secondary | ICD-10-CM

## 2018-12-23 NOTE — Progress Notes (Signed)
.  Numeric Pain Rating Scale and Functional Assessment Average Pain 5 Pain Right Now 6 My pain is intermittent, sharp, stabbing and tingling Pain is worse with: bending and some activites Pain improves with: medication   In the last MONTH (on 0-10 scale) has pain interfered with the following?  1. General activity like being  able to carry out your everyday physical activities such as walking, climbing stairs, carrying groceries, or moving a chair?  Rating(5)  2. Relation with others like being able to carry out your usual social activities and roles such as  activities at home, at work and in your community. Rating(5)  3. Enjoyment of life such that you have  been bothered by emotional problems such as feeling anxious, depressed or irritable?  Rating(3)

## 2018-12-24 ENCOUNTER — Other Ambulatory Visit (INDEPENDENT_AMBULATORY_CARE_PROVIDER_SITE_OTHER): Payer: Self-pay | Admitting: Physical Medicine and Rehabilitation

## 2018-12-24 DIAGNOSIS — F411 Generalized anxiety disorder: Secondary | ICD-10-CM

## 2018-12-24 MED ORDER — DIAZEPAM 5 MG PO TABS: ORAL_TABLET | ORAL | 0 refills | Status: DC

## 2018-12-24 NOTE — Telephone Encounter (Signed)
Done today.

## 2018-12-24 NOTE — Progress Notes (Signed)
Pre-procedure diazepam ordered for pre-operative anxiety.  

## 2018-12-24 NOTE — Telephone Encounter (Signed)
Called pt and advised.  

## 2018-12-25 ENCOUNTER — Other Ambulatory Visit: Payer: Self-pay | Admitting: Family Medicine

## 2018-12-25 DIAGNOSIS — G8929 Other chronic pain: Secondary | ICD-10-CM

## 2018-12-25 DIAGNOSIS — M5442 Lumbago with sciatica, left side: Secondary | ICD-10-CM

## 2018-12-25 DIAGNOSIS — M5441 Lumbago with sciatica, right side: Secondary | ICD-10-CM

## 2018-12-25 DIAGNOSIS — R252 Cramp and spasm: Secondary | ICD-10-CM

## 2018-12-25 MED ORDER — OXYCODONE-ACETAMINOPHEN 5-325 MG PO TABS: 1.0000 | ORAL_TABLET | Freq: Three times a day (TID) | ORAL | 0 refills | Status: DC | PRN

## 2018-12-25 MED ORDER — METHOCARBAMOL 500 MG PO TABS: 500.0000 mg | ORAL_TABLET | Freq: Four times a day (QID) | ORAL | 0 refills | Status: DC

## 2018-12-25 NOTE — Telephone Encounter (Signed)
Ok to fill 

## 2018-12-31 ENCOUNTER — Encounter: Payer: Self-pay | Admitting: Family Medicine

## 2018-12-31 MED ORDER — POTASSIUM CHLORIDE CRYS ER 20 MEQ PO TBCR: 20.0000 meq | EXTENDED_RELEASE_TABLET | Freq: Two times a day (BID) | ORAL | 1 refills | Status: DC

## 2018-12-31 NOTE — Telephone Encounter (Signed)
Last OV 11/17/2018.   Forwarding to Dr. Earlene Plater as this medication has not been previously prescribed by her.

## 2019-01-04 ENCOUNTER — Encounter (INDEPENDENT_AMBULATORY_CARE_PROVIDER_SITE_OTHER): Payer: Self-pay | Admitting: Physical Medicine and Rehabilitation

## 2019-01-04 NOTE — Progress Notes (Signed)
[name] - 43 y.o. female MRN [id number]  Date of birth: Feb 18, 1976  Office Visit Note: Visit Date: 12/23/2018 PCP: Helane Rima, DO Referred by: Helane Rima, DO  Subjective: Chief Complaint  Patient presents with   Lower Back - Pain   Right Leg - Pain   Left Leg - Pain   HPI: [name] is a 43 y.o. female who comes in today At the request of Dr. Helane Rima for evaluation management of chronic low back pain and bilateral leg pain and leg cramping.  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  She reports onset of leg complaints in the beginning of January.  She denies any specific injury although she had a remote injury that her her knees and other orthopedic complaints from a horse injury.  She reports pain in the lower back that refers into both legs all the way down to the ankles and top of the feet.  She gets some symptoms of the bottom of the feet and side.  Somewhat globally symptoms.  She reports the left leg is worse than the right leg.  She reports all together that the back pain started many years ago but worsening at the beginning of January.  She reports worsening with moving and bending.  She reports her pain medication helps to some degree.  She has had tramadol and oxycodone as well as gabapentin.  She takes 600 mg of gabapentin twice a day.  Her symptoms are intermittent sharp stabbing and tingling.  Again bending is worse better with some medication.  She has not had specific physical therapy recently.  She does try to stay active.  She really has not been able to work as much as she would like to given the new symptoms.  She has not noted any focal weakness but is felt give way weakness.  She has not had any fevers chills or night sweats.  She has not had any unintended weight loss.  She suffers from Sjogren's syndrome and does have a history of chronic hypokalemia.  Dr. Earlene Plater is completed lab work and ultimately did get MRI of  the lumbar spine.  This was reviewed with the patient today using spine model and images.  She does have small central noncompressive protrusion at L4-5 and then the left subarticular protrusion at L5-S1 with more broad bulging and some mild arthritic changes.  She clearly could have left radicular complaints from that.  Review of Systems  Constitutional: Negative for chills, fever, malaise/fatigue and weight loss.  HENT: Negative for hearing loss and sinus pain.   Eyes: Negative for blurred vision, double vision and photophobia.  Respiratory: Negative for cough and shortness of breath.   Cardiovascular: Negative for chest pain, palpitations and leg swelling.  Gastrointestinal: Negative for abdominal pain, nausea and vomiting.  Genitourinary: Negative for flank pain.  Musculoskeletal: Positive for back pain and joint pain. Negative for myalgias.  Skin: Negative for itching and rash.  Neurological: Positive for tingling. Negative for tremors, focal weakness and weakness.  Endo/Heme/Allergies: Negative.   Psychiatric/Behavioral: Negative for depression.  All other systems reviewed and are negative.  Otherwise per HPI.  Assessment & Plan: Visit Diagnoses:  1. Chronic bilateral low back pain with bilateral sciatica   2. Radiculopathy due to lumbar intervertebral disc disorder   3. Spondylosis without myelopathy or radiculopathy, lumbar region   4. Bilateral stenosis of lateral recess of lumbar spine     Plan:  Findings:  3 months of worsening left more than right radicular leg pain but low back pain.  History of chronic low back pain for many years.  It like her back pain complaints are mechanical and she probably would do well with core strengthening and prolonged exercises for strengthening the spine.  MRI imaging does not show a bad spine at all but she does have a subarticular protrusion on the left at L5-S1 that I think correlates with her symptoms fairly well since it is left more than  right.  I think the next approach would be diagnostic S1 transforaminal injection either on the left or bilaterally.  I think a course of physical therapy with core strengthening would be wise as well.  Could consider chiropractic care.  She will continue with current medications.  She may want to increase her gabapentin slowly to 3 times per day.  This could be looked at with some chronic kidney disease that she has as far as creatinine clearance.  Probably would leave that up to Dr. walls at this point if she is managing the gabapentin.  We will see the patient for the injection.  Patient's course is complicated by significant rheumatologic problems with Sjogren's syndrome.  She also has allergies or intolerances to multiple SSRI type medications.  This would likely limit the use of a lot of the medications we use for nerve pain.    Meds & Orders: No orders of the defined types were placed in this encounter.  No orders of the defined types were placed in this encounter.   Follow-up: Return for Bilateral S1 transforaminal injections.   Procedures: No procedures performed  No notes on file   Clinical History: MRI LUMBAR SPINE WITHOUT CONTRAST  TECHNIQUE: Multiplanar, multisequence MR imaging of the lumbar spine was performed. No intravenous contrast was administered.  COMPARISON:  None.  FINDINGS: Segmentation:  Standard.  Alignment:  Physiologic.  Vertebrae: Multiple T1 and T2 hyperintense foci are present in the lumbar spine compatible with hemangiomata. No findings of fracture or discitis. Mild degenerative endplate edema at the L5-S1 level.  Conus medullaris and cauda equina: Conus extends to the T12-L1 level. Conus and cauda equina appear normal.  Paraspinal and other soft tissues: Negative.  Disc levels:  T12-L1: No significant disc displacement, foraminal stenosis, or canal stenosis.  L1-2: No significant disc displacement, foraminal stenosis, or  canal stenosis.  L2-3: No significant disc displacement, foraminal stenosis, or canal stenosis.  L3-4: No significant disc displacement, foraminal stenosis, or canal stenosis.  L4-5: Disc bulge with small central disc protrusion. Mild bilateral neural foraminal stenosis. No spinal canal stenosis. Trace facet effusions.  L5-S1: Disc bulge with small left subarticular disc protrusion. The disc protrusion contacts the descending left S1 nerve root in the lateral recess (series 5, image 9). There is mild bilateral neural foraminal stenosis. No significant spinal canal stenosis. Trace right facet effusion.  IMPRESSION: 1. No acute osseous abnormality or malalignment. 2. Mild spondylosis of the lumbar spine at the L4-5 and L5-S1 levels with there is mild bilateral foraminal stenosis. No canal stenosis. 3. Small L5-S1 left central disc protrusion contacts the descending left S1 nerve root in the left lateral recess.   Electronically Signed   By: Mitzi Hansen M.D.   On: 12/03/2018 13:59   She reports that she quit smoking about 11 years ago. Her smoking use included cigarettes. She quit after 10.00 years of use. She has never used smokeless tobacco. No results for input(s): HGBA1C, LABURIC  in the last 8760 hours.  Objective:  VS:  HT:5\' 9"  (175.3 cm)    WT:200 lb (90.7 kg)   BMI:29.52     BP:126/86   HR:66bpm   TEMP: ( )   RESP:  Physical Exam Vitals signs and nursing note reviewed.  Constitutional:      General: She is not in acute distress.    Appearance: Normal appearance. She is well-developed.  HENT:     Head: Normocephalic and atraumatic.     Nose: Nose normal.     Mouth/Throat:     Mouth: Mucous membranes are moist.     Pharynx: Oropharynx is clear.  Eyes:     Conjunctiva/sclera: Conjunctivae normal.     Pupils: Pupils are equal, round, and reactive to light.  Neck:     Musculoskeletal: Normal range of motion and neck supple.  Cardiovascular:      Rate and Rhythm: Regular rhythm.  Pulmonary:     Effort: Pulmonary effort is normal. No respiratory distress.  Abdominal:     General: There is no distension.     Palpations: Abdomen is soft.     Tenderness: There is no guarding.  Musculoskeletal:     Right lower leg: No edema.     Left lower leg: No edema.     Comments: Patient ambulates without aid with a slightly forward flexed lumbar spine.  She has pain with full extension of the lumbar spine and facet loading.  She has taut bands in the paraspinal and quadratus lumborum region.  Some tenderness across the PSIS bilaterally.  She has no pain really over the greater trochanter as well as mildly tender.  No pain with hip rotation.  Good distal strength without clonus.  Skin:    General: Skin is warm and dry.     Findings: No erythema or rash.  Neurological:     General: No focal deficit present.     Mental Status: She is alert and oriented to person, place, and time.     Motor: No abnormal muscle tone.     Coordination: Coordination normal.     Gait: Gait normal.  Psychiatric:        Mood and Affect: Mood normal.        Behavior: Behavior normal.        Thought Content: Thought content normal.     Ortho Exam Imaging: No results found.  Past Medical/Family/Surgical/Social History: Medications & Allergies reviewed per EMR, new medications updated. Patient Active Problem List   Diagnosis Date Noted   Chronic bilateral low back pain with bilateral sciatica 12/23/2018   Renal calculus 07/17/2018   Seasonal allergies 07/12/2018   Vitamin D deficiency 07/12/2018   Primary insomnia 07/12/2018   GAD (generalized anxiety disorder) 07/12/2018   Constipation 07/08/2018   Sjogren's syndrome (HCC) 07/08/2018   Chronic hypokalemia 07/08/2018   Orthostatic hypotension 07/08/2018   Gestational diabetes 07/08/2018   GERD (gastroesophageal reflux disease)    Ureteral stone with hydronephrosis 03/03/2018   AKI (acute kidney  injury) (HCC) 03/03/2018   Past Medical History:  Diagnosis Date   anxiety/panic attacks    Bladder spasms    Chicken pox    Chronic constipation    Chronic headaches    Complication of anesthesia    03-03-2018 post-op sx, pt complain chest pain, testing negative and cardiologist consult, dr hochrien note in epic dated 05-21 and 03-04-2018 stating atypical chest pain, not cardiac   Depression    Dizziness  Environmental allergies    GERD (gastroesophageal reflux disease)    Hemorrhoid    Hypertension    Hypokalemia, chronic    Orthostatic hypotension    per pt get orthostatic with changes of positions quickly --- treatment w/ metoprolol and compression stockings   Ovarian cyst    Paroxysmal ventricular tachycardia (HCC)    PVC (premature ventricular contraction)    intermittant PVC's , per pt symtomatic -- treated with metoprolol and potassium supplementation   Renal insufficiency    secondary to sjogrens syndrome--- per pt followed by nephrologist in [place]   Sjogren's syndrome [facility])    rheumotologist-  per pt is in [place]   SOB (shortness of breath)    Syncope and collapse    Family History  Problem Relation Age of Onset   Breast cancer Mother    Depression Mother    Rheum arthritis Paternal Grandmother    AAA (abdominal aortic aneurysm) Paternal Grandfather    Heart disease Sister    Lung cancer Maternal Grandfather    Past Surgical History:  Procedure Laterality Date   ABDOMINAL HYSTERECTOMY     PARTIAL   CESAREAN SECTION  x 3 LAST ONE 2012   W/ BILATERAL TUBLE LIGATION w/ last c/s   CYSTOSCOPY WITH STENT PLACEMENT Bilateral 03/03/2018   Procedure: CYSTOSCOPY WITH BILATERAL STENT PLACEMENT;  Surgeon: Bjorn Pippin, MD;  Location: [facility] OR;  Service: Urology;  Laterality: Bilateral;   CYSTOSCOPY/URETEROSCOPY/HOLMIUM LASER/STENT PLACEMENT Bilateral 03/17/2018   Procedure: CYSTOSCOPY BILATERAL URETEROSCOPY/HOLMIUM LASER/STENT PLACEMENT;  Surgeon:  Bjorn Pippin, MD;  [facility];  Service: Urology;  Laterality: Bilateral;   Social History   Occupational History   Occupation: Teacher, adult education: [employer]  Tobacco Use   Smoking status: Former Smoker    Years: 10.00    Types: Cigarettes    Last attempt to quit: 03/17/2007    Years since quitting: 11.8   Smokeless tobacco: Never Used  Substance and Sexual Activity   Alcohol use: Not Currently    Frequency: Never   Drug use: Never   Sexual activity: Not on file

## 2019-01-06 ENCOUNTER — Encounter: Payer: Self-pay | Admitting: Family Medicine

## 2019-01-06 ENCOUNTER — Other Ambulatory Visit: Payer: Self-pay

## 2019-01-06 ENCOUNTER — Ambulatory Visit (INDEPENDENT_AMBULATORY_CARE_PROVIDER_SITE_OTHER): Payer: Commercial Managed Care - PPO | Admitting: Family Medicine

## 2019-01-06 DIAGNOSIS — M5442 Lumbago with sciatica, left side: Secondary | ICD-10-CM

## 2019-01-06 DIAGNOSIS — M5441 Lumbago with sciatica, right side: Secondary | ICD-10-CM

## 2019-01-06 DIAGNOSIS — G8929 Other chronic pain: Secondary | ICD-10-CM

## 2019-01-06 DIAGNOSIS — M3500 Sicca syndrome, unspecified: Secondary | ICD-10-CM

## 2019-01-06 DIAGNOSIS — R252 Cramp and spasm: Secondary | ICD-10-CM | POA: Diagnosis not present

## 2019-01-06 MED ORDER — PREDNISONE 5 MG PO TABS: 5.0000 mg | ORAL_TABLET | Freq: Every day | ORAL | 0 refills | Status: DC

## 2019-01-06 MED ORDER — OXYCODONE-ACETAMINOPHEN 5-325 MG PO TABS: 1.0000 | ORAL_TABLET | Freq: Two times a day (BID) | ORAL | 0 refills | Status: DC | PRN

## 2019-01-06 NOTE — Progress Notes (Signed)
Virtual Visit via Video   I connected with [name] on 01/06/19 at 11:00 AM EDT by a video enabled telemedicine application and verified that I am speaking with the correct person using two identifiers. Location patient: Home Location provider: [facility], Office Persons participating in the virtual visit: Aoki D Gottwald, Helane RimaErica Tenoch Mcclure, DO, [name] as CMA scribe.  I discussed the limitations of evaluation and management by telemedicine and the availability of in person appointments. The patient expressed understanding and agreed to proceed.  Subjective:   HPI: Patient with chronic back pain, was supposed to have ESI this week but moved out due to COVID. Will need refill of Oxycodone. Not taking Neurontin TID consistently. Tries to avoid systemic steroids.  Works as Insurance underwriterCU nurse. On CellCept for autoimmune issues. I highly recommend that she not work in the hospital at this time. See letter.   MRI IMPRESSION: 1. No acute osseous abnormality or malalignment. 2. Mild spondylosis of the lumbar spine at the L4-5 and L5-S1 levels with there is mild bilateral foraminal stenosis. No canal stenosis. 3. Small L5-S1 left central disc protrusion contacts the descending left S1 nerve root in the left lateral recess.   FROM VISIT WITH PMR:  Visit Diagnoses:  1. Chronic bilateral low back pain with bilateral sciatica   2. Radiculopathy due to lumbar intervertebral disc disorder   3. Spondylosis without myelopathy or radiculopathy, lumbar region   4. Bilateral stenosis of lateral recess of lumbar spine     Plan:  3 months of worsening left more than right radicular leg pain but low back pain.  History of chronic low back pain for many years.  It like her back pain complaints are mechanical and she probably would do well with core strengthening and prolonged exercises for strengthening the spine.  MRI imaging does not show a bad spine at all but she does have a subarticular protrusion on the  left at L5-S1 that I think correlates with her symptoms fairly well since it is left more than right.  I think the next approach would be diagnostic S1 transforaminal injection either on the left or bilaterally.  I think a course of physical therapy with core strengthening would be wise as well.  Could consider chiropractic care.  She will continue with current medications.  She may want to increase her gabapentin slowly to 3 times per day.  This could be looked at with some chronic kidney disease that she has as far as creatinine clearance.  Probably would leave that up to Dr. walls at this point if she is managing the gabapentin.  We will see the patient for the injection.  Patient's course is complicated by significant rheumatologic problems with Sjogren's syndrome.  She also has allergies or intolerances to multiple SSRI type medications.  This would likely limit the use of a lot of the medications we use for nerve pain.  ROS: See pertinent positives and negatives per HPI.  Patient Active Problem List   Diagnosis Date Noted  . Chronic bilateral low back pain with bilateral sciatica 12/23/2018  . Renal calculus 07/17/2018  . Seasonal allergies 07/12/2018  . Vitamin D deficiency 07/12/2018  . Primary insomnia 07/12/2018  . GAD (generalized anxiety disorder) 07/12/2018  . Constipation 07/08/2018  . Sjogren's syndrome (HCC) 07/08/2018  . Chronic hypokalemia 07/08/2018  . Orthostatic hypotension 07/08/2018  . GERD (gastroesophageal reflux disease)   . Ureteral stone with hydronephrosis 03/03/2018    Social History   Tobacco Use  .  Smoking status: Former Smoker    Years: 10.00    Types: Cigarettes    Last attempt to quit: 03/17/2007    Years since quitting: 11.8  . Smokeless tobacco: Never Used  Substance Use Topics  . Alcohol use: Not Currently    Frequency: Never    Current Outpatient Medications:  .  B Complex-Biotin-FA (B COMPLETE PO), Take by mouth., Disp: , Rfl:  .   Cholecalciferol (VITAMIN D3) 5000 units CAPS, Take 5,000 Units by mouth daily., Disp: , Rfl:  .  diazepam (VALIUM) 5 MG tablet, Take 1 by mouth 1 hour  pre-procedure with very light food. May bring 2nd tablet to appointment., Disp: 2 tablet, Rfl: 0 .  diphenhydrAMINE (BENADRYL) 50 MG capsule, Take 50 mg by mouth at bedtime as needed for sleep., Disp: , Rfl:  .  gabapentin (NEURONTIN) 300 MG capsule, Take 1 capsule (300 mg total) by mouth 3 (three) times daily., Disp: 90 capsule, Rfl: 3 .  Glucosamine HCl 1500 MG TABS, Take 1,500 mg by mouth daily., Disp: , Rfl:  .  loratadine (CLARITIN) 10 MG tablet, Take 10 mg by mouth every evening. , Disp: , Rfl:  .  LORazepam (ATIVAN) 0.5 MG tablet, Ativan 0.5 mg tablet  Take 2 tablets 3 times a day by oral route., Disp: , Rfl:  .  methocarbamol (ROBAXIN) 500 MG tablet, Take 1 tablet (500 mg total) by mouth 4 (four) times daily., Disp: 20 tablet, Rfl: 0 .  metoprolol succinate (TOPROL-XL) 25 MG 24 hr tablet, Take 25 mg by mouth every morning. , Disp: , Rfl:  .  Multiple Vitamin (MULTIVITAMIN WITH MINERALS) TABS tablet, Take 1 tablet by mouth every evening. , Disp: , Rfl:  .  mycophenolate (CELLCEPT) 250 MG capsule, Take 250 mg by mouth 2 (two) times daily. Take with Cellcept 500 mg to equal 750 mg, Disp: , Rfl:  .  mycophenolate (CELLCEPT) 500 MG tablet, Take 500 mg by mouth 2 (two) times daily. Take with Cellcept 250 mg to equal 750 mg, Disp: , Rfl:  .  omeprazole (PRILOSEC) 20 MG capsule, Take 20 mg by mouth every morning. , Disp: , Rfl:  .  ondansetron (ZOFRAN) 8 MG tablet, Take 1 tablet (8 mg total) by mouth every 8 (eight) hours as needed for nausea or vomiting., Disp: 20 tablet, Rfl: 0 .  oxyCODONE-acetaminophen (PERCOCET/ROXICET) 5-325 MG tablet, Take 1-2 tablets by mouth every 8 (eight) hours as needed for severe pain., Disp: 30 tablet, Rfl: 0 .  Polyethylene Glycol 3350 (MIRALAX PO), Take by mouth daily as needed., Disp: , Rfl:  .  potassium chloride SA  (K-DUR,KLOR-CON) 20 MEQ tablet, Take 1 tablet (20 mEq total) by mouth 2 (two) times daily., Disp: 180 tablet, Rfl: 1 .  promethazine (PHENERGAN) 25 MG tablet, Take 1 tablet (25 mg total) by mouth every 6 (six) hours as needed for nausea or vomiting., Disp: 12 tablet, Rfl: 1 .  spironolactone (ALDACTONE) 25 MG tablet, Take 12.5 mg by mouth every morning. , Disp: , Rfl:  .  traZODone (DESYREL) 100 MG tablet, Take 1 tablet (100 mg total) by mouth at bedtime as needed for sleep., Disp: 90 tablet, Rfl: 1 .  Turmeric 500 MG CAPS, Take 500 mg by mouth daily., Disp: , Rfl:   Allergies  Allergen Reactions  . Cardizem [Diltiazem Hcl] Shortness Of Breath    And chest pain  . Citalopram Other (See Comments)    Has reaction to ALL SSRI's per patient cause suicidal  ideation  . Hydromorphone Hcl Shortness Of Breath  . Lexapro [Escitalopram Oxalate] Other (See Comments)    Has reaction to ALL SSRI's per patient cause suicidal ideation  . Paxil [Paroxetine Hcl] Other (See Comments)    Has reaction to ALL SSRI's per patient cause suicidal ideation  . Prozac [Fluoxetine] Other (See Comments)    Has reaction to ALL SSRI's per patient cause suicidal ideation  . Zoloft [Sertraline] Other (See Comments)    Has reaction to ALL SSRI's per patient cause suicidal ideation  . Tape Rash    Objective:   VITALS: Per patient if applicable, see vitals. GENERAL: Alert, appears well and in no acute distress. HEENT: Atraumatic, conjunctiva clear, no obvious abnormalities on inspection of external nose and ears. NECK: Normal movements of the head and neck. CARDIOPULMONARY: No increased WOB. Speaking in clear sentences. I:E ratio WNL.  MS: Moves all visible extremities without noticeable abnormality. PSYCH: Pleasant and cooperative, well-groomed. Speech normal rate and rhythm. Affect is appropriate. Insight and judgement are appropriate. Attention is focused, linear, and appropriate.  NEURO: CN grossly intact. Oriented  as arrived to appointment on time with no prompting. Moves both UE equally.  SKIN: No obvious lesions, wounds, erythema, or cyanosis noted on face or hands.  Assessment and Plan:   Tedi was seen today for follow-up.  Diagnoses and all orders for this visit:  Sjogren's syndrome, with unspecified organ involvement (HCC) Comments: Will completed needed paperwork to be home.   Chronic bilateral low back pain with bilateral sciatica -     oxyCODONE-acetaminophen (PERCOCET/ROXICET) 5-325 MG tablet; Take 1-2 tablets by mouth 2 (two) times daily as needed for severe pain. -     predniSONE (DELTASONE) 5 MG tablet; Take 1 tablet (5 mg total) by mouth daily with breakfast. 6-5-4-3-2-1  Leg cramps Comments: Thought to be radiculopathy.   Orders: -     oxyCODONE-acetaminophen (PERCOCET/ROXICET) 5-325 MG tablet; Take 1-2 tablets by mouth 2 (two) times daily as needed for severe pain. -     predniSONE (DELTASONE) 5 MG tablet; Take 1 tablet (5 mg total) by mouth daily with breakfast. 6-5-4-3-2-1    . Reviewed expectations re: course of current medical issues. . Discussed self-management of symptoms. . Outlined signs and symptoms indicating need for more acute intervention. . Patient verbalized understanding and all questions were answered. Marland Kitchen Health Maintenance issues including appropriate healthy diet, exercise, and smoking avoidance were discussed with patient. . See orders for this visit as documented in the electronic medical record.  Helane Rima, DO 01/06/2019

## 2019-01-08 ENCOUNTER — Encounter (INDEPENDENT_AMBULATORY_CARE_PROVIDER_SITE_OTHER): Payer: Self-pay | Admitting: Physical Medicine and Rehabilitation

## 2019-01-10 ENCOUNTER — Other Ambulatory Visit: Payer: Self-pay | Admitting: Family Medicine

## 2019-01-10 DIAGNOSIS — R252 Cramp and spasm: Secondary | ICD-10-CM

## 2019-01-10 DIAGNOSIS — M5442 Lumbago with sciatica, left side: Secondary | ICD-10-CM

## 2019-01-10 DIAGNOSIS — M5441 Lumbago with sciatica, right side: Secondary | ICD-10-CM

## 2019-01-10 DIAGNOSIS — G8929 Other chronic pain: Secondary | ICD-10-CM

## 2019-01-21 ENCOUNTER — Other Ambulatory Visit: Payer: Self-pay | Admitting: Family Medicine

## 2019-01-21 NOTE — Telephone Encounter (Signed)
Last OV 01/06/2019 Last refill 07/30/2018 #90/1 Next OV not scheduled

## 2019-01-22 ENCOUNTER — Encounter: Payer: Self-pay | Admitting: Family Medicine

## 2019-01-27 ENCOUNTER — Encounter (INDEPENDENT_AMBULATORY_CARE_PROVIDER_SITE_OTHER): Payer: Self-pay | Admitting: Physical Medicine and Rehabilitation

## 2019-02-04 ENCOUNTER — Other Ambulatory Visit: Payer: Self-pay | Admitting: Family Medicine

## 2019-02-04 DIAGNOSIS — G8929 Other chronic pain: Secondary | ICD-10-CM

## 2019-02-04 DIAGNOSIS — M5442 Lumbago with sciatica, left side: Secondary | ICD-10-CM

## 2019-02-04 DIAGNOSIS — R252 Cramp and spasm: Secondary | ICD-10-CM

## 2019-02-04 DIAGNOSIS — M5441 Lumbago with sciatica, right side: Secondary | ICD-10-CM

## 2019-02-04 MED ORDER — OXYCODONE-ACETAMINOPHEN 5-325 MG PO TABS: 1.0000 | ORAL_TABLET | Freq: Two times a day (BID) | ORAL | 0 refills | Status: DC | PRN

## 2019-02-04 NOTE — Telephone Encounter (Signed)
Last app 01/06/2019  Given #60 no r/f  Does have app with [facility] in may no f/u with [place]

## 2019-02-19 ENCOUNTER — Other Ambulatory Visit: Payer: Self-pay

## 2019-02-19 ENCOUNTER — Encounter: Payer: Self-pay | Admitting: Family Medicine

## 2019-02-19 ENCOUNTER — Ambulatory Visit (INDEPENDENT_AMBULATORY_CARE_PROVIDER_SITE_OTHER): Payer: Commercial Managed Care - PPO | Admitting: Family Medicine

## 2019-02-19 VITALS — HR 73 | Temp 96.2°F | Ht 69.0 in | Wt 210.0 lb

## 2019-02-19 DIAGNOSIS — M5441 Lumbago with sciatica, right side: Secondary | ICD-10-CM

## 2019-02-19 DIAGNOSIS — M5442 Lumbago with sciatica, left side: Secondary | ICD-10-CM

## 2019-02-19 DIAGNOSIS — M3500 Sicca syndrome, unspecified: Secondary | ICD-10-CM | POA: Diagnosis not present

## 2019-02-19 DIAGNOSIS — G8929 Other chronic pain: Secondary | ICD-10-CM | POA: Diagnosis not present

## 2019-02-19 NOTE — Progress Notes (Signed)
Virtual Visit via Video   Due to the COVID-19 pandemic, this visit was completed with telemedicine (audio/video) technology to reduce patient and provider exposure as well as to preserve personal protective equipment.   I connected with [name] by a video enabled telemedicine application and verified that I am speaking with the correct person using two identifiers. Location patient: Home Location provider: [facility], Office Persons participating in the virtual visit: Edom D Hurlock, Helane Rimarica Delon Revelo, DO [name], CMA acting as scribe for Dr. Helane RimaErica Westyn Driggers.   I discussed the limitations of evaluation and management by telemedicine and the availability of in person appointments. The patient expressed understanding and agreed to proceed.  Care Team   Patient Care Team: Helane RimaWallace, Jocsan Mcginley, DO as PCP - General (Family Medicine) Bjorn PippinWrenn, John, MD as Attending Physician (Urology)  Subjective:   HPI: Epidural scheduled for next week and is hopeful that it will be able to happen. She has had good and bad days. Right now the medications are helping. No side effects.  Work notes: She will e mail form to us to fill out so that we can make sure that she can avoid direct patient care. On Cellcept, should not be exposed to COVID-19 patients.   Review of Systems  Constitutional: Negative for chills and fever.  HENT: Negative for hearing loss.   Eyes: Negative for blurred vision and double vision.  Respiratory: Negative for cough.   Cardiovascular: Negative for chest pain and palpitations.  Gastrointestinal: Negative for heartburn.  Genitourinary: Negative for dysuria and urgency.  Musculoskeletal: Negative for myalgias.  Skin: Negative for rash.  Neurological: Negative for dizziness.  Endo/Heme/Allergies: Does not bruise/bleed easily.  Psychiatric/Behavioral: Negative for depression.     Patient Active Problem List   Diagnosis Date Noted  . Chronic bilateral low back pain with  bilateral sciatica 12/23/2018  . Renal calculus 07/17/2018  . Seasonal allergies 07/12/2018  . Vitamin D deficiency 07/12/2018  . Primary insomnia 07/12/2018  . GAD (generalized anxiety disorder) 07/12/2018  . Constipation 07/08/2018  . Sjogren's syndrome (HCC) 07/08/2018  . Chronic hypokalemia 07/08/2018  . Orthostatic hypotension 07/08/2018  . GERD (gastroesophageal reflux disease)   . Ureteral stone with hydronephrosis 03/03/2018    Social History   Tobacco Use  . Smoking status: Former Smoker    Years: 10.00    Types: Cigarettes    Last attempt to quit: 03/17/2007    Years since quitting: 11.9  . Smokeless tobacco: Never Used  Substance Use Topics  . Alcohol use: Not Currently    Frequency: Never    Current Outpatient Medications:  .  B Complex-Biotin-FA (B COMPLETE PO), Take by mouth., Disp: , Rfl:  .  Cholecalciferol (VITAMIN D3) 5000 units CAPS, Take 5,000 Units by mouth daily., Disp: , Rfl:  .  diphenhydrAMINE (BENADRYL) 50 MG capsule, Take 50 mg by mouth at bedtime as needed for sleep., Disp: , Rfl:  .  gabapentin (NEURONTIN) 300 MG capsule, TAKE 1 CAPSULE BY MOUTH THREE TIMES A DAY, Disp: 270 capsule, Rfl: 2 .  Glucosamine HCl 1500 MG TABS, Take 1,500 mg by mouth daily., Disp: , Rfl:  .  loratadine (CLARITIN) 10 MG tablet, Take 10 mg by mouth every evening. , Disp: , Rfl:  .  methocarbamol (ROBAXIN) 500 MG tablet, Take 1 tablet (500 mg total) by mouth 4 (four) times daily., Disp: 20 tablet, Rfl: 0 .  metoprolol succinate (TOPROL-XL) 25 MG 24 hr tablet, Take 25 mg by mouth every morning. ,  Disp: , Rfl:  .  mycophenolate (CELLCEPT) 250 MG capsule, Take 250 mg by mouth 2 (two) times daily. Take with Cellcept 500 mg to equal 750 mg, Disp: , Rfl:  .  mycophenolate (CELLCEPT) 500 MG tablet, Take 500 mg by mouth 2 (two) times daily. Take with Cellcept 250 mg to equal 750 mg, Disp: , Rfl:  .  omeprazole (PRILOSEC) 20 MG capsule, Take 20 mg by mouth every morning. , Disp: , Rfl:    .  ondansetron (ZOFRAN) 8 MG tablet, Take 1 tablet (8 mg total) by mouth every 8 (eight) hours as needed for nausea or vomiting., Disp: 20 tablet, Rfl: 0 .  oxyCODONE-acetaminophen (PERCOCET/ROXICET) 5-325 MG tablet, Take 1-2 tablets by mouth 2 (two) times daily as needed for severe pain., Disp: 60 tablet, Rfl: 0 .  Polyethylene Glycol 3350 (MIRALAX PO), Take by mouth daily as needed., Disp: , Rfl:  .  potassium chloride SA (K-DUR,KLOR-CON) 20 MEQ tablet, Take 1 tablet (20 mEq total) by mouth 2 (two) times daily., Disp: 180 tablet, Rfl: 1 .  predniSONE (DELTASONE) 5 MG tablet, Take 1 tablet (5 mg total) by mouth daily with breakfast. 6-5-4-3-2-1, Disp: 21 tablet, Rfl: 0 .  promethazine (PHENERGAN) 25 MG tablet, Take 1 tablet (25 mg total) by mouth every 6 (six) hours as needed for nausea or vomiting., Disp: 12 tablet, Rfl: 1 .  spironolactone (ALDACTONE) 25 MG tablet, Take 12.5 mg by mouth every morning. , Disp: , Rfl:  .  traZODone (DESYREL) 100 MG tablet, TAKE 1 TABLET (100 MG TOTAL) BY MOUTH AT BEDTIME AS NEEDED FOR SLEEP., Disp: 90 tablet, Rfl: 1 .  Turmeric 500 MG CAPS, Take 500 mg by mouth daily., Disp: , Rfl:   Allergies  Allergen Reactions  . Cardizem [Diltiazem Hcl] Shortness Of Breath    And chest pain  . Citalopram Other (See Comments)    Has reaction to ALL SSRI's per patient cause suicidal ideation  . Hydromorphone Hcl Shortness Of Breath  . Lexapro [Escitalopram Oxalate] Other (See Comments)    Has reaction to ALL SSRI's per patient cause suicidal ideation  . Paxil [Paroxetine Hcl] Other (See Comments)    Has reaction to ALL SSRI's per patient cause suicidal ideation  . Prozac [Fluoxetine] Other (See Comments)    Has reaction to ALL SSRI's per patient cause suicidal ideation  . Zoloft [Sertraline] Other (See Comments)    Has reaction to ALL SSRI's per patient cause suicidal ideation  . Tape Rash    Objective:   VITALS: Per patient if applicable, see vitals. GENERAL:  Alert, appears well and in no acute distress. HEENT: Atraumatic, conjunctiva clear, no obvious abnormalities on inspection of external nose and ears. NECK: Normal movements of the head and neck. CARDIOPULMONARY: No increased WOB. Speaking in clear sentences. I:E ratio WNL.  MS: Moves all visible extremities without noticeable abnormality. PSYCH: Pleasant and cooperative, well-groomed. Speech normal rate and rhythm. Affect is appropriate. Insight and judgement are appropriate. Attention is focused, linear, and appropriate.  NEURO: CN grossly intact. Oriented as arrived to appointment on time with no prompting. Moves both UE equally.  SKIN: No obvious lesions, wounds, erythema, or cyanosis noted on face or hands.  Depression screen PHQ 2/9 07/12/2018  Decreased Interest 0  Down, Depressed, Hopeless 0  PHQ - 2 Score 0    Assessment and Plan:   [name] was seen today for follow-up.  Diagnoses and all orders for this visit:  Chronic bilateral low back pain with  bilateral sciatica Comments: Injection next week. Continue current treatment until then.  Sjogren's syndrome, with unspecified organ involvement (HCC) Comments: Will complete work forms. Patient high risk.    Marland Kitchen COVID-19 Education: The signs and symptoms of COVID-19 were discussed with the patient and how to seek care for testing if needed. The importance of social distancing was discussed today. . Reviewed expectations re: course of current medical issues. . Discussed self-management of symptoms. . Outlined signs and symptoms indicating need for more acute intervention. . Patient verbalized understanding and all questions were answered. Marland Kitchen Health Maintenance issues including appropriate healthy diet, exercise, and smoking avoidance were discussed with patient. . See orders for this visit as documented in the electronic medical record.  Helane Rima, DO  Records requested if needed. Time spent: 25 minutes, of which >50% was  spent in obtaining information about her symptoms, reviewing her previous labs, evaluations, and treatments, counseling her about her condition (please see the discussed topics above), and developing a plan to further investigate it; she had a number of questions which I addressed.

## 2019-02-21 ENCOUNTER — Encounter: Payer: Self-pay | Admitting: Family Medicine

## 2019-02-22 ENCOUNTER — Encounter: Payer: Self-pay | Admitting: Physical Medicine and Rehabilitation

## 2019-02-22 ENCOUNTER — Ambulatory Visit (INDEPENDENT_AMBULATORY_CARE_PROVIDER_SITE_OTHER): Payer: Commercial Managed Care - PPO | Admitting: Physical Medicine and Rehabilitation

## 2019-02-22 ENCOUNTER — Ambulatory Visit: Payer: Self-pay

## 2019-02-22 VITALS — BP 113/58 | HR 65 | Temp 98.2°F

## 2019-02-22 DIAGNOSIS — M5116 Intervertebral disc disorders with radiculopathy, lumbar region: Secondary | ICD-10-CM | POA: Diagnosis not present

## 2019-02-22 DIAGNOSIS — M5416 Radiculopathy, lumbar region: Secondary | ICD-10-CM

## 2019-02-22 MED ORDER — BETAMETHASONE SOD PHOS & ACET 6 (3-3) MG/ML IJ SUSP
12.0000 mg | Freq: Once | INTRAMUSCULAR | Status: AC
  Administered 2019-02-22: 12 mg

## 2019-02-22 NOTE — Progress Notes (Signed)
Numeric Pain Rating Scale and Functional Assessment Average Pain 3   In the last MONTH (on 0-10 scale) has pain interfered with the following?  1. General activity like being  able to carry out your everyday physical activities such as walking, climbing stairs, carrying groceries, or moving a chair?  Rating(5)     +Driver, -BT, -Dye Allergies.  

## 2019-02-22 NOTE — Procedures (Signed)
S1 Lumbosacral Transforaminal Epidural Steroid Injection - Sub-Pedicular Approach with Fluoroscopic Guidance   Patient: [name]      Date of Birth: Aug 27, 1976 MRN: [id number] PCP: Helane Rima, DO      Visit Date: 02/22/2019   Universal Protocol:    Date/Time: 05/11/209:31 AM  Consent Given By: the patient  Position:  PRONE  Additional Comments: Vital signs were monitored before and after the procedure. Patient was prepped and draped in the usual sterile fashion. The correct patient, procedure, and site was verified.   Injection Procedure Details:  Procedure Site One Meds Administered:  Meds ordered this encounter  Medications  . betamethasone acetate-betamethasone sodium phosphate (CELESTONE) injection 12 mg    Laterality: Bilateral  Location/Site:  S1 Foramen   Needle size: 22 ga.  Needle type: Spinal  Needle Placement: Transforaminal  Findings:   -Comments: Excellent flow of contrast along the nerve and into the epidural space.  Procedure Details: After squaring off the sacral end-plate to get a true AP view, the C-arm was positioned so that the best possible view of the S1 foramen was visualized. The soft tissues overlying this structure were infiltrated with 2-3 ml. of 1% Lidocaine without Epinephrine.    The spinal needle was inserted toward the target using a "trajectory" view along the fluoroscope beam.  Under AP and lateral visualization, the needle was advanced so it did not puncture dura. Biplanar projections were used to confirm position. Aspiration was confirmed to be negative for CSF and/or blood. A 1-2 ml. volume of Isovue-250 was injected and flow of contrast was noted at each level. Radiographs were obtained for documentation purposes.   After attaining the desired flow of contrast documented above, a 0.5 to 1.0 ml test dose of 0.25% Marcaine was injected into each respective transforaminal space.  The patient was observed for 90 seconds post  injection.  After no sensory deficits were reported, and normal lower extremity motor function was noted,   the above injectate was administered so that equal amounts of the injectate were placed at each foramen (level) into the transforaminal epidural space.   Additional Comments:  The patient tolerated the procedure well Dressing: Band-Aid with 2 x 2 sterile gauze    Post-procedure details: Patient was observed during the procedure. Post-procedure instructions were reviewed.  Patient left the clinic in stable condition.

## 2019-02-22 NOTE — Progress Notes (Signed)
Jessica Richisha D Quinones - 43 y.o. female MRN [id number]  Date of birth: Jun 14, 1976  Office Visit Note: Visit Date: 02/22/2019 PCP: Helane RimaWallace, Erica, DO Referred by: Helane RimaWallace, Erica, DO  Subjective: Chief Complaint  Patient presents with  . Lower Back - Pain  . Right Leg - Pain  . Left Leg - Pain   HPI:  [name] is a 43 y.o. female who comes in today For planned bilateral S1 transforaminal epidural steroid injection.  Please see our prior notes for further details and justification.  Patient does have severe low back pain radiates into both legs all the way down posteriorly.  Her MRI does not necessarily fit with her symptoms and totality.  She does have very small disc herniation protrusion at L5-S1 with some general spondylosis.  She does see a rheumatologist and she is seeing them next week.  It would be appropriate for her ask about her sacroiliac joints and evaluation from them.  That could be reason for back pain and just not showing up on the MRI.  Diagnostically though I think the injection would go a long way and hopefully helping her figure out where the pain generator is from.  She really talks about not being able to go back to work because of back pain but again her lumbar spine does not show anything from a severe standpoint there is some spondylosis and there is small disc protrusion there is no stenosis or focal compression of the nerves there is no listhesis.  Treatment plan should include core strengthening physical therapy as well as appropriate medication management potential injection like we did today.  Patient's course complicated by history of anxiety depression and rheumatologic disorder.  ROS Otherwise per HPI.  Assessment & Plan: Visit Diagnoses:  1. Radiculopathy due to lumbar intervertebral disc disorder   2. Lumbar radiculopathy     Plan: No additional findings.   Meds & Orders:  Meds ordered this encounter  Medications  . betamethasone acetate-betamethasone sodium  phosphate (CELESTONE) injection 12 mg    Orders Placed This Encounter  Procedures  . XR C-ARM NO REPORT  . Epidural Steroid injection    Follow-up: Return if symptoms worsen or fail to improve.   Procedures: No procedures performed  S1 Lumbosacral Transforaminal Epidural Steroid Injection - Sub-Pedicular Approach with Fluoroscopic Guidance   Patient: [name]      Date of Birth: Jun 14, 1976 MRN: [id number] PCP: Helane RimaWallace, Erica, DO      Visit Date: 02/22/2019   Universal Protocol:    Date/Time: 05/11/209:31 AM  Consent Given By: the patient  Position:  PRONE  Additional Comments: Vital signs were monitored before and after the procedure. Patient was prepped and draped in the usual sterile fashion. The correct patient, procedure, and site was verified.   Injection Procedure Details:  Procedure Site One Meds Administered:  Meds ordered this encounter  Medications  . betamethasone acetate-betamethasone sodium phosphate (CELESTONE) injection 12 mg    Laterality: Bilateral  Location/Site:  S1 Foramen   Needle size: 22 ga.  Needle type: Spinal  Needle Placement: Transforaminal  Findings:   -Comments: Excellent flow of contrast along the nerve and into the epidural space.  Procedure Details: After squaring off the sacral end-plate to get a true AP view, the C-arm was positioned so that the best possible view of the S1 foramen was visualized. The soft tissues overlying this structure were infiltrated with 2-3 ml. of 1% Lidocaine without Epinephrine.    The spinal needle  was inserted toward the target using a "trajectory" view along the fluoroscope beam.  Under AP and lateral visualization, the needle was advanced so it did not puncture dura. Biplanar projections were used to confirm position. Aspiration was confirmed to be negative for CSF and/or blood. A 1-2 ml. volume of Isovue-250 was injected and flow of contrast was noted at each level. Radiographs were obtained  for documentation purposes.   After attaining the desired flow of contrast documented above, a 0.5 to 1.0 ml test dose of 0.25% Marcaine was injected into each respective transforaminal space.  The patient was observed for 90 seconds post injection.  After no sensory deficits were reported, and normal lower extremity motor function was noted,   the above injectate was administered so that equal amounts of the injectate were placed at each foramen (level) into the transforaminal epidural space.   Additional Comments:  The patient tolerated the procedure well Dressing: Band-Aid with 2 x 2 sterile gauze    Post-procedure details: Patient was observed during the procedure. Post-procedure instructions were reviewed.  Patient left the clinic in stable condition.    Clinical History: MRI LUMBAR SPINE WITHOUT CONTRAST  TECHNIQUE: Multiplanar, multisequence MR imaging of the lumbar spine was performed. No intravenous contrast was administered.  COMPARISON:  None.  FINDINGS: Segmentation:  Standard.  Alignment:  Physiologic.  Vertebrae: Multiple T1 and T2 hyperintense foci are present in the lumbar spine compatible with hemangiomata. No findings of fracture or discitis. Mild degenerative endplate edema at the L5-S1 level.  Conus medullaris and cauda equina: Conus extends to the T12-L1 level. Conus and cauda equina appear normal.  Paraspinal and other soft tissues: Negative.  Disc levels:  T12-L1: No significant disc displacement, foraminal stenosis, or canal stenosis.  L1-2: No significant disc displacement, foraminal stenosis, or canal stenosis.  L2-3: No significant disc displacement, foraminal stenosis, or canal stenosis.  L3-4: No significant disc displacement, foraminal stenosis, or canal stenosis.  L4-5: Disc bulge with small central disc protrusion. Mild bilateral neural foraminal stenosis. No spinal canal stenosis. Trace facet effusions.  L5-S1:  Disc bulge with small left subarticular disc protrusion. The disc protrusion contacts the descending left S1 nerve root in the lateral recess (series 5, image 9). There is mild bilateral neural foraminal stenosis. No significant spinal canal stenosis. Trace right facet effusion.  IMPRESSION: 1. No acute osseous abnormality or malalignment. 2. Mild spondylosis of the lumbar spine at the L4-5 and L5-S1 levels with there is mild bilateral foraminal stenosis. No canal stenosis. 3. Small L5-S1 left central disc protrusion contacts the descending left S1 nerve root in the left lateral recess.   Electronically Signed   By: Mitzi Hansen M.D.   On: 12/03/2018 13:59     Objective:  VS:  HT:    WT:   BMI:     BP:(!) 113/58  HR:65bpm  TEMP:98.2 F (36.8 C)(Oral)  RESP:  Physical Exam  Ortho Exam Imaging: No results found.

## 2019-02-22 NOTE — Telephone Encounter (Signed)
Form has been printed.

## 2019-02-25 ENCOUNTER — Other Ambulatory Visit: Payer: Self-pay | Admitting: Family Medicine

## 2019-02-25 DIAGNOSIS — G8929 Other chronic pain: Secondary | ICD-10-CM

## 2019-02-25 DIAGNOSIS — R252 Cramp and spasm: Secondary | ICD-10-CM

## 2019-02-25 DIAGNOSIS — M5441 Lumbago with sciatica, right side: Secondary | ICD-10-CM

## 2019-02-26 ENCOUNTER — Telehealth: Payer: Self-pay

## 2019-02-26 DIAGNOSIS — G8929 Other chronic pain: Secondary | ICD-10-CM

## 2019-02-26 DIAGNOSIS — N200 Calculus of kidney: Secondary | ICD-10-CM | POA: Diagnosis not present

## 2019-02-26 DIAGNOSIS — R252 Cramp and spasm: Secondary | ICD-10-CM

## 2019-02-26 DIAGNOSIS — M5442 Lumbago with sciatica, left side: Secondary | ICD-10-CM

## 2019-02-26 NOTE — Telephone Encounter (Signed)
Called patient all have been addressed no more questions.

## 2019-02-26 NOTE — Telephone Encounter (Signed)
Forwarding to Dr. Wallace.  

## 2019-02-27 MED ORDER — OXYCODONE-ACETAMINOPHEN 5-325 MG PO TABS: 1.0000 | ORAL_TABLET | Freq: Two times a day (BID) | ORAL | 0 refills | Status: DC | PRN

## 2019-02-27 NOTE — Telephone Encounter (Signed)
Prescription sent. Helane Rima, DO

## 2019-03-02 ENCOUNTER — Encounter: Payer: Self-pay | Admitting: Family Medicine

## 2019-03-03 DIAGNOSIS — M3500 Sicca syndrome, unspecified: Secondary | ICD-10-CM | POA: Diagnosis not present

## 2019-03-03 LAB — BASIC METABOLIC PANEL
BUN: 21 (ref 4–21)
Creatinine: 1 (ref 0.5–1.1)
Glucose: 74
Potassium: 3.6 (ref 3.4–5.3)
Sodium: 139 (ref 137–147)

## 2019-03-03 LAB — CBC AND DIFFERENTIAL
HCT: 40 (ref 36–46)
Hemoglobin: 13.8 (ref 12.0–16.0)
Neutrophils Absolute: 3
Platelets: 161 (ref 150–399)
WBC: 3.9

## 2019-03-03 LAB — HEPATIC FUNCTION PANEL
ALT: 14 (ref 7–35)
AST: 17 (ref 13–35)
Alkaline Phosphatase: 67 (ref 25–125)
Bilirubin, Total: 0.2

## 2019-03-16 ENCOUNTER — Encounter: Payer: Self-pay | Admitting: Physical Medicine and Rehabilitation

## 2019-03-17 ENCOUNTER — Other Ambulatory Visit: Payer: Self-pay | Admitting: Family Medicine

## 2019-03-17 DIAGNOSIS — G8929 Other chronic pain: Secondary | ICD-10-CM

## 2019-03-17 DIAGNOSIS — M5441 Lumbago with sciatica, right side: Secondary | ICD-10-CM

## 2019-03-17 DIAGNOSIS — R252 Cramp and spasm: Secondary | ICD-10-CM

## 2019-03-17 NOTE — Telephone Encounter (Signed)
Last OV 02/19/19 Last refill Oxycodone 02/27/19 #60/0                 Methocarbamol 12/25/18 #20/0 Next OV not scheduled

## 2019-03-18 MED ORDER — OXYCODONE-ACETAMINOPHEN 5-325 MG PO TABS: 1.0000 | ORAL_TABLET | Freq: Two times a day (BID) | ORAL | 0 refills | Status: DC | PRN

## 2019-03-18 MED ORDER — METHOCARBAMOL 500 MG PO TABS: 500.0000 mg | ORAL_TABLET | Freq: Four times a day (QID) | ORAL | 0 refills | Status: DC

## 2019-03-23 ENCOUNTER — Other Ambulatory Visit: Payer: Self-pay | Admitting: Urology

## 2019-04-06 ENCOUNTER — Other Ambulatory Visit: Payer: Self-pay | Admitting: Urology

## 2019-04-15 ENCOUNTER — Encounter (HOSPITAL_COMMUNITY): Payer: Self-pay | Admitting: *Deleted

## 2019-04-15 NOTE — Progress Notes (Signed)
Spoke to patient via phone,history obtained,updated.  Bring blue folder,insurance cards,picture ID,designated driver and living will,POA, if desires (to be placed on chart). Reinforced no aspirin(instructions to hold aspirin per your doctor), ibuprofen products 72 hours prior to procedure. No vitamins or herbal medicines 7 days prior to procedure.   Follow laxative instructions provided by urologist (office) and in blue folder. Wear easy on/off clothing and no jewelry except wedding rings and ear rings. Leave all other valuables at home. Verbalizes understanding of instructions  No alcohol 24 hours prior to procedure.call md office if you have a cold,sorethroat or fever. Shower or bathe before your treatment. You may have clear liquids up to 4 hours prior to treatment. No solids after midnight. No family members may enter the hospital at this time. They will be called to pick you up after the procedure. You must wear a mask when entering the hospital.

## 2019-04-16 ENCOUNTER — Other Ambulatory Visit: Payer: Self-pay | Admitting: Family Medicine

## 2019-04-16 DIAGNOSIS — G8929 Other chronic pain: Secondary | ICD-10-CM

## 2019-04-16 DIAGNOSIS — R252 Cramp and spasm: Secondary | ICD-10-CM

## 2019-04-16 DIAGNOSIS — M5442 Lumbago with sciatica, left side: Secondary | ICD-10-CM

## 2019-04-19 MED ORDER — ONDANSETRON HCL 8 MG PO TABS: 8.0000 mg | ORAL_TABLET | Freq: Three times a day (TID) | ORAL | 0 refills | Status: AC | PRN

## 2019-04-19 MED ORDER — OXYCODONE-ACETAMINOPHEN 5-325 MG PO TABS: 1.0000 | ORAL_TABLET | Freq: Two times a day (BID) | ORAL | 0 refills | Status: DC | PRN

## 2019-04-20 NOTE — H&P (Signed)
I have kidney stones.  HPI: [name] is a 43 year-old female established patient who is here for renal calculi.  The problem is on the right side. She is currently having back pain and nausea. She denies having flank pain, groin pain, vomiting, fever, and chills.   She has had eswl for treatment of her stones in the past.   03/22/19: [name] returns today in f/u for her history of stones. She has back pain from a spinal issue and has had some recent nausea. She has had no hematuria and had recent labs on 5/20 with a Cr of 1.04. The KUB today shows bilateral renal stones with interval growth and the right renal pelvic stone is now about 7.835mm. She had bilateral stones treated with staged URS last May and June. She is currently getting chiropractic care for her back.     ALLERGIES: Cardizem Dilaudid - " respiratory distress" tapes    MEDICATIONS: Prilosec  Aldactazide 25 mg-25 mg tablet 1/2 tablet PO Daily  Benadryl  Cellcept  Glucosamine  Loratadine 10 mg capsule  Methionine 500 Mg Pill  Methocarbamol 500 mg tablet  Multiple Vitamin  Neurontin  Oxycodone-Acetaminophen 5 mg-325 mg tablet  Potassium Chloride  Promethazine Hcl  Toprol Xl 25 mg tablet, extended release 24 hr  Tramadol Hcl  Trazodone Hcl  Tumeric  Vitamin B12  Vitamin D2  Zofran     GU PSH: Cystoscopy Insert Stent, Bilateral - 03/03/2018 Ureteroscopic laser litho, Bilateral - 03/17/2018      PSH Notes: C-section X3. Partial hysterectomy in 2014. Exp. Lap in 2016. Renal stents on 03/03/2018. ESWL on 03/17/2018.  steroid injections in spine     NON-GU PSH: None   GU PMH: Dysuria - 07/29/2018 Renal calculus - 07/29/2018, She has bilateral renal stones on KUB and mild left hydro on US. She could be passing a small stone with associated nausea. I am going to give her tamsulosin and reviewed the side effects. She will return in 2 weeks and if the symptoms persist, we will need to get a CT. , - 07/15/2018, She did have  residual renal stones on Renal US at her last visit. , - 07/09/2018, [name] has residual renal stones and had Calcium Phosphate as her primary stone. I am going to get a hypercalcuria profile and 2 24 hr urines for stone risk and will have her return in 3 months with a KUB. I have given her information on stone prevention. , - 05/21/2018, - 04/09/2018, - 03/25/2018 History of urolithiasis, She has distal tubuler acidosis from her Sjogren's syndrome with associated hypocitraturia. She has excellent urine volume and she has been given recommendations from her nephrologist to try to mitigate the acidosis with a change to aldactazide 25-25 1/2 tab daily and the use of L-methionine. I have encouraged her to maintain her high urine output and I will have her f/u with a repeat 24 hr urine collection and KUB in 6 months. UA today has microhematuria. - 07/09/2018 Hypocitraturia, She is not a candidate for potassium citrate because of the higher pH and presence of calcium apatite stones. - 07/09/2018 Other disorders resulting from impaired renal tubular function - 07/09/2018    NON-GU PMH: Nausea - 07/15/2018 Anxiety Arrhythmia GERD Sicca syndrome, unspecified    FAMILY HISTORY: Breast Cancer - Mother   SOCIAL HISTORY: Marital Status: Married Preferred Language: English; Ethnicity: Not Hispanic Or Latino; Race: White Current Smoking Status: Patient does not smoke anymore. Smoked for 10 years.   Tobacco  Use Assessment Completed: Used Tobacco in last 30 days? Social Drinker.  Drinks 1 caffeinated drink per day. 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    REVIEW OF SYSTEMS:    GU Review Female:   Patient denies frequent urination, hard to postpone urination, burning /pain with urination, get up at night to urinate, leakage of urine, stream starts and stops, trouble starting your stream, have to strain to urinate, and being pregnant.  Gastrointestinal (Upper):   Patient denies nausea, vomiting, and  indigestion/ heartburn.  Gastrointestinal (Lower):   Patient denies diarrhea and constipation.  Constitutional:   Patient denies fever, night sweats, weight loss, and fatigue.  Skin:   Patient denies skin rash/ lesion and itching.  Eyes:   Patient denies blurred vision and double vision.  Ears/ Nose/ Throat:   Patient denies sore throat and sinus problems.  Hematologic/Lymphatic:   Patient denies swollen glands and easy bruising.  Cardiovascular:   Patient denies leg swelling and chest pains.  Respiratory:   Patient denies cough and shortness of breath.  Endocrine:   Patient denies excessive thirst.  Musculoskeletal:   Patient reports back pain. Patient denies joint pain.  Neurological:   Patient denies headaches and dizziness.  Psychologic:   Patient denies depression and anxiety.   VITAL SIGNS:      03/22/2019 03:23 PM  Weight 210 lb / 95.25 kg  Height 69 in / 175.26 cm  BP 113/79 mmHg  Pulse 88 /min  Temperature 98.2 F / 36.7 C  BMI 31.0 kg/m   MULTI-SYSTEM PHYSICAL EXAMINATION:    Constitutional: Well-nourished. No physical deformities. Normally developed. Good grooming.  Respiratory: Normal breath sounds. No labored breathing, no use of accessory muscles.   Cardiovascular: Regular rate and rhythm. No murmur, no gallop.      PAST DATA REVIEWED:  Source Of History:  Patient  Lab Test Review:   BMP, CBC with Diff  Urine Test Review:   Urinalysis  X-Ray Review: KUB: Reviewed Films. Discussed With Patient.    Notes:                     Cr was 1.04 and CO2 was 19 on 03/03/19 CBC was normal.   PROCEDURES:         KUB - 74018  A single view of the abdomen is obtained. She has interval enlargement of her right renal stones with a 7.5mm renal pelvic stone and several calyceal stones. She has small left renal calyceal stones that are minimally changed. There are clips in the right pelvis. There are no bone, gas or soft tissue abnormalities.       Patient confirmed No Neulasta  OnPro Device.            Urinalysis w/Scope Dipstick Dipstick Cont'd Micro  Color: Yellow Bilirubin: Neg mg/dL WBC/hpf: 0 - 5/hpf  Appearance: Clear Ketones: Neg mg/dL RBC/hpf: 0 - 2/hpf  Specific Gravity: 1.015 Blood: Neg ery/uL Bacteria: NS (Not Seen)  pH: 6.5 Protein: Neg mg/dL Cystals: NS (Not Seen)  Glucose: Neg mg/dL Urobilinogen: 0.2 mg/dL Casts: NS (Not Seen)    Nitrites: Neg Trichomonas: Not Present    Leukocyte Esterase: Trace leu/uL Mucous: Present      Epithelial Cells: 0 - 5/hpf      Yeast: NS (Not Seen)      Sperm: Not Present    ASSESSMENT:      ICD-10 Details  1 GU:   Renal calculus - N20.0 Worsening - She has   metabolically active stone disease with an enlarging right renal pelvic stone. I discussed options for therapy including ESWL and URS and after reviewing the pros and cons she would like to proceed with ESWL. I reviewed the risks of ESWL including bleeding, infection, injury to the kidney or adjacent structures, failure to fragment the stone, need for ancillary procedures, thrombotic events, cardiac arrhythmias and sedation complications.   2 NON-GU:   Nausea - R11.0 Worsening - I have refilled the phenergan.    PLAN:            Medications New Meds: Promethazine Hcl 25 mg tablet 1 tablet PO Q 6 H PRN   #30  1 Refill(s)            Schedule Return Visit/Planned Activity: Next Available Appointment - Schedule Surgery   After a thorough review of the management options for the patient's condition the patient  elected to proceed with surgical therapy as noted above. We have discussed the potential benefits and risks of the procedure, side effects of the proposed treatment, the likelihood of the patient achieving the goals of the procedure, and any potential problems that might occur during the procedure or recuperation. Informed consent has been obtained.

## 2019-04-20 NOTE — H&P (View-Only) (Signed)
I have kidney stones.  HPI: [name] is a 43 year-old female established patient who is here for renal calculi.  The problem is on the right side. She is currently having back pain and nausea. She denies having flank pain, groin pain, vomiting, fever, and chills.   She has had eswl for treatment of her stones in the past.   03/22/19: [name] returns today in f/u for her history of stones. She has back pain from a spinal issue and has had some recent nausea. She has had no hematuria and had recent labs on 5/20 with a Cr of 1.04. The KUB today shows bilateral renal stones with interval growth and the right renal pelvic stone is now about 7.835mm. She had bilateral stones treated with staged URS last May and June. She is currently getting chiropractic care for her back.     ALLERGIES: Cardizem Dilaudid - " respiratory distress" tapes    MEDICATIONS: Prilosec  Aldactazide 25 mg-25 mg tablet 1/2 tablet PO Daily  Benadryl  Cellcept  Glucosamine  Loratadine 10 mg capsule  Methionine 500 Mg Pill  Methocarbamol 500 mg tablet  Multiple Vitamin  Neurontin  Oxycodone-Acetaminophen 5 mg-325 mg tablet  Potassium Chloride  Promethazine Hcl  Toprol Xl 25 mg tablet, extended release 24 hr  Tramadol Hcl  Trazodone Hcl  Tumeric  Vitamin B12  Vitamin D2  Zofran     GU PSH: Cystoscopy Insert Stent, Bilateral - 03/03/2018 Ureteroscopic laser litho, Bilateral - 03/17/2018      PSH Notes: C-section X3. Partial hysterectomy in 2014. Exp. Lap in 2016. Renal stents on 03/03/2018. ESWL on 03/17/2018.  steroid injections in spine     NON-GU PSH: None   GU PMH: Dysuria - 07/29/2018 Renal calculus - 07/29/2018, She has bilateral renal stones on KUB and mild left hydro on US. She could be passing a small stone with associated nausea. I am going to give her tamsulosin and reviewed the side effects. She will return in 2 weeks and if the symptoms persist, we will need to get a CT. , - 07/15/2018, She did have  residual renal stones on Renal US at her last visit. , - 07/09/2018, [name] has residual renal stones and had Calcium Phosphate as her primary stone. I am going to get a hypercalcuria profile and 2 24 hr urines for stone risk and will have her return in 3 months with a KUB. I have given her information on stone prevention. , - 05/21/2018, - 04/09/2018, - 03/25/2018 History of urolithiasis, She has distal tubuler acidosis from her Sjogren's syndrome with associated hypocitraturia. She has excellent urine volume and she has been given recommendations from her nephrologist to try to mitigate the acidosis with a change to aldactazide 25-25 1/2 tab daily and the use of L-methionine. I have encouraged her to maintain her high urine output and I will have her f/u with a repeat 24 hr urine collection and KUB in 6 months. UA today has microhematuria. - 07/09/2018 Hypocitraturia, She is not a candidate for potassium citrate because of the higher pH and presence of calcium apatite stones. - 07/09/2018 Other disorders resulting from impaired renal tubular function - 07/09/2018    NON-GU PMH: Nausea - 07/15/2018 Anxiety Arrhythmia GERD Sicca syndrome, unspecified    FAMILY HISTORY: Breast Cancer - Mother   SOCIAL HISTORY: Marital Status: Married Preferred Language: English; Ethnicity: Not Hispanic Or Latino; Race: White Current Smoking Status: Patient does not smoke anymore. Smoked for 10 years.   Tobacco  Use Assessment Completed: Used Tobacco in last 30 days? Social Drinker.  Drinks 1 caffeinated drink per day. 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    REVIEW OF SYSTEMS:    GU Review Female:   Patient denies frequent urination, hard to postpone urination, burning /pain with urination, get up at night to urinate, leakage of urine, stream starts and stops, trouble starting your stream, have to strain to urinate, and being pregnant.  Gastrointestinal (Upper):   Patient denies nausea, vomiting, and  indigestion/ heartburn.  Gastrointestinal (Lower):   Patient denies diarrhea and constipation.  Constitutional:   Patient denies fever, night sweats, weight loss, and fatigue.  Skin:   Patient denies skin rash/ lesion and itching.  Eyes:   Patient denies blurred vision and double vision.  Ears/ Nose/ Throat:   Patient denies sore throat and sinus problems.  Hematologic/Lymphatic:   Patient denies swollen glands and easy bruising.  Cardiovascular:   Patient denies leg swelling and chest pains.  Respiratory:   Patient denies cough and shortness of breath.  Endocrine:   Patient denies excessive thirst.  Musculoskeletal:   Patient reports back pain. Patient denies joint pain.  Neurological:   Patient denies headaches and dizziness.  Psychologic:   Patient denies depression and anxiety.   VITAL SIGNS:      03/22/2019 03:23 PM  Weight 210 lb / 95.25 kg  Height 69 in / 175.26 cm  BP 113/79 mmHg  Pulse 88 /min  Temperature 98.2 F / 36.7 C  BMI 31.0 kg/m   MULTI-SYSTEM PHYSICAL EXAMINATION:    Constitutional: Well-nourished. No physical deformities. Normally developed. Good grooming.  Respiratory: Normal breath sounds. No labored breathing, no use of accessory muscles.   Cardiovascular: Regular rate and rhythm. No murmur, no gallop.      PAST DATA REVIEWED:  Source Of History:  Patient  Lab Test Review:   BMP, CBC with Diff  Urine Test Review:   Urinalysis  X-Ray Review: KUB: Reviewed Films. Discussed With Patient.    Notes:                     Cr was 1.04 and CO2 was 19 on 03/03/19 CBC was normal.   PROCEDURES:         KUB - 74018  A single view of the abdomen is obtained. She has interval enlargement of her right renal stones with a 7.655mm renal pelvic stone and several calyceal stones. She has small left renal calyceal stones that are minimally changed. There are clips in the right pelvis. There are no bone, gas or soft tissue abnormalities.       Patient confirmed No Neulasta  OnPro Device.            Urinalysis w/Scope Dipstick Dipstick Cont'd Micro  Color: Yellow Bilirubin: Neg mg/dL WBC/hpf: 0 - 5/hpf  Appearance: Clear Ketones: Neg mg/dL RBC/hpf: 0 - 2/hpf  Specific Gravity: 1.015 Blood: Neg ery/uL Bacteria: NS (Not Seen)  pH: 6.5 Protein: Neg mg/dL Cystals: NS (Not Seen)  Glucose: Neg mg/dL Urobilinogen: 0.2 mg/dL Casts: NS (Not Seen)    Nitrites: Neg Trichomonas: Not Present    Leukocyte Esterase: Trace leu/uL Mucous: Present      Epithelial Cells: 0 - 5/hpf      Yeast: NS (Not Seen)      Sperm: Not Present    ASSESSMENT:      ICD-10 Details  1 GU:   Renal calculus - N20.0 Worsening - She has  metabolically active stone disease with an enlarging right renal pelvic stone. I discussed options for therapy including ESWL and URS and after reviewing the pros and cons she would like to proceed with ESWL. I reviewed the risks of ESWL including bleeding, infection, injury to the kidney or adjacent structures, failure to fragment the stone, need for ancillary procedures, thrombotic events, cardiac arrhythmias and sedation complications.   2 NON-GU:   Nausea - R11.0 Worsening - I have refilled the phenergan.    PLAN:            Medications New Meds: Promethazine Hcl 25 mg tablet 1 tablet PO Q 6 H PRN   #30  1 Refill(s)            Schedule Return Visit/Planned Activity: Next Available Appointment - Schedule Surgery   After a thorough review of the management options for the patient's condition the patient  elected to proceed with surgical therapy as noted above. We have discussed the potential benefits and risks of the procedure, side effects of the proposed treatment, the likelihood of the patient achieving the goals of the procedure, and any potential problems that might occur during the procedure or recuperation. Informed consent has been obtained.

## 2019-04-22 ENCOUNTER — Encounter (HOSPITAL_COMMUNITY)
Admission: RE | Disposition: A | Discharge status: Home / Self Care | Payer: Self-pay | Source: Other Acute Inpatient Hospital | Attending: Urology

## 2019-04-22 ENCOUNTER — Ambulatory Visit (HOSPITAL_COMMUNITY): Payer: Commercial Managed Care - PPO

## 2019-04-22 ENCOUNTER — Encounter (HOSPITAL_COMMUNITY): Payer: Self-pay | Admitting: General Practice

## 2019-04-22 ENCOUNTER — Ambulatory Visit (HOSPITAL_COMMUNITY)
Admission: RE | Admit: 2019-04-22 | Discharge: 2019-04-22 | Disposition: A | Discharge status: Home / Self Care | Payer: Commercial Managed Care - PPO | Source: Other Acute Inpatient Hospital | Attending: Urology | Admitting: Urology

## 2019-04-22 DIAGNOSIS — F419 Anxiety disorder, unspecified: Secondary | ICD-10-CM | POA: Diagnosis not present

## 2019-04-22 DIAGNOSIS — Z87891 Personal history of nicotine dependence: Secondary | ICD-10-CM | POA: Insufficient documentation

## 2019-04-22 DIAGNOSIS — K219 Gastro-esophageal reflux disease without esophagitis: Secondary | ICD-10-CM | POA: Diagnosis not present

## 2019-04-22 DIAGNOSIS — N200 Calculus of kidney: Secondary | ICD-10-CM | POA: Diagnosis present

## 2019-04-22 DIAGNOSIS — Z79899 Other long term (current) drug therapy: Secondary | ICD-10-CM | POA: Insufficient documentation

## 2019-04-22 HISTORY — DX: Personal history of urinary calculi: Z87.442

## 2019-04-22 HISTORY — PX: EXTRACORPOREAL SHOCK WAVE LITHOTRIPSY: SHX1557

## 2019-04-22 SURGERY — LITHOTRIPSY, ESWL
Anesthesia: LOCAL | Laterality: Right

## 2019-04-22 MED ORDER — DIPHENHYDRAMINE HCL 25 MG PO CAPS
25.0000 mg | ORAL_CAPSULE | ORAL | Status: AC
  Administered 2019-04-22: 25 mg via ORAL
  Filled 2019-04-22: qty 1

## 2019-04-22 MED ORDER — CIPROFLOXACIN HCL 500 MG PO TABS
500.0000 mg | ORAL_TABLET | ORAL | Status: AC
  Administered 2019-04-22: 500 mg via ORAL
  Filled 2019-04-22: qty 1

## 2019-04-22 MED ORDER — DIAZEPAM 5 MG PO TABS
10.0000 mg | ORAL_TABLET | ORAL | Status: AC
  Administered 2019-04-22: 10 mg via ORAL
  Filled 2019-04-22: qty 2

## 2019-04-22 MED ORDER — SODIUM CHLORIDE 0.9 % IV SOLN
INTRAVENOUS | Status: DC
  Administered 2019-04-22: 10:00:00 via INTRAVENOUS

## 2019-04-22 NOTE — Progress Notes (Signed)
Procedure cancelled today, Reported to me "while on the lithotripsy truck, patient requested the medications to be given a certain way, lithotripsy staff unable to accommodate patient's request due to protocols". Procedure cancelled per Dr. Matilde Sprang , patient to follow-up with the office.

## 2019-04-22 NOTE — Discharge Instructions (Signed)
No surgery done

## 2019-04-22 NOTE — Interval H&P Note (Signed)
History and Physical Interval Note:  04/22/2019 10:03 AM  [name]  has presented today for surgery, with the diagnosis of RIGHT RENAL STONE.  The various methods of treatment have been discussed with the patient and family. After consideration of risks, benefits and other options for treatment, the patient has consented to  Procedure(s): EXTRACORPOREAL SHOCK WAVE LITHOTRIPSY (ESWL) (Right) as a surgical intervention.  The patient's history has been reviewed, patient examined, no change in status, stable for surgery.  I have reviewed the patient's chart and labs.  Questions were answered to the patient's satisfaction.     [name] A Camaya [employer]

## 2019-04-23 ENCOUNTER — Encounter (HOSPITAL_COMMUNITY): Payer: Self-pay | Admitting: Urology

## 2019-04-26 ENCOUNTER — Other Ambulatory Visit: Payer: Self-pay | Admitting: Urology

## 2019-04-26 ENCOUNTER — Encounter (HOSPITAL_COMMUNITY): Payer: Self-pay | Admitting: *Deleted

## 2019-04-26 NOTE — Progress Notes (Addendum)
Spoke to patient via phone,history obtained,updated.  Bring blue folder,insurance cards,picture ID,designated driver and living will,POA, if desires (to be placed on chart). Reinforced no aspirin(instructions to hold aspirin per your doctor), ibuprofen products 72 hours prior to procedure. No vitamins or herbal medicines 7 days prior to procedure.   Follow laxative instructions provided by urologist (office) and in blue folder. Wear easy on/off clothing and no jewelry except wedding rings and ear rings. Leave all other valuables at home. Verbalizes understanding of instructions . Family is allowed on campus; however, not in [place] area. You will need to get covid tested on Friday 04/30/19 at [place] [address] through testing. NPO after midnight. You must wear a mask when on campus. Patient verbalizes understanding.

## 2019-04-27 NOTE — Progress Notes (Signed)
Late Entry for 04/23/19  Notified [facility] that family members are not allowed in short stay at this time. They said they would notify Dr Jeffie Pollock.

## 2019-04-27 NOTE — Progress Notes (Signed)
Late Entry for 04/22/2019 at 0930   While patient was in Short Stay, patient stated" I want the best person in the department to stick me, you only get one shot", RN got the flow coordinator to start patient's IV.While Flow Coordinator was in the room, patient was instructed that she would have to lay on the stretcher, Patient stated" I have bulging discs in my back", bed re-adjusted for patient's comfort. Patient was telling the nurse where she could stick for an IV, instructed patient that they prefer the IV not to be in antecubital, no response from the patient. Patient was only stuck one time for an IV.   Patient was instructed on why, it was important for the IV to be on the opposite side of the side being treated, Patient just looked at RN, no response was given to RN. Instructed patient that they would have pillows to help with her discs on the litho truck, no response from patient. The bottom of patient's stretcher was elevated to accommodate patient. When RN picked patient up from the lithotripsy truck , Patient stated to Dr. Matilde Sprang" I want to hear what you say to my husband"  Patient had to be beside Dr. Matilde Sprang while he called her husband. Patient was not crying when RN picked her up from Lithotripsy Truck. Patient taken to PACU for Recovery.

## 2019-04-27 NOTE — Progress Notes (Signed)
Discussed with Dr Jeffie Pollock the policy regarding family in hospital/department. Family is not allowed in our department.  We also talked about the patients behavior while in short stay Thursday. Patient was difficult,  uncooperative, manipulative, rude with all involved in her care. She was reportedly the same during the pre- litho call.   She required extra time for explanations which were to explain why we have to do things in this setting.  Even with our efforts to comfort and educate-- every attempt was met with resistance as she continued trying to dictate her care.   She is attempting to blame her behavior on valium which was given after the behaviors above were demonstrated and at the end of getting her ready.  I shared all of this with Dr Jeffie Pollock.

## 2019-04-30 ENCOUNTER — Other Ambulatory Visit (HOSPITAL_COMMUNITY)
Admission: RE | Admit: 2019-04-30 | Discharge: 2019-04-30 | Disposition: A | Discharge status: Home / Self Care | Payer: Commercial Managed Care - PPO | Source: Ambulatory Visit | Attending: Urology | Admitting: Urology

## 2019-04-30 DIAGNOSIS — Z1159 Encounter for screening for other viral diseases: Secondary | ICD-10-CM | POA: Diagnosis not present

## 2019-04-30 LAB — SARS CORONAVIRUS 2 (TAT 6-24 HRS): SARS Coronavirus 2: NEGATIVE

## 2019-05-03 ENCOUNTER — Ambulatory Visit (HOSPITAL_COMMUNITY): Payer: Commercial Managed Care - PPO | Admitting: Anesthesiology

## 2019-05-03 ENCOUNTER — Encounter (HOSPITAL_COMMUNITY): Payer: Self-pay

## 2019-05-03 ENCOUNTER — Ambulatory Visit (HOSPITAL_COMMUNITY): Payer: Commercial Managed Care - PPO

## 2019-05-03 ENCOUNTER — Other Ambulatory Visit: Payer: Self-pay

## 2019-05-03 ENCOUNTER — Ambulatory Visit (HOSPITAL_COMMUNITY)
Admission: RE | Admit: 2019-05-03 | Discharge: 2019-05-03 | Disposition: A | Discharge status: Home / Self Care | Payer: Commercial Managed Care - PPO | Attending: Urology | Admitting: Urology

## 2019-05-03 ENCOUNTER — Encounter (HOSPITAL_COMMUNITY)
Admission: RE | Disposition: A | Discharge status: Home / Self Care | Payer: Self-pay | Source: Home / Self Care | Attending: Urology

## 2019-05-03 DIAGNOSIS — Z79891 Long term (current) use of opiate analgesic: Secondary | ICD-10-CM | POA: Insufficient documentation

## 2019-05-03 DIAGNOSIS — M3500 Sicca syndrome, unspecified: Secondary | ICD-10-CM | POA: Diagnosis not present

## 2019-05-03 DIAGNOSIS — K219 Gastro-esophageal reflux disease without esophagitis: Secondary | ICD-10-CM | POA: Insufficient documentation

## 2019-05-03 DIAGNOSIS — Z888 Allergy status to other drugs, medicaments and biological substances status: Secondary | ICD-10-CM | POA: Insufficient documentation

## 2019-05-03 DIAGNOSIS — N200 Calculus of kidney: Secondary | ICD-10-CM | POA: Diagnosis not present

## 2019-05-03 DIAGNOSIS — Z79899 Other long term (current) drug therapy: Secondary | ICD-10-CM | POA: Insufficient documentation

## 2019-05-03 DIAGNOSIS — Z6831 Body mass index (BMI) 31.0-31.9, adult: Secondary | ICD-10-CM | POA: Diagnosis not present

## 2019-05-03 DIAGNOSIS — Z885 Allergy status to narcotic agent status: Secondary | ICD-10-CM | POA: Diagnosis not present

## 2019-05-03 DIAGNOSIS — Z87891 Personal history of nicotine dependence: Secondary | ICD-10-CM | POA: Insufficient documentation

## 2019-05-03 DIAGNOSIS — E669 Obesity, unspecified: Secondary | ICD-10-CM | POA: Insufficient documentation

## 2019-05-03 DIAGNOSIS — N2589 Other disorders resulting from impaired renal tubular function: Secondary | ICD-10-CM | POA: Insufficient documentation

## 2019-05-03 HISTORY — PX: EXTRACORPOREAL SHOCK WAVE LITHOTRIPSY: SHX1557

## 2019-05-03 SURGERY — LITHOTRIPSY, ESWL
Anesthesia: Monitor Anesthesia Care | Laterality: Right

## 2019-05-03 MED ORDER — OXYCODONE HCL 5 MG PO TABS
ORAL_TABLET | ORAL | Status: AC
  Administered 2019-05-03: 5 mg via ORAL
  Filled 2019-05-03: qty 1

## 2019-05-03 MED ORDER — ACETAMINOPHEN 325 MG PO TABS
ORAL_TABLET | ORAL | Status: AC
  Administered 2019-05-03: 325 mg via ORAL
  Filled 2019-05-03: qty 1

## 2019-05-03 MED ORDER — LIDOCAINE 2% (20 MG/ML) 5 ML SYRINGE
INTRAMUSCULAR | Status: AC
  Filled 2019-05-03: qty 5

## 2019-05-03 MED ORDER — OXYCODONE HCL 5 MG/5ML PO SOLN: 5.0000 mg | Freq: Once | ORAL | Status: DC | PRN

## 2019-05-03 MED ORDER — FENTANYL CITRATE (PF) 100 MCG/2ML IJ SOLN
INTRAMUSCULAR | Status: AC
  Filled 2019-05-03: qty 2

## 2019-05-03 MED ORDER — PROPOFOL 10 MG/ML IV BOLUS
INTRAVENOUS | Status: AC
  Filled 2019-05-03: qty 120

## 2019-05-03 MED ORDER — PROMETHAZINE HCL 25 MG/ML IJ SOLN: 6.2500 mg | INTRAMUSCULAR | Status: DC | PRN

## 2019-05-03 MED ORDER — GLYCOPYRROLATE PF 0.2 MG/ML IJ SOSY
PREFILLED_SYRINGE | INTRAMUSCULAR | Status: AC
  Filled 2019-05-03: qty 1

## 2019-05-03 MED ORDER — SODIUM CHLORIDE 0.9% FLUSH: 3.0000 mL | INTRAVENOUS | Status: DC | PRN

## 2019-05-03 MED ORDER — ONDANSETRON HCL 4 MG/2ML IJ SOLN
INTRAMUSCULAR | Status: AC
  Filled 2019-05-03: qty 2

## 2019-05-03 MED ORDER — MIDAZOLAM HCL 2 MG/2ML IJ SOLN
INTRAMUSCULAR | Status: AC
  Filled 2019-05-03: qty 2

## 2019-05-03 MED ORDER — PHENYLEPHRINE 40 MCG/ML (10ML) SYRINGE FOR IV PUSH (FOR BLOOD PRESSURE SUPPORT)
PREFILLED_SYRINGE | INTRAVENOUS | Status: AC
  Filled 2019-05-03: qty 10

## 2019-05-03 MED ORDER — EPHEDRINE 5 MG/ML INJ
INTRAVENOUS | Status: AC
  Filled 2019-05-03: qty 10

## 2019-05-03 MED ORDER — PROPOFOL 500 MG/50ML IV EMUL
INTRAVENOUS | Status: DC | PRN
  Administered 2019-05-03: 75 ug/kg/min via INTRAVENOUS

## 2019-05-03 MED ORDER — FENTANYL CITRATE (PF) 100 MCG/2ML IJ SOLN: 25.0000 ug | INTRAMUSCULAR | Status: DC | PRN

## 2019-05-03 MED ORDER — FENTANYL CITRATE (PF) 100 MCG/2ML IJ SOLN
INTRAMUSCULAR | Status: DC | PRN
  Administered 2019-05-03 (×2): 50 ug via INTRAVENOUS

## 2019-05-03 MED ORDER — ACETAMINOPHEN 325 MG PO TABS
650.0000 mg | ORAL_TABLET | ORAL | Status: DC | PRN
  Administered 2019-05-03: 09:00:00 325 mg via ORAL

## 2019-05-03 MED ORDER — SODIUM CHLORIDE 0.9% FLUSH: 3.0000 mL | Freq: Two times a day (BID) | INTRAVENOUS | Status: DC

## 2019-05-03 MED ORDER — DIAZEPAM 5 MG PO TABS: 10.0000 mg | ORAL_TABLET | ORAL | Status: DC

## 2019-05-03 MED ORDER — ACETAMINOPHEN 650 MG RE SUPP
650.0000 mg | RECTAL | Status: DC | PRN
  Filled 2019-05-03: qty 1

## 2019-05-03 MED ORDER — OXYCODONE HCL 5 MG PO TABS
5.0000 mg | ORAL_TABLET | ORAL | Status: DC | PRN
  Administered 2019-05-03: 09:00:00 5 mg via ORAL

## 2019-05-03 MED ORDER — OXYCODONE HCL 5 MG PO TABS: 5.0000 mg | ORAL_TABLET | Freq: Once | ORAL | Status: DC | PRN

## 2019-05-03 MED ORDER — CIPROFLOXACIN HCL 500 MG PO TABS
500.0000 mg | ORAL_TABLET | ORAL | Status: AC
  Administered 2019-05-03: 07:00:00 500 mg via ORAL
  Filled 2019-05-03: qty 1

## 2019-05-03 MED ORDER — SODIUM CHLORIDE 0.9 % IV SOLN: 250.0000 mL | INTRAVENOUS | Status: DC | PRN

## 2019-05-03 MED ORDER — DEXAMETHASONE SODIUM PHOSPHATE 10 MG/ML IJ SOLN
INTRAMUSCULAR | Status: AC
  Filled 2019-05-03: qty 1

## 2019-05-03 MED ORDER — SODIUM CHLORIDE 0.9 % IV SOLN
INTRAVENOUS | Status: DC
  Administered 2019-05-03: 07:00:00 via INTRAVENOUS

## 2019-05-03 MED ORDER — ONDANSETRON HCL 4 MG/2ML IJ SOLN
INTRAMUSCULAR | Status: DC | PRN
  Administered 2019-05-03: 4 mg via INTRAVENOUS

## 2019-05-03 MED ORDER — DIPHENHYDRAMINE HCL 25 MG PO CAPS: 25.0000 mg | ORAL_CAPSULE | ORAL | Status: DC

## 2019-05-03 MED ORDER — MIDAZOLAM HCL 5 MG/5ML IJ SOLN
INTRAMUSCULAR | Status: DC | PRN
  Administered 2019-05-03: 2 mg via INTRAVENOUS

## 2019-05-03 NOTE — Addendum Note (Signed)
Addendum  created 05/03/19 0946 by Glory Buff, CRNA   Charge Capture section accepted

## 2019-05-03 NOTE — Anesthesia Preprocedure Evaluation (Addendum)
Anesthesia Evaluation  Patient identified by MRN, date of birth, ID band Patient awake    Reviewed: Allergy & Precautions, NPO status , Patient's Chart, lab work & pertinent test results  Airway Mallampati: II  TM Distance: >3 FB Neck ROM: Full    Dental no notable dental hx.    Pulmonary neg pulmonary ROS, former smoker,    Pulmonary exam normal breath sounds clear to auscultation       Cardiovascular hypertension, Pt. on medications negative cardio ROS Normal cardiovascular exam Rhythm:Regular Rate:Normal     Neuro/Psych  Headaches, Anxiety Depression  Neuromuscular disease negative neurological ROS  negative psych ROS   GI/Hepatic negative GI ROS, Neg liver ROS, GERD  ,  Endo/Other  diabetes  Renal/GU Renal InsufficiencyRenal diseasenegative Renal ROS  negative genitourinary   Musculoskeletal negative musculoskeletal ROS (+)   Abdominal (+) + obese,   Peds negative pediatric ROS (+)  Hematology negative hematology ROS (+)   Anesthesia Other Findings   Reproductive/Obstetrics negative OB ROS                            Anesthesia Physical  Anesthesia Plan  ASA: II  Anesthesia Plan: MAC   Post-op Pain Management:    Induction: Intravenous  PONV Risk Score and Plan: 2 and Treatment may vary due to age or medical condition, Midazolam and Ondansetron  Airway Management Planned: Simple Face Mask  Additional Equipment:   Intra-op Plan:   Post-operative Plan:   Informed Consent: I have reviewed the patients History and Physical, chart, labs and discussed the procedure including the risks, benefits and alternatives for the proposed anesthesia with the patient or authorized representative who has indicated his/her understanding and acceptance.     Dental advisory given  Plan Discussed with: Anesthesiologist and CRNA  Anesthesia Plan Comments:         Anesthesia Quick  Evaluation

## 2019-05-03 NOTE — Anesthesia Postprocedure Evaluation (Signed)
Anesthesia Post Note  Patient: [name]  Procedure(s) Performed: EXTRACORPOREAL SHOCK WAVE LITHOTRIPSY (ESWL) (Right )     Patient location during evaluation: PACU Anesthesia Type: MAC Level of consciousness: awake and alert Pain management: pain level controlled Vital Signs Assessment: post-procedure vital signs reviewed and stable Respiratory status: spontaneous breathing, nonlabored ventilation and respiratory function stable Cardiovascular status: stable and blood pressure returned to baseline Postop Assessment: no apparent nausea or vomiting Anesthetic complications: no    Last Vitals:  Vitals:   05/03/19 0900 05/03/19 0915  BP: 100/67 106/72  Pulse: 70 75  Resp: 13 12  Temp:    SpO2: 96% 100%    Last Pain:  Vitals:   05/03/19 0900  PainSc: [place] Aydian Dimmick

## 2019-05-03 NOTE — Transfer of Care (Signed)
Immediate Anesthesia Transfer of Care Note  Patient: [name]  Procedure(s) Performed: EXTRACORPOREAL SHOCK WAVE LITHOTRIPSY (ESWL) (Right )  Patient Location: PACU  Anesthesia Type:MAC  Level of Consciousness: awake, alert  and oriented  Airway & Oxygen Therapy: Patient Spontanous Breathing  Post-op Assessment: Report given to RN and Post -op Vital signs reviewed and stable  Post vital signs: Reviewed and stable  Last Vitals:  Vitals Value Taken Time  BP 116/79 05/03/19 0848  Temp 36.6 C 05/03/19 0848  Pulse 68 05/03/19 0858  Resp 16 05/03/19 0858  SpO2 94 % 05/03/19 0858  Vitals shown include unvalidated device data.  Last Pain:  Vitals:   05/03/19 0638  PainSc: 7       Patients Stated Pain Goal: 3 (15/52/08 0223)  Complications: No apparent anesthesia complications

## 2019-05-03 NOTE — Interval H&P Note (Signed)
History and Physical Interval Note: Stones unchanged.   She had a panic attack at the last attempt so we are using MAC for this attempt.   05/03/2019 7:36 AM  [name]  has presented today for surgery, with the diagnosis of RIGHT RENAL STONE.  The various methods of treatment have been discussed with the patient and family. After consideration of risks, benefits and other options for treatment, the patient has consented to  Procedure(s): EXTRACORPOREAL SHOCK WAVE LITHOTRIPSY (ESWL) (Right) as a surgical intervention.  The patient's history has been reviewed, patient examined, no change in status, stable for surgery.  I have reviewed the patient's chart and labs.  Questions were answered to the patient's satisfaction.     Irine Seal

## 2019-05-03 NOTE — Discharge Instructions (Signed)

## 2019-05-04 ENCOUNTER — Encounter (HOSPITAL_COMMUNITY): Payer: Self-pay | Admitting: Urology

## 2019-05-14 ENCOUNTER — Other Ambulatory Visit: Payer: Self-pay | Admitting: Family Medicine

## 2019-05-14 DIAGNOSIS — M5442 Lumbago with sciatica, left side: Secondary | ICD-10-CM

## 2019-05-14 DIAGNOSIS — R252 Cramp and spasm: Secondary | ICD-10-CM

## 2019-05-14 DIAGNOSIS — G8929 Other chronic pain: Secondary | ICD-10-CM

## 2019-05-14 MED ORDER — OXYCODONE-ACETAMINOPHEN 5-325 MG PO TABS: 1.0000 | ORAL_TABLET | Freq: Two times a day (BID) | ORAL | 0 refills | Status: DC | PRN

## 2019-05-14 NOTE — Telephone Encounter (Signed)
Pt requesting refill Oxycodone 5-325 mg. Last OV 02/19/2019, Last Rx was 04/19/2019, # 60, Refill 0.

## 2019-06-17 ENCOUNTER — Other Ambulatory Visit: Payer: Self-pay | Admitting: Family Medicine

## 2019-06-17 DIAGNOSIS — R252 Cramp and spasm: Secondary | ICD-10-CM

## 2019-06-17 DIAGNOSIS — G8929 Other chronic pain: Secondary | ICD-10-CM

## 2019-06-17 NOTE — Telephone Encounter (Signed)
Last OV 02/19/19 Last refill 05/14/19 #60/0 Next OV not scheduled  Forwarding to Dr. Juleen China

## 2019-06-18 MED ORDER — OXYCODONE-ACETAMINOPHEN 5-325 MG PO TABS: 1.0000 | ORAL_TABLET | Freq: Two times a day (BID) | ORAL | 0 refills | Status: DC | PRN

## 2019-06-23 ENCOUNTER — Other Ambulatory Visit: Payer: Self-pay | Admitting: Family Medicine

## 2019-07-05 ENCOUNTER — Ambulatory Visit: Payer: Self-pay

## 2019-07-05 NOTE — Telephone Encounter (Signed)
This encounter was created in error - please disregard.

## 2019-07-13 ENCOUNTER — Ambulatory Visit: Payer: Commercial Managed Care - PPO | Admitting: Family Medicine

## 2019-07-13 ENCOUNTER — Other Ambulatory Visit: Payer: Self-pay

## 2019-07-13 ENCOUNTER — Encounter: Payer: Self-pay | Admitting: Family Medicine

## 2019-07-13 VITALS — BP 118/62 | HR 109 | Temp 97.6°F | Ht 69.0 in | Wt 214.2 lb

## 2019-07-13 DIAGNOSIS — I959 Hypotension, unspecified: Secondary | ICD-10-CM | POA: Diagnosis not present

## 2019-07-13 DIAGNOSIS — R600 Localized edema: Secondary | ICD-10-CM

## 2019-07-13 DIAGNOSIS — G8929 Other chronic pain: Secondary | ICD-10-CM

## 2019-07-13 DIAGNOSIS — Z23 Encounter for immunization: Secondary | ICD-10-CM

## 2019-07-13 DIAGNOSIS — I951 Orthostatic hypotension: Secondary | ICD-10-CM | POA: Diagnosis not present

## 2019-07-13 DIAGNOSIS — N200 Calculus of kidney: Secondary | ICD-10-CM

## 2019-07-13 DIAGNOSIS — M5441 Lumbago with sciatica, right side: Secondary | ICD-10-CM

## 2019-07-13 DIAGNOSIS — R252 Cramp and spasm: Secondary | ICD-10-CM | POA: Diagnosis not present

## 2019-07-13 DIAGNOSIS — M5442 Lumbago with sciatica, left side: Secondary | ICD-10-CM

## 2019-07-13 DIAGNOSIS — F418 Other specified anxiety disorders: Secondary | ICD-10-CM

## 2019-07-13 MED ORDER — OXYCODONE-ACETAMINOPHEN 5-325 MG PO TABS: 1.0000 | ORAL_TABLET | Freq: Two times a day (BID) | ORAL | 0 refills | Status: DC | PRN

## 2019-07-13 MED ORDER — LORAZEPAM 1 MG PO TABS: ORAL_TABLET | ORAL | 3 refills | Status: DC

## 2019-07-13 MED ORDER — SPIRONOLACTONE 25 MG PO TABS: 12.5000 mg | ORAL_TABLET | Freq: Every morning | ORAL | 1 refills | Status: DC

## 2019-07-13 NOTE — Progress Notes (Signed)
[name] is a 43 y.o. female is here for follow up.  History of Present Illness:   [name], CMA acting as scribe for Dr. Helane RimaErica Allycia Pitz.   HPI: Patient has had consisently low blood pressure readings over the last month. She has stopped her Toprol and increased fluids. She has had readings of high being 108/72 with HR 92 and low of 94/61 with heart rate of 72. She has noticed that her higher readings are in the afternoon. She has increased fluids and tried changing positions with no improvement of readings. She dose have some dizziness with low readings but has history of orthostatics.   Increased anxiety and depression. She  has had increased health issues as well as home schooling three kids. She has not been able to work due to [employer]ov[employer] and health issues. She has only left home to go to Doctors appointments.  There are no preventive care reminders to display for this patient. Depression screen PHQ 2/9 07/12/2018  Decreased Interest 0  Down, Depressed, Hopeless 0  PHQ - 2 Score 0   PMHx, SurgHx, SocialHx, FamHx, Medications, and Allergies were reviewed in the Visit Navigator and updated as appropriate.   Patient Active Problem List   Diagnosis Date Noted  . Chronic bilateral low back pain with bilateral sciatica 12/23/2018  . Renal calculus 07/17/2018  . Seasonal allergies 07/12/2018  . Vitamin D deficiency 07/12/2018  . Primary insomnia 07/12/2018  . GAD (generalized anxiety disorder) 07/12/2018  . Constipation 07/08/2018  . Sjogren's syndrome (HCC) 07/08/2018  . Chronic hypokalemia 07/08/2018  . Orthostatic hypotension 07/08/2018  . GERD (gastroesophageal reflux disease)   . Ureteral stone with hydronephrosis 03/03/2018   Social History   Tobacco Use  . Smoking status: Former Smoker    Years: 10.00    Types: Cigarettes    Quit date: 03/17/2007    Years since quitting: 12.3  . Smokeless tobacco: Never Used  Substance Use Topics  . Alcohol use: Not Currently   Frequency: Never  . Drug use: Never   Current Medications and Allergies   .  B Complex-Biotin-FA (B COMPLETE PO), Take by mouth., Disp: , Rfl:  .  Cholecalciferol (VITAMIN D3) 5000 units CAPS, Take 5,000 Units by mouth daily., Disp: , Rfl:  .  diphenhydrAMINE (BENADRYL) 50 MG capsule, Take 50 mg by mouth at bedtime as needed for sleep., Disp: , Rfl:  .  gabapentin (NEURONTIN) 300 MG capsule, TAKE 1 CAPSULE BY MOUTH THREE TIMES A DAY, Disp: 270 capsule, Rfl: 2 .  Glucosamine HCl 1500 MG TABS, Take 1,500 mg by mouth daily., Disp: , Rfl:  .  KLOR-CON M20 20 MEQ tablet, TAKE 1 TABLET BY MOUTH TWICE A DAY, Disp: 180 tablet, Rfl: 1 .  lidocaine (LIDODERM) 5 %, Place 1 patch onto the skin daily. Remove & Discard patch within 12 hours or as directed by MD.       Placed this morning., Disp: , Rfl:  .  loratadine (CLARITIN) 10 MG tablet, Take 10 mg by mouth every evening. , Disp: , Rfl:  .  methocarbamol (ROBAXIN) 500 MG tablet, Take 1 tablet (500 mg total) by mouth 4 (four) times daily., Disp: 20 tablet, Rfl: 0 .  metoprolol succinate (TOPROL-XL) 25 MG 24 hr tablet, Take 25 mg by mouth every morning. , Disp: , Rfl:  .  mycophenolate (CELLCEPT) 250 MG capsule, Take 250 mg by mouth 2 (two) times daily. Take with Cellcept 500 mg to equal 750 mg, Disp: ,  Rfl:  .  mycophenolate (CELLCEPT) 500 MG tablet, Take 500 mg by mouth 2 (two) times daily. Take with Cellcept 250 mg to equal 750 mg, Disp: , Rfl:  .  omeprazole (PRILOSEC) 20 MG capsule, Take 20 mg by mouth every morning. , Disp: , Rfl:  .  ondansetron (ZOFRAN) 8 MG tablet, Take 1 tablet (8 mg total) by mouth every 8 (eight) hours as needed for nausea or vomiting., Disp: 20 tablet, Rfl: 0 .  oxyCODONE-acetaminophen (PERCOCET/ROXICET) 5-325 MG tablet, Take 1-2 tablets by mouth 2 (two) times daily as needed for severe pain., Disp: 60 tablet, Rfl: 0 .  Polyethylene Glycol 3350 (MIRALAX PO), Take by mouth daily as needed., Disp: , Rfl:  .  promethazine  (PHENERGAN) 25 MG tablet, Take 12.5 mg by mouth every 6 (six) hours as needed for nausea or vomiting. Took this am, Disp: , Rfl:  .  spironolactone (ALDACTONE) 25 MG tablet, Take 12.5 mg by mouth every morning. , Disp: , Rfl:  .  traZODone (DESYREL) 100 MG tablet, TAKE 1 TABLET (100 MG TOTAL) BY MOUTH AT BEDTIME AS NEEDED FOR SLEEP., Disp: 90 tablet, Rfl: 1 .  Turmeric 500 MG CAPS, Take 500 mg by mouth daily., Disp: , Rfl:    Allergies  Allergen Reactions  . Cardizem [Diltiazem Hcl] Shortness Of Breath    And chest pain  . Citalopram Other (See Comments)    Has reaction to ALL SSRI's per patient cause suicidal ideation  . Hydromorphone Hcl Shortness Of Breath  . Lexapro [Escitalopram Oxalate] Other (See Comments)    Has reaction to ALL SSRI's per patient cause suicidal ideation  . Paxil [Paroxetine Hcl] Other (See Comments)    Has reaction to ALL SSRI's per patient cause suicidal ideation  . Prozac [Fluoxetine] Other (See Comments)    Has reaction to ALL SSRI's per patient cause suicidal ideation  . Zoloft [Sertraline] Other (See Comments)    Has reaction to ALL SSRI's per patient cause suicidal ideation  . Valium [Diazepam]     Panic attack,   . Tape Rash    tegaderm and paper tape   Review of Systems   Pertinent items are noted in the HPI. Otherwise, a complete ROS is negative.  Vitals   Vitals:   07/13/19 1441  BP: 118/62  Pulse: (!) 109  Temp: 97.6 F (36.4 C)  TempSrc: Temporal  SpO2: 97%  Weight: 214 lb 3.2 oz (97.2 kg)  Height: 5\' 9"  (1.753 m)     Body mass index is 31.63 kg/m.  Physical Exam   Physical Exam Vitals signs and nursing note reviewed.  HENT:     Head: Normocephalic and atraumatic.  Eyes:     Pupils: Pupils are equal, round, and reactive to light.  Neck:     Musculoskeletal: Normal range of motion and neck supple.  Cardiovascular:     Rate and Rhythm: Normal rate and regular rhythm.     Heart sounds: Normal heart sounds.  Pulmonary:      Effort: Pulmonary effort is normal.  Abdominal:     Palpations: Abdomen is soft.  Skin:    General: Skin is warm.  Psychiatric:        Behavior: Behavior normal.    Assessment and Plan   [name] was seen today for hypotension.  Diagnoses and all orders for this visit:  Hypotension, unspecified hypotension type -     ECHOCARDIOGRAM COMPLETE; Future  Orthostatic hypotension -     CBC with Differential/Platelet -  Comprehensive metabolic panel -     Magnesium  Leg cramps Comments: Thought to be radiculopathy.   Orders: -     oxyCODONE-acetaminophen (PERCOCET/ROXICET) 5-325 MG tablet; Take 1-2 tablets by mouth 2 (two) times daily as needed for severe pain.  Chronic bilateral low back pain with bilateral sciatica -     oxyCODONE-acetaminophen (PERCOCET/ROXICET) 5-325 MG tablet; Take 1-2 tablets by mouth 2 (two) times daily as needed for severe pain.  Need for immunization against influenza -     Flu Vaccine QUAD 36+ mos IM  Renal calculus  Lower extremity edema -     spironolactone (ALDACTONE) 25 MG tablet; Take 0.5 tablets (12.5 mg total) by mouth every morning.  Situational anxiety -     LORazepam (ATIVAN) 1 MG tablet; Daily as needed    . Orders and follow up as documented in [place], reviewed diet, exercise and weight control, cardiovascular risk and specific lipid/LDL goals reviewed, reviewed medications and side effects in detail.  . Reviewed expectations re: course of current medical issues. . Outlined signs and symptoms indicating need for more acute intervention. . Patient verbalized understanding and all questions were answered. . Patient received an After Visit Summary.  CMA served as Education administrator during this visit. History, Physical, and Plan performed by medical provider. The above documentation has been reviewed and is accurate and complete. Briscoe Deutscher, D.O.  Briscoe Deutscher, DO [place], [place] [facility] 07/14/2019

## 2019-07-13 NOTE — Patient Instructions (Signed)
Stop HCTZ and Potassium  Refills sent in for Norco, and spironolactone.  Start Ativan  We will call when lab results are reviewed.  Order has been placed for Echo. If you do not get call about scheduling in about 2 days let us know.   Influenza (Flu) Vaccine (Inactivated or Recombinant): What You Need to Know 1. Why get vaccinated? Influenza vaccine can prevent influenza (flu). Flu is a contagious disease that spreads around the [place] every year, usually between October and May. Anyone can get the flu, but it is more dangerous for some people. Infants and young children, people 35 years of age and older, pregnant women, and people with certain health conditions or a weakened immune system are at greatest risk of flu complications. Pneumonia, bronchitis, sinus infections and ear infections are examples of flu-related complications. If you have a medical condition, such as heart disease, cancer or diabetes, flu can make it worse. Flu can cause fever and chills, sore throat, muscle aches, fatigue, cough, headache, and runny or stuffy nose. Some people may have vomiting and diarrhea, though this is more common in children than adults. Each year thousands of people in the [place] die from flu, and many more are hospitalized. Flu vaccine prevents millions of illnesses and flu-related visits to the doctor each year. 2. Influenza vaccine CDC recommends everyone 20 months of age and older get vaccinated every flu season. Children 6 months through 25 years of age may need 2 doses during a single flu season. Everyone else needs only 1 dose each flu season. It takes about 2 weeks for protection to develop after vaccination. There are many flu viruses, and they are always changing. Each year a new flu vaccine is made to protect against three or four viruses that are likely to cause disease in the upcoming flu season. Even when the vaccine doesn't exactly match these viruses, it may still provide some  protection. Influenza vaccine does not cause flu. Influenza vaccine may be given at the same time as other vaccines. 3. Talk with your health care provider Tell your vaccine provider if the person getting the vaccine:  Has had an allergic reaction after a previous dose of influenza vaccine, or has any severe, life-threatening allergies.  Has ever had Guillain-Barr Syndrome (also called GBS). In some cases, your health care provider may decide to postpone influenza vaccination to a future visit. People with minor illnesses, such as a cold, may be vaccinated. People who are moderately or severely ill should usually wait until they recover before getting influenza vaccine. Your health care provider can give you more information. 4. Risks of a vaccine reaction  Soreness, redness, and swelling where shot is given, fever, muscle aches, and headache can happen after influenza vaccine.  There may be a very small increased risk of Guillain-Barr Syndrome (GBS) after inactivated influenza vaccine (the flu shot). Young children who get the flu shot along with pneumococcal vaccine (PCV13), and/or DTaP vaccine at the same time might be slightly more likely to have a seizure caused by fever. Tell your health care provider if a child who is getting flu vaccine has ever had a seizure. People sometimes faint after medical procedures, including vaccination. Tell your provider if you feel dizzy or have vision changes or ringing in the ears. As with any medicine, there is a very remote chance of a vaccine causing a severe allergic reaction, other serious injury, or death. 5. What if there is a serious problem? An allergic  reaction could occur after the vaccinated person leaves the clinic. If you see signs of a severe allergic reaction (hives, swelling of the face and throat, difficulty breathing, a fast heartbeat, dizziness, or weakness), call 9-1-1 and get the person to the nearest hospital. For other signs that  concern you, call your health care provider. Adverse reactions should be reported to the Vaccine Adverse Event Reporting System (VAERS). Your health care provider will usually file this report, or you can do it yourself. Visit the VAERS website at www.vaers.LAgents.no or call 8638706845.VAERS is only for reporting reactions, and VAERS staff do not give medical advice. 6. The National Vaccine Injury Compensation Program The [employer] Vaccine Injury Compensation Program (VICP) is a federal program that was created to compensate people who may have been injured by certain vaccines. Visit the VICP website at SpiritualWord.at or call 254-347-8077 to learn about the program and about filing a claim. There is a time limit to file a claim for compensation. 7. How can I learn more?  Ask your healthcare provider.  Call your local or state health department.  Contact the Centers for Disease Control and Prevention (CDC): ? Call 613-096-8879 (1-800-CDC-INFO) or ? Visit CDC's BiotechRoom.com.cy Vaccine Information Statement (Interim) Inactivated Influenza Vaccine (05/28/2018) This information is not intended to replace advice given to you by your health care provider. Make sure you discuss any questions you have with your health care provider. Document Released: 07/25/2006 Document Revised: 01/19/2019 Document Reviewed: 06/01/2018 Elsevier Patient Education  2020 [employer].

## 2019-07-14 LAB — CBC WITH DIFFERENTIAL/PLATELET
Basophils Absolute: 0 10*3/uL (ref 0.0–0.1)
Basophils Relative: 0.3 % (ref 0.0–3.0)
Eosinophils Absolute: 0 10*3/uL (ref 0.0–0.7)
Eosinophils Relative: 0.4 % (ref 0.0–5.0)
HCT: 41 % (ref 36.0–46.0)
Hemoglobin: 13.9 g/dL (ref 12.0–15.0)
Lymphocytes Relative: 19.3 % (ref 12.0–46.0)
Lymphs Abs: 0.8 10*3/uL (ref 0.7–4.0)
MCHC: 33.9 g/dL (ref 30.0–36.0)
MCV: 92.5 fl (ref 78.0–100.0)
Monocytes Absolute: 0.4 10*3/uL (ref 0.1–1.0)
Monocytes Relative: 9.4 % (ref 3.0–12.0)
Neutro Abs: 2.9 10*3/uL (ref 1.4–7.7)
Neutrophils Relative %: 70.6 % (ref 43.0–77.0)
Platelets: 189 10*3/uL (ref 150.0–400.0)
RBC: 4.43 Mil/uL (ref 3.87–5.11)
RDW: 13 % (ref 11.5–15.5)
WBC: 4.1 10*3/uL (ref 4.0–10.5)

## 2019-07-14 LAB — COMPREHENSIVE METABOLIC PANEL
ALT: 13 U/L (ref 0–35)
AST: 15 U/L (ref 0–37)
Albumin: 4.4 g/dL (ref 3.5–5.2)
Alkaline Phosphatase: 56 U/L (ref 39–117)
BUN: 15 mg/dL (ref 6–23)
CO2: 23 mEq/L (ref 19–32)
Calcium: 10.1 mg/dL (ref 8.4–10.5)
Chloride: 104 mEq/L (ref 96–112)
Creatinine, Ser: 1.05 mg/dL (ref 0.40–1.20)
GFR: 57.41 mL/min — ABNORMAL LOW (ref >60.00)
Glucose, Bld: 96 mg/dL (ref 70–99)
Potassium: 3.7 mEq/L (ref 3.5–5.1)
Sodium: 135 mEq/L (ref 135–145)
Total Bilirubin: 0.4 mg/dL (ref 0.2–1.2)
Total Protein: 7.6 g/dL (ref 6.0–8.3)

## 2019-07-14 LAB — MAGNESIUM: Magnesium: 2 mg/dL (ref 1.5–2.5)

## 2019-07-17 ENCOUNTER — Other Ambulatory Visit: Payer: Self-pay | Admitting: Family Medicine

## 2019-07-19 ENCOUNTER — Other Ambulatory Visit (HOSPITAL_COMMUNITY): Payer: Commercial Managed Care - PPO

## 2019-07-20 NOTE — Telephone Encounter (Signed)
Last fill 01/21/19  #90/1 Last OV 07/13/19

## 2019-07-23 ENCOUNTER — Encounter (HOSPITAL_COMMUNITY): Payer: Self-pay | Admitting: Family Medicine

## 2019-07-29 ENCOUNTER — Telehealth (HOSPITAL_COMMUNITY): Payer: Self-pay

## 2019-07-29 NOTE — Telephone Encounter (Signed)
New message    Just an FYI. We have made several attempts to contact this patient including sending a letter to schedule or reschedule their echocardiogram. We will be removing the patient from the echo WQ.   10.15.20 @ 10:38am lm on home vm Fidelis Loth  10.9.20 mail reminder letter Irlanda Croghan  9.30.20 @ 3:13pm lm on home vm - Antinette Keough

## 2019-08-11 ENCOUNTER — Other Ambulatory Visit: Payer: Self-pay | Admitting: Family Medicine

## 2019-08-11 DIAGNOSIS — G8929 Other chronic pain: Secondary | ICD-10-CM

## 2019-08-11 DIAGNOSIS — M5442 Lumbago with sciatica, left side: Secondary | ICD-10-CM

## 2019-08-11 DIAGNOSIS — R252 Cramp and spasm: Secondary | ICD-10-CM

## 2019-08-16 ENCOUNTER — Telehealth: Payer: Self-pay | Admitting: Family Medicine

## 2019-08-16 NOTE — Telephone Encounter (Signed)
Patient has TOC with you in January ok to refill?

## 2019-08-16 NOTE — Telephone Encounter (Signed)
JoEllen sent a message to [place] stating: Please advise not sure who to send to get refill or what I need to tell patient.   Lea please advise.

## 2019-08-16 NOTE — Telephone Encounter (Signed)
Got a refill request for oxycodone. I do not prescribe this drug. Will need to have filled by another provider.   Orma Flaming, MD [place]

## 2019-08-16 NOTE — Telephone Encounter (Signed)
[place] notified me that the patient came into the office upset that she has not heard back anything regarding her medication and that JoEllen was not comfortable speaking with her as she was unsure what to tell her.   I called Lea after looking at all of the notes being sent back and forth and Lea said the patient needed an acute appointment to discuss getting medication. Dr. Rogers Blocker is off on Tuesdays however, the patient insisted on getting scheduled with Dr. Rogers Blocker as that is whom she said Dr. Juleen China referred her to and said that there would not be an issue getting her medications. She began to cry and said this was disappointing that she never received a call at all. I apologized for the uncertainty time that passed without an answer and offered to schedule her with another provider for tomorrow however, she declined and only wanted to see Dr. Rogers Blocker. I told her that there she and Dr. Rogers Blocker could discuss next steps regarding her medication and I would be happy to schedule.   Patient is scheduled to see Dr. Rogers Blocker on Wednesday to discuss getting medication. I apologized that she did not recieve an update sooner. Patient was upset however, was appreciative for getting scheduled to discuss further with Dr. Rogers Blocker.

## 2019-08-16 NOTE — Telephone Encounter (Signed)
See note, please advise. First note was sent 5 days ago.

## 2019-08-16 NOTE — Telephone Encounter (Signed)
Reason for CRM: Pt called in checking on status of medication refill and would like it done by today. Please advise.    Patient has not received a call and medication refill has been denied

## 2019-08-16 NOTE — Telephone Encounter (Signed)
Patient called in again to check status and stated she is completely out and is wanting some in case she starts feeling pain. Please advise.

## 2019-08-16 NOTE — Telephone Encounter (Signed)
Copied from [place] [id number]. Topic: General - Inquiry >> Aug 16, 2019  9:08 AM Richardo Priest, NT wrote: Reason for CRM: Pt called in checking on status of medication refill and would like it done by today. Please advise.

## 2019-08-16 NOTE — Telephone Encounter (Signed)
Please advise not sure who to send to get refill or what I need to tell patient.

## 2019-08-17 NOTE — Telephone Encounter (Signed)
Please see message below

## 2019-08-17 NOTE — Telephone Encounter (Signed)
I called and spoke to the patient to apologize for the delay in anyone responding to her message and let her know that I  Have clarified that patients needing refills on medications  will need to be scheduled for an appointment with a new provider. I also let her know that this is not a guarantee that the new provider will fill that medication. She stated that she was upset that she had been given different messages on how her refills would be managed. She states that she was told by Dr. Juleen China at her last visit that her refills on all of her medications would be handled by the new provider without the need for an appointment. She also stated that she asked Dr. Juleen China if Dr. Rogers Blocker would prescribe her oxycodone and was told that she would. I apologized for any miscommunication that the patient was given. I explained that none of the providers in the office would prescribe a controlled medication without seeing a patient and that all patients would need to be seen to transfer care to a new provider for any refills. She states that she was given different stories each time that she talked to someone. I let her know that the process has been clarified with each staff member. I asked her if there was anything else that I could help her with and she said no and stated that she would see Dr. Rogers Blocker tomorrow.

## 2019-08-18 ENCOUNTER — Ambulatory Visit: Payer: Commercial Managed Care - PPO | Admitting: Family Medicine

## 2019-08-18 ENCOUNTER — Other Ambulatory Visit: Payer: Self-pay

## 2019-08-18 ENCOUNTER — Encounter: Payer: Self-pay | Admitting: Family Medicine

## 2019-08-18 VITALS — BP 134/76 | HR 95 | Temp 97.7°F | Ht 69.0 in | Wt 213.4 lb

## 2019-08-18 DIAGNOSIS — M5442 Lumbago with sciatica, left side: Secondary | ICD-10-CM

## 2019-08-18 DIAGNOSIS — G8929 Other chronic pain: Secondary | ICD-10-CM

## 2019-08-18 DIAGNOSIS — M5441 Lumbago with sciatica, right side: Secondary | ICD-10-CM | POA: Diagnosis not present

## 2019-08-18 MED ORDER — HYDROCODONE-ACETAMINOPHEN 5-325 MG PO TABS: 1.0000 | ORAL_TABLET | Freq: Two times a day (BID) | ORAL | 0 refills | Status: DC

## 2019-08-18 NOTE — Progress Notes (Signed)
Patient: [name] MRN: [id number] DOB: [birth year] PCP: Briscoe Deutscher, DO     Subjective:  Chief Complaint  Patient presents with  . discuss meds    HPI: The patient is a 43 y.o. female who presents today for transferring of care to me from Dr. Juleen China.   Chronic pain in back: MRI reviewed. Mild bilateral foraminal stenosis. Small L5-S1 left central disc protrusion contacts the descending left s1 nerve root in the left lateral recess. She  went and saw a spinal doctor who gave her a shot. Never has seen pain management. She denies any trauma to her back. She has had history of regular sciatica and woke up with pain and subsequently led to getting her MRI. She states she can not function with her pain. Her previous pcp has her on percocet 56m 1-2 tablets bid, trazodone and ativan, gabapentin.  Here to discuss this as she is out of her medication and needs this.   Low blood pressure: Dr. Juleen China stopped her hctz at last visit due to some low readings and "orthostatic hypotension" even though no orthostatic vitals were taken. She has history of this awhile ago and stated she failed a tilt table test. She has an echo being ordered from previous pcp. She was also going to be referred to cardiology if echo is abnormal.   Review of Systems  Constitutional: Negative for chills, fatigue and fever.  HENT: Negative for congestion, postnasal drip, rhinorrhea and sore throat.   Eyes: Negative for visual disturbance.  Respiratory: Positive for shortness of breath.        Patient very anxious today  Cardiovascular: Negative for chest pain, palpitations and leg swelling.  Gastrointestinal: Positive for constipation. Negative for abdominal pain, diarrhea, nausea and vomiting.  Skin: Negative for rash.  Neurological: Positive for dizziness and headaches.  Psychiatric/Behavioral: Positive for sleep disturbance.    Allergies Patient is allergic to cardizem [diltiazem hcl]; citalopram; hydromorphone  hcl; lexapro [escitalopram oxalate]; paxil [paroxetine hcl]; prozac [fluoxetine]; zoloft [sertraline]; valium [diazepam]; and tape.  Past Medical History Patient  has a past medical history of anxiety/panic attacks, Bladder spasms, Chicken pox, Chronic constipation, Chronic headaches, Complication of anesthesia, Depression, Dizziness, Environmental allergies, GERD (gastroesophageal reflux disease), Gestational diabetes (07/08/2018), Hemorrhoid, History of kidney stones, Hypertension, Hypokalemia, chronic, Orthostatic hypotension, Ovarian cyst, Paroxysmal ventricular tachycardia (HCC), PVC (premature ventricular contraction), Renal insufficiency, Sjogren's syndrome ([place]), SOB (shortness of breath), and Syncope and collapse.  Surgical History Patient  has a past surgical history that includes Cesarean section (x 3 LAST ONE 2012); Cystoscopy with stent placement (Bilateral, 03/03/2018); Abdominal hysterectomy; Cystoscopy/ureteroscopy/holmium laser/stent placement (Bilateral, 03/17/2018); Extracorporeal shock wave lithotripsy (Right, 04/22/2019); and Extracorporeal shock wave lithotripsy (Right, 05/03/2019).  Family History Pateint's family history includes AAA (abdominal aortic aneurysm) in her paternal grandfather; Breast cancer in her mother; Depression in her mother; Heart disease in her sister; Lung cancer in her maternal grandfather; Rheum arthritis in her paternal grandmother.  Social History Patient  reports that she quit smoking about 12 years ago. Her smoking use included cigarettes. She quit after 10.00 years of use. She has never used smokeless tobacco. She reports previous alcohol use. She reports that she does not use drugs.    Objective: Vitals:   08/18/19 1122  BP: 134/76  Pulse: 95  Temp: 97.7 F (36.5 C)  TempSrc: Skin  SpO2: 99%  Weight: 213 lb 6.4 oz (96.8 kg)  Height: 5\' 9"  (1.753 m)    Body mass index is 31.51 kg/m.  Physical  Exam Vitals signs reviewed.  Constitutional:       Appearance: Normal appearance.  HENT:     Head: Normocephalic and atraumatic.  Cardiovascular:     Rate and Rhythm: Normal rate and regular rhythm.     Heart sounds: Normal heart sounds.  Pulmonary:     Effort: Pulmonary effort is normal.     Breath sounds: Normal breath sounds.  Abdominal:     General: Abdomen is flat. Bowel sounds are normal.     Palpations: Abdomen is soft.  Neurological:     General: No focal deficit present.     Mental Status: She is alert and oriented to person, place, and time.        Assessment/plan: 1. Chronic bilateral low back pain with bilateral sciatica Discussed with her that I do not feel like all of these medications are in her best interest or safest. I will not px percocet and a BZD together. I feel like it's in her best interest to go to pain clinic and discussed that in my practice I do not do long term narcotics, BZD and especially this combination of drugs together. I will bridge her with pain meds until seen by pain doctors. Will only give norco, 2/day until seen by pain management and then they will take over care. She would be a good candidate for injections/ PT, but can not do PT at this time and is hesitant about injections. Again, went over I will not do this long term and will only give norco until she sees pain management. Will not do BZD with this combination.  - Ambulatory referral to Pain Clinic  -low blood pressure: unsure why it's orthostasis. No orthostatic vitals were taken. Her blood pressure was fine today. I think we can hold off on echo right now especially since finances are tight and she is doing okay. Just let me know if it becomes an issue.      Return if symptoms worsen or fail to improve.   Orland Mustard, MD [facility]   08/18/2019

## 2019-08-18 NOTE — Patient Instructions (Signed)
Plan.... 1) referral to pain management. I won't px narcotics long term, but will bridge you until you are seen by them. Sent in norco instead of percocet. I also will not do benzo's with the narcotics.   2) watch your blood pressure, if having more symptoms let me know and we can send to cards. Dr. Caryl Comes is the specialist. Hold off on echo at this time. I think that is totally reasonable.   Sorry for the frustration.  Dr. Rogers Blocker

## 2019-08-24 NOTE — Telephone Encounter (Signed)
Copied from [place] [id number]. Topic: General - Inquiry >> Aug 24, 2019  8:53 AM [name], NT wrote: Reason for CRM: [name] called in stating they were needing a copy of pt's insurance card faxed over to [phone], attention [name]. The office also received some office notes stating she saw a spinal doctor and was given a shot. Office is needing records of this encounter. Call back is [phone].

## 2019-08-29 ENCOUNTER — Encounter: Payer: Self-pay | Admitting: Family Medicine

## 2019-09-02 ENCOUNTER — Encounter: Payer: Self-pay | Admitting: Family Medicine

## 2019-09-02 ENCOUNTER — Telehealth: Payer: Self-pay

## 2019-09-02 DIAGNOSIS — G8929 Other chronic pain: Secondary | ICD-10-CM

## 2019-09-02 DIAGNOSIS — M5442 Lumbago with sciatica, left side: Secondary | ICD-10-CM

## 2019-09-02 DIAGNOSIS — R252 Cramp and spasm: Secondary | ICD-10-CM

## 2019-09-02 NOTE — Telephone Encounter (Signed)
Please advise if any provider is willing to accept this patient as a  transfer of care.  Former patient of Dr. Juleen China that transferred to Dr. Rogers Blocker.  She is not happy with Dr. Rogers Blocker.  Thanks

## 2019-09-03 ENCOUNTER — Ambulatory Visit: Payer: Commercial Managed Care - PPO | Admitting: Family Medicine

## 2019-09-03 MED ORDER — OXYCODONE-ACETAMINOPHEN 5-325 MG PO TABS: 1.0000 | ORAL_TABLET | Freq: Two times a day (BID) | ORAL | 0 refills | Status: DC | PRN

## 2019-09-03 NOTE — Telephone Encounter (Signed)
Reviewed chart in detail and spoke Janeann Forehand, Therapist, sports. Agree with care plan. One month of pain meds ordered today.  F/u with pain specialist. Referral has been placed.   Will keep Dr. Rogers Blocker as PCP in our office OR will seek another PCP elsewhere.

## 2019-09-13 ENCOUNTER — Encounter: Payer: Self-pay | Admitting: Family Medicine

## 2019-09-14 NOTE — Telephone Encounter (Signed)
Stephanie can you look into this please. Thanks 

## 2019-09-15 ENCOUNTER — Telehealth: Payer: Self-pay | Admitting: Physical Medicine and Rehabilitation

## 2019-09-15 NOTE — Telephone Encounter (Signed)
Received call from pt needing records sent to another doctor. I advised need to sign release form before we can send out records.

## 2019-09-20 ENCOUNTER — Ambulatory Visit: Payer: Self-pay | Admitting: Family Medicine

## 2019-09-20 ENCOUNTER — Other Ambulatory Visit: Payer: Self-pay

## 2019-09-20 NOTE — Telephone Encounter (Signed)
NOted

## 2019-09-20 NOTE — Telephone Encounter (Signed)
FYI app has been made with you

## 2019-09-20 NOTE — Telephone Encounter (Signed)
Pt reports "Spinning" with tilting head back, closing eyes. Onset Friday. States episodes last few seconds. Denies any visual changes, no headache, no new medications. Did complete course of steroids 1 1/2 weeks ago. Reports "Worse when I tilt my head back or lean back in my chair at work." Denies any congestion, no earache. Transferred call to practice, [place], for consideration of virtual appt today. Pt agreeable to another provider. Care advise given per protocol, verbalizes understanding.   Reason for Disposition . [1] MODERATE dizziness (e.g., vertigo; feels very unsteady, interferes with normal activities) AND [2] has NOT been evaluated by physician for this  Answer Assessment - Initial Assessment Questions 1. DESCRIPTION: "Describe your dizziness."    Lightheadedness 2. VERTIGO: "Do you feel like either you or the room is spinning or tilting?"     Yes, spinning, lasts a few seconds 3. LIGHTHEADED: "Do you feel lightheaded?" (e.g., somewhat faint, woozy, weak upon standing)     Not presently 4. SEVERITY: "How bad is it?"  "Can you walk?"   - MILD - Feels unsteady but walking normally.   - MODERATE - Feels very unsteady when walking, but not falling; interferes with normal activities (e.g., school, work) .   - SEVERE - Unable to walk without falling (requires assistance).     Varies 5. ONSET:  "When did the dizziness begin?"     friday 6. AGGRAVATING FACTORS: "Does anything make it worse?" (e.g., standing, change in head position)     Tilting head back, closing eyes 7. CAUSE: "What do you think is causing the dizziness?"     unsure 8. RECURRENT SYMPTOM: "Have you had dizziness before?" If so, ask: "When was the last time?" "What happened that time?"     No 9. OTHER SYMPTOMS: "Do you have any other symptoms?" (e.g., headache, weakness, numbness, vomiting, earache)     None  Protocols used: DIZZINESS - VERTIGO-A-AH

## 2019-09-21 ENCOUNTER — Encounter: Payer: Self-pay | Admitting: Physician Assistant

## 2019-09-21 ENCOUNTER — Ambulatory Visit (INDEPENDENT_AMBULATORY_CARE_PROVIDER_SITE_OTHER): Payer: Commercial Managed Care - PPO | Admitting: Physician Assistant

## 2019-09-21 VITALS — BP 110/78 | HR 80 | Temp 97.2°F | Ht 69.0 in | Wt 218.0 lb

## 2019-09-21 DIAGNOSIS — H8110 Benign paroxysmal vertigo, unspecified ear: Secondary | ICD-10-CM

## 2019-09-21 NOTE — Patient Instructions (Addendum)
It was great to see you!  You will be contacted about your referral to vestibular rehab.  You can consider trying the maneuvers at home.  If any worsening symptoms, please don't hesitate to reach out.  Take care,  Inda Coke PA-C com

## 2019-09-21 NOTE — Progress Notes (Signed)
[name] is a 43 y.o. female here for a follow up of a pre-existing problem.  I acted as a Neurosurgeonscribe for Jessica East CorporationSamantha Miette Molenda, PA-C Jessica Mullonna Orphanos, LPN  History of Present Illness:   Chief Complaint  Patient presents with  . Dizziness    HPI   Dizziness Pt c/o "spinning" with tilting head back, closing eyes. Onset Friday. Denies: head trauma, double vision, recent URI, recent travel/altitude change. States episodes last few seconds. Denies any visual changes, had a headache over the weekend at work. Feels like overall symptoms are getting worse with time. She denies hearing loss, did have some tinnitus with recent prednisone that she took in mid-November.  She sees a [facility] and saw her yesterday. She didn't have any manipulation to her neck but did have some low back manipulation and gentle stretches. When changing positions yesterday to lay on her side and doing gentle stretches caused some reproduction of symptoms and she said the chiropractor noted nystagmus at one point. Denies any falls.  Past Medical History:  Diagnosis Date  . anxiety/panic attacks   . Bladder spasms   . Chicken pox   . Chronic constipation   . Chronic headaches   . Complication of anesthesia    03-03-2018 post-op sx, pt complain chest pain, testing negative and cardiologist consult, dr hochrien note in epic dated 05-21 and 03-04-2018 stating atypical chest pain, not cardiac  . Depression   . Dizziness   . Environmental allergies   . GERD (gastroesophageal reflux disease)   . Gestational diabetes 07/08/2018  . Hemorrhoid   . History of kidney stones   . Hypertension   . Hypokalemia, chronic   . Orthostatic hypotension    per pt get orthostatic with changes of positions quickly --- treatment w/ metoprolol and compression stockings  . Ovarian cyst   . Paroxysmal ventricular tachycardia (HCC)   . PVC (premature ventricular contraction)    intermittant PVC's , per pt symtomatic -- treated with metoprolol  and potassium supplementation  . Renal insufficiency    secondary to sjogrens syndrome--- per [place]ist in [place]  . Sjogren's syndrome (HCC)    rheumotologist-  per pt is in [place]  . SOB (shortness of breath)   . Syncope and collapse      Social History   Socioeconomic History  . Marital status: Married    Spouse name: Not on file  . Number of children: Not on file  . Years of education: Not on file  . Highest education level: Not on file  Occupational History  . Occupation: Teacher, adult educationN    Employer:   Social Needs  . Financial resource strain: Not on file  . Food insecurity    Worry: Not on file    Inability: Not on file  . Transportation needs    Medical: Not on file    Non-medical: Not on file  Tobacco Use  . Smoking status: Former Smoker    Years: 10.00    Types: Cigarettes    Quit date: 03/17/2007    Years since quitting: 12.5  . Smokeless tobacco: Never Used  Substance and Sexual Activity  . Alcohol use: Not Currently    Frequency: Never  . Drug use: Never  . Sexual activity: Not on file  Lifestyle  . Physical activity    Days per week: Not on file    Minutes per session: Not on file  . Stress: Not on file  Relationships  . Social connections  Talks on phone: Not on file    Gets together: Not on file    Attends religious service: Not on file    Active member of club or organization: Not on file    Attends meetings of clubs or organizations: Not on file    Relationship status: Not on file  . Intimate partner violence    Fear of current or ex partner: Not on file    Emotionally abused: Not on file    Physically abused: Not on file    Forced sexual activity: Not on file  Other Topics Concern  . Not on file  Social History Narrative  . Not on file    Past Surgical History:  Procedure Laterality Date  . ABDOMINAL HYSTERECTOMY     PARTIAL  . CESAREAN SECTION  x 3 LAST ONE 2012   W/ BILATERAL TUBLE LIGATION w/ last c/s  .  CYSTOSCOPY WITH STENT PLACEMENT Bilateral 03/03/2018   Procedure: CYSTOSCOPY WITH BILATERAL STENT PLACEMENT;  Surgeon: Irine Seal, MD;  Location: [place];  Service: Urology;  Laterality: Bilateral;  . CYSTOSCOPY/URETEROSCOPY/HOLMIUM LASER/STENT PLACEMENT Bilateral 03/17/2018   Procedure: CYSTOSCOPY BILATERAL URETEROSCOPY/HOLMIUM LASER/STENT PLACEMENT;  Surgeon: Irine Seal, MD;  Location: [facility];  Service: Urology;  Laterality: Bilateral;  . EXTRACORPOREAL SHOCK WAVE LITHOTRIPSY Right 04/22/2019   Procedure: EXTRACORPOREAL SHOCK WAVE LITHOTRIPSY (ESWL);  Surgeon: Bjorn Loser, MD;  [facility];  Service: Urology;  Laterality: Right;  . EXTRACORPOREAL SHOCK WAVE LITHOTRIPSY Right 05/03/2019   Procedure: EXTRACORPOREAL SHOCK WAVE LITHOTRIPSY (ESWL);  Surgeon: Irine Seal, MD;  [facility];  Service: Urology;  Laterality: Right;    Family History  Problem Relation Age of Onset  . Breast cancer Mother   . Depression Mother   . Rheum arthritis Paternal Grandmother   . AAA (abdominal aortic aneurysm) Paternal Grandfather   . Heart disease Sister   . Lung cancer Maternal Grandfather     Allergies  Allergen Reactions  . Cardizem [Diltiazem Hcl] Shortness Of Breath    And chest pain  . Citalopram Other (See Comments)    Has reaction to ALL SSRI's per patient cause suicidal ideation  . Hydromorphone Hcl Shortness Of Breath  . Lexapro [Escitalopram Oxalate] Other (See Comments)    Has reaction to ALL SSRI's per patient cause suicidal ideation  . Norco [Hydrocodone-Acetaminophen] Other (See Comments)    Pt was not able to function, headache and nausea  . Paxil [Paroxetine Hcl] Other (See Comments)    Has reaction to ALL SSRI's per patient cause suicidal ideation  . Prozac [Fluoxetine] Other (See Comments)    Has reaction to ALL SSRI's per patient cause suicidal ideation  . Zoloft [Sertraline] Other (See Comments)    Has reaction to ALL SSRI's per patient cause  suicidal ideation  . Valium [Diazepam]     Panic attack,   . Tape Rash    tegaderm and paper tape    Current Medications:   Current Outpatient Medications:  .  Cholecalciferol (VITAMIN D3) 5000 units CAPS, Take 5,000 Units by mouth daily., Disp: , Rfl:  .  diphenhydrAMINE (BENADRYL) 50 MG capsule, Take 50 mg by mouth at bedtime as needed for sleep., Disp: , Rfl:  .  gabapentin (NEURONTIN) 300 MG capsule, TAKE 1 CAPSULE BY MOUTH THREE TIMES A DAY, Disp: 270 capsule, Rfl: 2 .  GLUCOSAMINE-CHONDROITIN PO, Take 1 tablet by mouth 2 (two) times daily., Disp: , Rfl:  .  L-METHIONINE PO, Take by mouth. 500 mg BID,  Disp: , Rfl:  .  Lactobacillus (ACIDOPHILUS PO), Take by mouth. 100 million qd, Disp: , Rfl:  .  lidocaine (LIDODERM) 5 %, Place 1 patch onto the skin daily. Remove & Discard patch within 12 hours or as directed by MD.       Placed this morning., Disp: , Rfl:  .  loratadine (CLARITIN) 10 MG tablet, Take 10 mg by mouth every evening. , Disp: , Rfl:  .  LORazepam (ATIVAN) 1 MG tablet, Daily as needed, Disp: 30 tablet, Rfl: 3 .  metoprolol succinate (TOPROL-XL) 25 MG 24 hr tablet, Take 25 mg by mouth every morning. , Disp: , Rfl:  .  mycophenolate (CELLCEPT) 250 MG capsule, Take 750 mg by mouth 2 (two) times daily. Take with Cellcept 500 mg to equal 750 mg , Disp: , Rfl:  .  mycophenolate (CELLCEPT) 500 MG tablet, Take 500 mg by mouth 2 (two) times daily. Take with Cellcept 250 mg to equal 750 mg, Disp: , Rfl:  .  omeprazole (PRILOSEC) 20 MG capsule, Take 20 mg by mouth every morning. , Disp: , Rfl:  .  ondansetron (ZOFRAN) 8 MG tablet, Take 1 tablet (8 mg total) by mouth every 8 (eight) hours as needed for nausea or vomiting., Disp: 20 tablet, Rfl: 0 .  oxyCODONE-acetaminophen (PERCOCET/ROXICET) 5-325 MG tablet, Take 1-2 tablets by mouth 2 (two) times daily as needed for severe pain., Disp: 60 tablet, Rfl: 0 .  Polyethylene Glycol 3350 (MIRALAX PO), Take by mouth daily as needed., Disp: ,  Rfl:  .  potassium chloride SA (KLOR-CON) 20 MEQ tablet, Take 20 mEq by mouth 2 (two) times daily., Disp: , Rfl:  .  spironolactone (ALDACTONE) 25 MG tablet, Take 0.5 tablets (12.5 mg total) by mouth every morning., Disp: 90 tablet, Rfl: 1 .  traZODone (DESYREL) 100 MG tablet, TAKE 1 TABLET BY MOUTH AT BEDTIME AS NEEDED FOR SLEEP, Disp: 90 tablet, Rfl: 1 .  TURMERIC CURCUMIN PO, Take by mouth. 1800/150 qd, Disp: , Rfl:  .  promethazine (PHENERGAN) 25 MG tablet, Take 12.5 mg by mouth every 6 (six) hours as needed for nausea or vomiting. Took this am, Disp: , Rfl:    Review of Systems:   ROS  Negative unless otherwise specified per HPI.  Vitals:   Vitals:   09/21/19 0821  BP: 110/78  Pulse: 80  Temp: (!) 97.2 F (36.2 C)  TempSrc: Temporal  SpO2: 97%  Weight: 218 lb (98.9 kg)  Height: 5\' 9"  (1.753 m)     Body mass index is 32.19 kg/m.  Physical Exam:   Physical Exam Vitals signs and nursing note reviewed.  Constitutional:      General: She is not in acute distress.    Appearance: She is well-developed. She is not ill-appearing or toxic-appearing.  Cardiovascular:     Rate and Rhythm: Normal rate and regular rhythm.     Pulses: Normal pulses.     Heart sounds: Normal heart sounds, S1 normal and S2 normal.     Comments: No LE edema Pulmonary:     Effort: Pulmonary effort is normal.     Breath sounds: Normal breath sounds.  Skin:    General: Skin is warm and dry.  Neurological:     General: No focal deficit present.     Mental Status: She is alert.     GCS: GCS eye subscore is 4. GCS verbal subscore is 5. GCS motor subscore is 6.     Cranial Nerves: Cranial  nerves are intact.     Motor: Motor function is intact.     Coordination: Coordination is intact.     Gait: Gait is intact.     Comments: Positive Dix-Hallpike on L. Right side not attempted due to patient's symptoms.  Psychiatric:        Speech: Speech normal.        Behavior: Behavior normal. Behavior is  cooperative.      Assessment and Plan:   Noreta was seen today for dizziness.  Diagnoses and all orders for this visit:  Benign paroxysmal positional vertigo, unspecified laterality -     Ambulatory referral to Physical Therapy   No red flags on history or exam. Dix-Hallpike test positive. I gave her copy of the Epley Maneuvers and she would also like referral to Vestibular Rehab. I have put this referral in for her as urgent. Worsening precautions advised.  . Reviewed expectations re: course of current medical issues. . Discussed self-management of symptoms. . Outlined signs and symptoms indicating need for more acute intervention. . Patient verbalized understanding and all questions were answered. . See orders for this visit as documented in the electronic medical record. . Patient received an After-Visit Summary.  CMA or LPN served as scribe during this visit. History, Physical, and Plan performed by medical provider. The above documentation has been reviewed and is accurate and complete.  Jarold Motto, PA-C

## 2019-09-23 ENCOUNTER — Other Ambulatory Visit: Payer: Self-pay

## 2019-09-23 ENCOUNTER — Encounter: Payer: Self-pay | Admitting: Physical Therapy

## 2019-09-23 ENCOUNTER — Ambulatory Visit (INDEPENDENT_AMBULATORY_CARE_PROVIDER_SITE_OTHER): Payer: Commercial Managed Care - PPO | Admitting: Physical Therapy

## 2019-09-23 DIAGNOSIS — H8112 Benign paroxysmal vertigo, left ear: Secondary | ICD-10-CM

## 2019-09-23 NOTE — Therapy (Addendum)
[facility] Physical Therapy [address] [place], [place], [address] Phone: [phone]   Fax:  [phone]  Physical Therapy Evaluation/Discharge Summary  Patient Details  Name: [name] MRN: [id number] Date of Birth: Sep 06, 1976 Referring Provider (PT): Inda Coke, [place]   Encounter Date: 09/23/2019  PT End of Session - 09/23/19 1036    Visit Number  1    Number of Visits  6    Date for PT Re-Evaluation  11/04/19    PT Start Time  1000    PT Stop Time  1030    PT Time Calculation (min)  30 min    Activity Tolerance  Patient tolerated treatment well    Behavior During Therapy  [facility] for tasks assessed/performed       Past Medical History:  Diagnosis Date  . anxiety/panic attacks   . Bladder spasms   . Chicken pox   . Chronic constipation   . Chronic headaches   . Complication of anesthesia    03-03-2018 post-op sx, pt complain chest pain, testing negative and cardiologist consult, dr hochrien note in epic dated 05-21 and 03-04-2018 stating atypical chest pain, not cardiac  . Depression   . Dizziness   . Environmental allergies   . GERD (gastroesophageal reflux disease)   . Gestational diabetes 07/08/2018  . Hemorrhoid   . History of kidney stones   . Hypertension   . Hypokalemia, chronic   . Orthostatic hypotension    per pt get orthostatic with changes of positions quickly --- treatment w/ metoprolol and compression stockings  . Ovarian cyst   . Paroxysmal ventricular tachycardia ([place])   . PVC (premature ventricular contraction)    intermittant PVC's , per pt symtomatic -- treated with metoprolol and potassium supplementation  . Renal insufficiency    secondary to sjogrens syndrome--- per pt followed by nephrologist in [place]  . Sjogren's syndrome ([place])    rheumotologist-  per pt is in [place]  . SOB (shortness of breath)   . Syncope and collapse     Past Surgical History:  Procedure Laterality Date  . ABDOMINAL HYSTERECTOMY      PARTIAL  . CESAREAN SECTION  x 3 LAST ONE 2012   W/ BILATERAL TUBLE LIGATION w/ last c/s  . CYSTOSCOPY WITH STENT PLACEMENT Bilateral 03/03/2018   Procedure: CYSTOSCOPY WITH BILATERAL STENT PLACEMENT;  Surgeon: Irine Seal, MD;  Location: [place];  Service: Urology;  Laterality: Bilateral;  . CYSTOSCOPY/URETEROSCOPY/HOLMIUM LASER/STENT PLACEMENT Bilateral 03/17/2018   Procedure: CYSTOSCOPY BILATERAL URETEROSCOPY/HOLMIUM LASER/STENT PLACEMENT;  Surgeon: Irine Seal, MD;  Location: [facility];  Service: Urology;  Laterality: Bilateral;  . EXTRACORPOREAL SHOCK WAVE LITHOTRIPSY Right 04/22/2019   Procedure: EXTRACORPOREAL SHOCK WAVE LITHOTRIPSY (ESWL);  Surgeon: Bjorn Loser, MD;  [facility];  Service: Urology;  Laterality: Right;  . EXTRACORPOREAL SHOCK WAVE LITHOTRIPSY Right 05/03/2019   Procedure: EXTRACORPOREAL SHOCK WAVE LITHOTRIPSY (ESWL);  Surgeon: Irine Seal, MD;  [facility];  Service: Urology;  Laterality: Right;    There were no vitals filed for this visit.   Subjective Assessment - 09/23/19 1002    Subjective  Pt is a 43 y/o female who presents to OPPT for dizziness.  She complains of room spinning x 6-7 days ago.  She has a hx of orthostasis but reports this feels different.    Currently in Pain?  Yes   chronic low back pain - will not address at this time   Pain Score  5     Pain  Location  Back    Pain Orientation  Lower    Pain Descriptors / Indicators  Aching;Stabbing    Pain Type  Chronic pain    Pain Radiating Towards  just below knee Rt side    Pain Onset  More than a month ago    Pain Frequency  Constant    Aggravating Factors   moving "wrong"    Pain Relieving Factors  medication         [facility] PT Assessment - 09/23/19 1004      Assessment   Medical Diagnosis  BPPV    Referring Provider (PT)  Inda Coke, PA-C    Onset Date/Surgical Date  09/17/19    Next MD Visit  PRN    Prior Therapy  many years ago      Precautions    Precautions  None      Restrictions   Weight Bearing Restrictions  No      Balance Screen   Has the patient fallen in the past 6 months  No   reports a few close calls   Has the patient had a decrease in activity level because of a fear of falling?   No    Is the patient reluctant to leave their home because of a fear of falling?   No      Home Environment   Living Environment  Private residence    Living Arrangements  Spouse/significant other;Children   57, 42, 35 y/o      Prior Function   Level of Independence  Independent    Vocation  Part time employment    Vocation Requirements  [employer]    Leisure  reading, sewing      Cognition   Overall Cognitive Status  Within Functional Limits for tasks assessed           Vestibular Assessment - 09/23/19 1007      Vestibular Assessment   General Observation  no symptoms at rest      Symptom Behavior   Subjective history of current problem  see subjective    Type of Dizziness   Spinning    Frequency of Dizziness  with certain positional movements; head going back    Duration of Dizziness  < 10 sec    Symptom Nature  Positional    Aggravating Factors  Lying supine;Sitting with head tilted back;Rolling to right;Rolling to left    Relieving Factors  Head stationary;Comments   resolves quickly   Progression of Symptoms  Worse   more frequent     Oculomotor Exam   Oculomotor Alignment  Normal    Spontaneous  Absent    Gaze-induced   Absent    Smooth Pursuits  Intact    Saccades  Intact      Oculomotor Exam-Fixation Suppressed    Left Head Impulse  negative    Right Head Impulse  negative      Vestibulo-Ocular Reflex   VOR 1 Head Only (x 1 viewing)  WNL      Positional Testing   Dix-Hallpike  Dix-Hallpike Left;Dix-Hallpike Right      Dix-Hallpike Right   Dix-Hallpike Right Duration  3-4 sec; very mild nystagmus noted    Dix-Hallpike Right Symptoms  Upbeat, right rotatory nystagmus      Dix-Hallpike Left    Dix-Hallpike Left Duration  8 sec    Dix-Hallpike Left Symptoms  Upbeat, left rotatory nystagmus          Objective measurements  completed on examination: See above findings.       Vestibular Treatment/Exercise - 09/23/19 1036      Vestibular Treatment/Exercise   Vestibular Treatment Provided  Canalith Repositioning    Canalith Repositioning  Epley Manuever Left       EPLEY MANUEVER LEFT   Number of Reps   2    Overall Response   Improved Symptoms     RESPONSE DETAILS LEFT  very mild symptoms 2nd rep; no nystagmus noted            PT Education - 09/23/19 1036    Education Details  BPPV    Person(s) Educated  Patient    Methods  Explanation;Demonstration;Handout    Comprehension  Verbalized understanding;Returned demonstration;Need further instruction          PT Long Term Goals - 09/23/19 1039      PT LONG TERM GOAL #1   Title  independent with HEP if indicated    Status  New    Target Date  11/04/19      PT LONG TERM GOAL #2   Title  demonstrate negative positional testing    Status  New    Target Date  11/04/19             Plan - 09/23/19 1037    Clinical Impression Statement  Pt is a 43 y/o female who presents to OPPT for vertigo x 7 days.  History and clinical findings consistent with Lt pBPPV with near resolve of symptoms on 2nd rep.  Pt will benefit from PT to address deficits listed.    Examination-Activity Limitations  Bed Mobility;Hygiene/Grooming;Bend    Examination-Participation Restrictions  Cleaning    Stability/Clinical Decision Making  Stable/Uncomplicated    Clinical Decision Making  Low    Rehab Potential  Good    PT Frequency  1x / week    PT Duration  6 weeks    PT Treatment/Interventions  ADLs/Self Care Home Management;Canalith Repostioning;Electrical Stimulation;Cryotherapy;Moist Heat;Functional mobility training;Balance training;Therapeutic exercise;Therapeutic activities;Neuromuscular re-education;Patient/family  education;Vestibular    PT Next Visit Plan  reassess canals and tx PRN    Consulted and Agree with Plan of Care  Patient       Patient will benefit from skilled therapeutic intervention in order to improve the following deficits and impairments:  Dizziness  Visit Diagnosis: BPPV (benign paroxysmal positional vertigo), left - Plan: PT plan of care cert/re-cert     Problem List Patient Active Problem List   Diagnosis Date Noted  . Chronic bilateral low back pain with bilateral sciatica 12/23/2018  . Renal calculus 07/17/2018  . Seasonal allergies 07/12/2018  . Vitamin D deficiency 07/12/2018  . Primary insomnia 07/12/2018  . GAD (generalized anxiety disorder) 07/12/2018  . Constipation 07/08/2018  . Sjogren's syndrome ([place]) 07/08/2018  . GERD (gastroesophageal reflux disease)       Laureen Abrahams, PT, DPT 09/23/19 10:41 AM     [facility] Physical Therapy [address] [place], [place], [address] Phone: [phone]   Fax:  [phone]  Name: [name] MRN: [id number] Date of Birth: [birth year]    PHYSICAL THERAPY DISCHARGE SUMMARY  Visits from Start of Care: 1  Current functional level related to goals / functional outcomes: See above   Remaining deficits: n/a   Education / Equipment: n/a  Plan: Patient agrees to discharge.  Patient goals were met. Patient is being discharged due to meeting the stated rehab goals.  ?????    Symptoms resolved.  No further  PT indicated.  Laureen Abrahams, PT, DPT 12/13/19 10:01 AM  [facility] Physical Therapy [address] [place], [place], [address] Phone: [phone]   Fax:  [phone]

## 2019-09-24 ENCOUNTER — Encounter: Payer: Self-pay | Admitting: Physical Therapy

## 2019-09-28 ENCOUNTER — Encounter: Payer: Commercial Managed Care - PPO | Admitting: Physical Therapy

## 2019-09-30 ENCOUNTER — Encounter: Payer: Commercial Managed Care - PPO | Admitting: Physical Therapy

## 2019-10-06 ENCOUNTER — Encounter: Payer: Self-pay | Admitting: Family Medicine

## 2019-10-06 NOTE — Telephone Encounter (Signed)
Do you have any good psychologist/therapist to refer patient to?

## 2019-10-09 ENCOUNTER — Other Ambulatory Visit: Payer: Self-pay | Admitting: Family Medicine

## 2019-10-09 DIAGNOSIS — M5441 Lumbago with sciatica, right side: Secondary | ICD-10-CM

## 2019-10-09 DIAGNOSIS — G8929 Other chronic pain: Secondary | ICD-10-CM

## 2019-10-09 DIAGNOSIS — R252 Cramp and spasm: Secondary | ICD-10-CM

## 2019-10-11 NOTE — Telephone Encounter (Signed)
Okay to refill Gabapentin  

## 2019-11-05 ENCOUNTER — Ambulatory Visit: Payer: Commercial Managed Care - PPO | Admitting: Family Medicine

## 2019-12-20 ENCOUNTER — Other Ambulatory Visit: Payer: Self-pay

## 2019-12-20 ENCOUNTER — Telehealth: Payer: Self-pay

## 2019-12-20 MED ORDER — POTASSIUM CHLORIDE CRYS ER 20 MEQ PO TBCR: 20.0000 meq | EXTENDED_RELEASE_TABLET | Freq: Two times a day (BID) | ORAL | 1 refills | Status: AC

## 2019-12-20 NOTE — Telephone Encounter (Signed)
Sent to pharmacy 

## 2019-12-29 ENCOUNTER — Other Ambulatory Visit: Payer: Self-pay | Admitting: Pain Medicine

## 2019-12-29 DIAGNOSIS — M545 Low back pain, unspecified: Secondary | ICD-10-CM

## 2019-12-29 DIAGNOSIS — M79605 Pain in left leg: Secondary | ICD-10-CM

## 2020-01-26 ENCOUNTER — Other Ambulatory Visit: Payer: Commercial Managed Care - PPO

## 2020-02-07 ENCOUNTER — Other Ambulatory Visit: Payer: Self-pay | Admitting: Family Medicine

## 2020-02-07 NOTE — Telephone Encounter (Signed)
LAST APPOINTMENT DATE: @11 /01/2019  NEXT APPOINTMENT DATE:@no  f/u    LAST REFILL: 07/20/2019  QTY: #90 1 rf

## 2020-02-07 NOTE — Telephone Encounter (Signed)
Please review

## 2020-02-11 ENCOUNTER — Ambulatory Visit
Admission: RE | Admit: 2020-02-11 | Discharge: 2020-02-11 | Disposition: A | Discharge status: Home / Self Care | Payer: Commercial Managed Care - PPO | Source: Ambulatory Visit | Attending: Pain Medicine | Admitting: Pain Medicine

## 2020-02-11 ENCOUNTER — Other Ambulatory Visit: Payer: Self-pay

## 2020-02-11 DIAGNOSIS — M79605 Pain in left leg: Secondary | ICD-10-CM

## 2020-02-11 DIAGNOSIS — M545 Low back pain, unspecified: Secondary | ICD-10-CM

## 2020-06-15 ENCOUNTER — Telehealth: Payer: Self-pay | Admitting: Family Medicine

## 2020-06-15 DIAGNOSIS — R600 Localized edema: Secondary | ICD-10-CM

## 2020-06-15 MED ORDER — SPIRONOLACTONE 25 MG PO TABS: 12.5000 mg | ORAL_TABLET | Freq: Every morning | ORAL | 1 refills | Status: DC

## 2020-06-15 NOTE — Telephone Encounter (Signed)
..   LAST APPOINTMENT DATE: 02/07/2020   NEXT APPOINTMENT DATE:@Visit  date not found  MEDICATION:spironolactone (ALDACTONE) 25 MG tablet  PHARMACY: [id number] - [place], [place] - [address]   **Let patient know to contact pharmacy at the end of the day to make sure medication is ready. **  ** Please notify patient to allow 48-72 hours to process**  **Encourage patient to contact the pharmacy for refills or they can request refills through [facility]**  CLINICAL FILLS OUT ALL BELOW:   LAST REFILL:  QTY:  REFILL DATE:    OTHER COMMENTS:   Okay for refill?  Please advise

## 2020-06-21 ENCOUNTER — Other Ambulatory Visit: Payer: Self-pay

## 2020-06-21 DIAGNOSIS — R600 Localized edema: Secondary | ICD-10-CM

## 2020-06-21 MED ORDER — SPIRONOLACTONE 25 MG PO TABS: 12.5000 mg | ORAL_TABLET | Freq: Every morning | ORAL | 1 refills | Status: DC

## 2020-06-21 NOTE — Telephone Encounter (Signed)
1st rx refill cancelled. Resent to [facility].

## 2020-06-21 NOTE — Telephone Encounter (Signed)
NOTE **  We [employer] [employer] office ) put the wrong pharmacy. Please send prescription to [facility].  So sorry for the inconvenience

## 2020-06-22 ENCOUNTER — Ambulatory Visit (HOSPITAL_COMMUNITY): Payer: Commercial Managed Care - PPO

## 2020-06-22 ENCOUNTER — Encounter (HOSPITAL_COMMUNITY): Payer: Self-pay | Admitting: Urology

## 2020-06-22 ENCOUNTER — Ambulatory Visit (HOSPITAL_COMMUNITY): Payer: Commercial Managed Care - PPO | Admitting: Certified Registered Nurse Anesthetist

## 2020-06-22 ENCOUNTER — Ambulatory Visit (HOSPITAL_COMMUNITY)
Admission: AD | Admit: 2020-06-22 | Discharge: 2020-06-22 | Disposition: A | Discharge status: Home / Self Care | Payer: Commercial Managed Care - PPO | Source: Ambulatory Visit | Attending: Urology | Admitting: Urology

## 2020-06-22 ENCOUNTER — Other Ambulatory Visit: Payer: Self-pay | Admitting: Urology

## 2020-06-22 ENCOUNTER — Other Ambulatory Visit: Payer: Self-pay

## 2020-06-22 ENCOUNTER — Encounter (HOSPITAL_COMMUNITY)
Admission: AD | Disposition: A | Discharge status: Home / Self Care | Payer: Self-pay | Source: Ambulatory Visit | Attending: Urology

## 2020-06-22 DIAGNOSIS — N179 Acute kidney failure, unspecified: Secondary | ICD-10-CM | POA: Insufficient documentation

## 2020-06-22 DIAGNOSIS — Z87442 Personal history of urinary calculi: Secondary | ICD-10-CM | POA: Insufficient documentation

## 2020-06-22 DIAGNOSIS — M3500 Sicca syndrome, unspecified: Secondary | ICD-10-CM | POA: Insufficient documentation

## 2020-06-22 DIAGNOSIS — Z79899 Other long term (current) drug therapy: Secondary | ICD-10-CM | POA: Diagnosis not present

## 2020-06-22 DIAGNOSIS — F419 Anxiety disorder, unspecified: Secondary | ICD-10-CM | POA: Diagnosis not present

## 2020-06-22 DIAGNOSIS — Z79891 Long term (current) use of opiate analgesic: Secondary | ICD-10-CM | POA: Diagnosis not present

## 2020-06-22 DIAGNOSIS — Z87891 Personal history of nicotine dependence: Secondary | ICD-10-CM | POA: Diagnosis not present

## 2020-06-22 DIAGNOSIS — K219 Gastro-esophageal reflux disease without esophagitis: Secondary | ICD-10-CM | POA: Diagnosis not present

## 2020-06-22 DIAGNOSIS — N201 Calculus of ureter: Secondary | ICD-10-CM | POA: Diagnosis present

## 2020-06-22 DIAGNOSIS — Z20822 Contact with and (suspected) exposure to covid-19: Secondary | ICD-10-CM | POA: Diagnosis not present

## 2020-06-22 DIAGNOSIS — N132 Hydronephrosis with renal and ureteral calculous obstruction: Secondary | ICD-10-CM | POA: Diagnosis not present

## 2020-06-22 DIAGNOSIS — F329 Major depressive disorder, single episode, unspecified: Secondary | ICD-10-CM | POA: Insufficient documentation

## 2020-06-22 DIAGNOSIS — Z885 Allergy status to narcotic agent status: Secondary | ICD-10-CM | POA: Diagnosis not present

## 2020-06-22 DIAGNOSIS — Z888 Allergy status to other drugs, medicaments and biological substances status: Secondary | ICD-10-CM | POA: Insufficient documentation

## 2020-06-22 HISTORY — PX: CYSTOSCOPY W/ URETERAL STENT PLACEMENT: SHX1429

## 2020-06-22 LAB — BASIC METABOLIC PANEL
Anion gap: 7 (ref 5–15)
BUN: 18 mg/dL (ref 6–20)
CO2: 20 mmol/L — ABNORMAL LOW (ref 22–32)
Calcium: 8.9 mg/dL (ref 8.9–10.3)
Chloride: 112 mmol/L — ABNORMAL HIGH (ref 98–111)
Creatinine, Ser: 1.75 mg/dL — ABNORMAL HIGH (ref 0.44–1.00)
GFR calc Af Amer: 41 mL/min — ABNORMAL LOW (ref >60)
GFR calc non Af Amer: 35 mL/min — ABNORMAL LOW (ref >60)
Glucose, Bld: 97 mg/dL (ref 70–99)
Potassium: 4 mmol/L (ref 3.5–5.1)
Sodium: 139 mmol/L (ref 135–145)

## 2020-06-22 LAB — SARS CORONAVIRUS 2 BY RT PCR (HOSPITAL ORDER, PERFORMED IN ~~LOC~~ HOSPITAL LAB): SARS Coronavirus 2: NEGATIVE

## 2020-06-22 SURGERY — CYSTOSCOPY, WITH RETROGRADE PYELOGRAM AND URETERAL STENT INSERTION
Anesthesia: General | Site: Ureter | Laterality: Bilateral

## 2020-06-22 MED ORDER — LIDOCAINE 2% (20 MG/ML) 5 ML SYRINGE
INTRAMUSCULAR | Status: AC
  Filled 2020-06-22: qty 5

## 2020-06-22 MED ORDER — FENTANYL CITRATE (PF) 100 MCG/2ML IJ SOLN: 25.0000 ug | Freq: Once | INTRAMUSCULAR | Status: DC

## 2020-06-22 MED ORDER — PROPOFOL 10 MG/ML IV BOLUS
INTRAVENOUS | Status: DC | PRN
  Administered 2020-06-22: 200 mg via INTRAVENOUS

## 2020-06-22 MED ORDER — OXYCODONE-ACETAMINOPHEN 5-325 MG PO TABS: 1.0000 | ORAL_TABLET | Freq: Four times a day (QID) | ORAL | 0 refills | Status: DC | PRN

## 2020-06-22 MED ORDER — LIDOCAINE 2% (20 MG/ML) 5 ML SYRINGE
INTRAMUSCULAR | Status: DC | PRN
  Administered 2020-06-22: 60 mg via INTRAVENOUS

## 2020-06-22 MED ORDER — MIDAZOLAM HCL 2 MG/2ML IJ SOLN
INTRAMUSCULAR | Status: AC
  Filled 2020-06-22: qty 2

## 2020-06-22 MED ORDER — KETOROLAC TROMETHAMINE 10 MG PO TABS: 10.0000 mg | ORAL_TABLET | Freq: Four times a day (QID) | ORAL | 0 refills | Status: DC | PRN

## 2020-06-22 MED ORDER — SODIUM CHLORIDE 0.9 % IR SOLN
Status: DC | PRN
  Administered 2020-06-22: 3000 mL

## 2020-06-22 MED ORDER — IOHEXOL 300 MG/ML  SOLN
INTRAMUSCULAR | Status: DC | PRN
  Administered 2020-06-22: 20 mL

## 2020-06-22 MED ORDER — FENTANYL CITRATE (PF) 100 MCG/2ML IJ SOLN
INTRAMUSCULAR | Status: DC | PRN
Start: 2020-06-22 — End: 2020-06-22
  Administered 2020-06-22: 100 ug via INTRAVENOUS

## 2020-06-22 MED ORDER — MIDAZOLAM HCL 5 MG/5ML IJ SOLN
INTRAMUSCULAR | Status: DC | PRN
  Administered 2020-06-22: 2 mg via INTRAVENOUS

## 2020-06-22 MED ORDER — ONDANSETRON HCL 4 MG/2ML IJ SOLN
INTRAMUSCULAR | Status: DC | PRN
  Administered 2020-06-22: 4 mg via INTRAVENOUS

## 2020-06-22 MED ORDER — DEXAMETHASONE SODIUM PHOSPHATE 10 MG/ML IJ SOLN
INTRAMUSCULAR | Status: DC | PRN
  Administered 2020-06-22: 10 mg via INTRAVENOUS

## 2020-06-22 MED ORDER — FENTANYL CITRATE (PF) 100 MCG/2ML IJ SOLN
25.0000 ug | Freq: Once | INTRAMUSCULAR | Status: AC
  Administered 2020-06-22: 25 ug via INTRAVENOUS
  Filled 2020-06-22: qty 2

## 2020-06-22 MED ORDER — FENTANYL CITRATE (PF) 100 MCG/2ML IJ SOLN
INTRAMUSCULAR | Status: DC
Start: 2020-06-22 — End: 2020-06-23
  Filled 2020-06-22: qty 2

## 2020-06-22 MED ORDER — CEFAZOLIN SODIUM-DEXTROSE 2-4 GM/100ML-% IV SOLN
INTRAVENOUS | Status: AC
  Filled 2020-06-22: qty 100

## 2020-06-22 MED ORDER — FENTANYL CITRATE (PF) 100 MCG/2ML IJ SOLN
25.0000 ug | INTRAMUSCULAR | Status: DC | PRN
  Administered 2020-06-22 (×2): 25 ug via INTRAVENOUS

## 2020-06-22 MED ORDER — PROPOFOL 10 MG/ML IV BOLUS
INTRAVENOUS | Status: AC
  Filled 2020-06-22: qty 20

## 2020-06-22 MED ORDER — FENTANYL CITRATE (PF) 100 MCG/2ML IJ SOLN: 25.0000 ug | INTRAMUSCULAR | Status: DC | PRN

## 2020-06-22 MED ORDER — CHLORHEXIDINE GLUCONATE 0.12 % MT SOLN
15.0000 mL | Freq: Once | OROMUCOSAL | Status: AC
  Administered 2020-06-22: 15 mL via OROMUCOSAL

## 2020-06-22 MED ORDER — ONDANSETRON HCL 4 MG/2ML IJ SOLN
INTRAMUSCULAR | Status: AC
  Filled 2020-06-22: qty 2

## 2020-06-22 MED ORDER — AMISULPRIDE (ANTIEMETIC) 5 MG/2ML IV SOLN
INTRAVENOUS | Status: AC
  Filled 2020-06-22: qty 2

## 2020-06-22 MED ORDER — FENTANYL CITRATE (PF) 100 MCG/2ML IJ SOLN
INTRAMUSCULAR | Status: AC
  Filled 2020-06-22: qty 2

## 2020-06-22 MED ORDER — DEXAMETHASONE SODIUM PHOSPHATE 10 MG/ML IJ SOLN
INTRAMUSCULAR | Status: AC
  Filled 2020-06-22: qty 1

## 2020-06-22 MED ORDER — CEFAZOLIN SODIUM-DEXTROSE 2-4 GM/100ML-% IV SOLN
2.0000 g | Freq: Once | INTRAVENOUS | Status: AC
  Administered 2020-06-22: 2 g via INTRAVENOUS

## 2020-06-22 MED ORDER — LACTATED RINGERS IV SOLN: INTRAVENOUS | Status: DC

## 2020-06-22 MED ORDER — ACETAMINOPHEN 500 MG PO TABS
1000.0000 mg | ORAL_TABLET | Freq: Once | ORAL | Status: AC
  Administered 2020-06-22: 1000 mg via ORAL
  Filled 2020-06-22: qty 2

## 2020-06-22 MED ORDER — AMISULPRIDE (ANTIEMETIC) 5 MG/2ML IV SOLN
10.0000 mg | Freq: Once | INTRAVENOUS | Status: AC
  Administered 2020-06-22: 10 mg via INTRAVENOUS

## 2020-06-22 SURGICAL SUPPLY — 16 items
BAG URO CATCHER STRL LF (MISCELLANEOUS) ×2 IMPLANT
BASKET ZERO TIP NITINOL 2.4FR (BASKET) IMPLANT
BSKT STON RTRVL ZERO TP 2.4FR (BASKET)
CATH INTERMIT  6FR 70CM (CATHETERS) ×2 IMPLANT
CLOTH BEACON ORANGE TIMEOUT ST (SAFETY) ×2 IMPLANT
GLOVE BIOGEL M STRL SZ7.5 (GLOVE) ×2 IMPLANT
GOWN STRL REUS W/TWL LRG LVL3 (GOWN DISPOSABLE) ×2 IMPLANT
GUIDEWIRE ANG ZIPWIRE 038X150 (WIRE) ×2 IMPLANT
GUIDEWIRE STR DUAL SENSOR (WIRE) ×2 IMPLANT
KIT TURNOVER KIT A (KITS) IMPLANT
MANIFOLD NEPTUNE II (INSTRUMENTS) ×2 IMPLANT
PACK CYSTO (CUSTOM PROCEDURE TRAY) ×2 IMPLANT
STENT URET 6FRX26 CONTOUR (STENTS) ×2 IMPLANT
STENT URET 6FRX28 CONTOUR (STENTS) ×2 IMPLANT
TUBING CONNECTING 10 (TUBING) ×2 IMPLANT
TUBING UROLOGY SET (TUBING) IMPLANT

## 2020-06-22 NOTE — Brief Op Note (Signed)
06/22/2020  7:06 PM  PATIENT:  [name]  44 y.o. female  PRE-OPERATIVE DIAGNOSIS:  BILATERAL URETERAL STONES  POST-OPERATIVE DIAGNOSIS:  bilateral renal stones  PROCEDURE:  Procedure(s): CYSTOSCOPY WITH RETROGRADE PYELOGRAM/URETERAL STENT PLACEMENT (Bilateral)  SURGEON:  Surgeon(s) and Role:    * Sebastian Ache, MD - Primary    * Jannifer Hick, MD - Assisting  PHYSICIAN ASSISTANT:   ASSISTANTS: none   ANESTHESIA:   general  EBL:  minimal   BLOOD ADMINISTERED:none  DRAINS: none   LOCAL MEDICATIONS USED:  NONE  SPECIMEN:  Source of Specimen:  bilateral renal pelvis urine  DISPOSITION OF SPECIMEN:  microbilogy for culture.   COUNTS:  YES  TOURNIQUET:  * No tourniquets in log *  DICTATION: .Other Dictation: Dictation Number [id number]  PLAN OF CARE: Discharge to home after PACU  PATIENT DISPOSITION:  PACU - hemodynamically stable.   Delay start of Pharmacological VTE agent (>24hrs) due to surgical blood loss or risk of bleeding: not applicable

## 2020-06-22 NOTE — Anesthesia Procedure Notes (Signed)
Procedure Name: LMA Insertion Date/Time: 06/22/2020 6:35 PM Performed by: [place], Murriel Holwerda C, CRNA Pre-anesthesia Checklist: Patient identified, Emergency Drugs available, Suction available and Patient being monitored Patient Re-evaluated:Patient Re-evaluated prior to induction Oxygen Delivery Method: Circle system utilized Preoxygenation: Pre-oxygenation with 100% oxygen Induction Type: IV induction Ventilation: Mask ventilation without difficulty LMA: LMA inserted LMA Size: 4.0 Number of attempts: 1 Airway Equipment and Method: Bite block Placement Confirmation: positive ETCO2 Tube secured with: Tape Dental Injury: Teeth and Oropharynx as per pre-operative assessment

## 2020-06-22 NOTE — Transfer of Care (Signed)
Immediate Anesthesia Transfer of Care Note  Patient: [name]  Procedure(s) Performed: CYSTOSCOPY WITH RETROGRADE PYELOGRAM/URETERAL STENT PLACEMENT (Bilateral Ureter)  Patient Location: PACU  Anesthesia Type:General  Level of Consciousness: awake, alert  and drowsy  Airway & Oxygen Therapy: Patient Spontanous Breathing and Patient connected to face mask oxygen  Post-op Assessment: Report given to RN and Post -op Vital signs reviewed and stable  Post vital signs: Reviewed and stable  Last Vitals:  Vitals Value Taken Time  BP 113/69 06/22/20 1915  Temp 36.8 C 06/22/20 1914  Pulse 84 06/22/20 1919  Resp 17 06/22/20 1919  SpO2 100 % 06/22/20 1919  Vitals shown include unvalidated device data.  Last Pain:  Vitals:   06/22/20 1800  TempSrc:   PainSc: 0-No pain      Patients Stated Pain Goal: 3 (06/22/20 1527)  Complications: No complications documented.

## 2020-06-22 NOTE — Discharge Instructions (Signed)
General Anesthesia, Adult, Care After This sheet gives you information about how to care for yourself after your procedure. Your health care provider may also give you more specific instructions. If you have problems or questions, contact your health care provider. What can I expect after the procedure? After the procedure, the following side effects are common:  Pain or discomfort at the IV site.  Nausea.  Vomiting.  Sore throat.  Trouble concentrating.  Feeling cold or chills.  Weak or tired.  Sleepiness and fatigue.  Soreness and body aches. These side effects can affect parts of the body that were not involved in surgery. Follow these instructions at home:  For at least 24 hours after the procedure:  Have a responsible adult stay with you. It is important to have someone help care for you until you are awake and alert.  Rest as needed.  Do not: ? Participate in activities in which you could fall or become injured. ? Drive. ? Use heavy machinery. ? Drink alcohol. ? Take sleeping pills or medicines that cause drowsiness. ? Make important decisions or sign legal documents. ? Take care of children on your own. Eating and drinking  Follow any instructions from your health care provider about eating or drinking restrictions.  When you feel hungry, start by eating small amounts of foods that are soft and easy to digest (bland), such as toast. Gradually return to your regular diet.  Drink enough fluid to keep your urine pale yellow.  If you vomit, rehydrate by drinking water, juice, or clear broth. General instructions  If you have sleep apnea, surgery and certain medicines can increase your risk for breathing problems. Follow instructions from your health care provider about wearing your sleep device: ? Anytime you are sleeping, including during daytime naps. ? While taking prescription pain medicines, sleeping medicines, or medicines that make you drowsy.  Return to  your normal activities as told by your health care provider. Ask your health care provider what activities are safe for you.  Take over-the-counter and prescription medicines only as told by your health care provider.  If you smoke, do not smoke without supervision.  Keep all follow-up visits as told by your health care provider. This is important. Contact a health care provider if:  You have nausea or vomiting that does not get better with medicine.  You cannot eat or drink without vomiting.  You have pain that does not get better with medicine.  You are unable to pass urine.  You develop a skin rash.  You have a fever.  You have redness around your IV site that gets worse. Get help right away if:  You have difficulty breathing.  You have chest pain.  You have blood in your urine or stool, or you vomit blood. Summary  After the procedure, it is common to have a sore throat or nausea. It is also common to feel tired.  Have a responsible adult stay with you for the first 24 hours after general anesthesia. It is important to have someone help care for you until you are awake and alert.  When you feel hungry, start by eating small amounts of foods that are soft and easy to digest (bland), such as toast. Gradually return to your regular diet.  Drink enough fluid to keep your urine pale yellow.  Return to your normal activities as told by your health care provider. Ask your health care provider what activities are safe for you. This information is not   intended to replace advice given to you by your health care provider. Make sure you discuss any questions you have with your health care provider. Document Revised: 10/03/2017 Document Reviewed: 05/16/2017 Elsevier Patient Education  2020 [employer] 1 - You may have urinary urgency (bladder spasms) and bloody urine on / off with stent in place. This is normal.  2 - Call MD or go to ER for fever >102, severe pain / nausea /  vomiting not relieved by medications, or acute change in medical status

## 2020-06-22 NOTE — Anesthesia Preprocedure Evaluation (Addendum)
Anesthesia Evaluation  Patient identified by MRN, date of birth, ID band Patient awake    Reviewed: Allergy & Precautions, NPO status , Patient's Chart, lab work & pertinent test results, reviewed documented beta blocker date and time   Airway Mallampati: II  TM Distance: >3 FB Neck ROM: Full    Dental no notable dental hx. (+) Teeth Intact, Dental Advisory Given   Pulmonary neg pulmonary ROS, former smoker,    Pulmonary exam normal breath sounds clear to auscultation       Cardiovascular Normal cardiovascular exam Rhythm:Regular Rate:Normal     Neuro/Psych  Headaches, PSYCHIATRIC DISORDERS Anxiety Depression    GI/Hepatic Neg liver ROS, GERD  Medicated and Controlled,  Endo/Other  H/o Sjogren's on cellcept  Renal/GU Renal InsufficiencyRenal disease  negative genitourinary   Musculoskeletal negative musculoskeletal ROS (+)   Abdominal   Peds  Hematology negative hematology ROS (+)   Anesthesia Other Findings   Reproductive/Obstetrics                            Anesthesia Physical Anesthesia Plan  ASA: III  Anesthesia Plan: General   Post-op Pain Management:    Induction: Intravenous  PONV Risk Score and Plan: 3 and Ondansetron, Dexamethasone and Midazolam  Airway Management Planned: LMA  Additional Equipment:   Intra-op Plan:   Post-operative Plan: Extubation in OR  Informed Consent: I have reviewed the patients History and Physical, chart, labs and discussed the procedure including the risks, benefits and alternatives for the proposed anesthesia with the patient or authorized representative who has indicated his/her understanding and acceptance.     Dental advisory given  Plan Discussed with: CRNA  Anesthesia Plan Comments:         Anesthesia Quick Evaluation

## 2020-06-22 NOTE — H&P (Signed)
Urology Admission H&P  Chief Complaint: Pre-OP BILATERAL Ureteral STent Placement  History of Present Illness:   1 - Bilateral Ureteral Stones / Hydronephrosis - bilateral moderate hydro and likely bilateral proximal ureteral stones by KUB 06/22/20. Cr 1.75, K 4.0.  Today "[name]" is seen to proceed with urgent BILATERAL ureteral stents.   Past Medical History:  Diagnosis Date  . anxiety/panic attacks   . Bladder spasms   . Chicken pox   . Chronic constipation   . Chronic headaches   . Complication of anesthesia    03-03-2018 post-op sx, pt complain chest pain, testing negative and cardiologist consult, dr hochrien note in epic dated 05-21 and 03-04-2018 stating atypical chest pain, not cardiac  . Depression   . Dizziness   . Environmental allergies   . GERD (gastroesophageal reflux disease)   . Gestational diabetes 07/08/2018  . Hemorrhoid   . History of kidney stones   . Hypokalemia, chronic   . Orthostatic hypotension    per pt get orthostatic with changes of positions quickly --- treatment w/ metoprolol and compression stockings  . Ovarian cyst   . Paroxysmal ventricular tachycardia (HCC)   . PVC (premature ventricular contraction)    intermittant PVC's , per pt symtomatic -- treated with metoprolol and potassium supplementation  . Renal insufficiency    secondary to sjogrens syndrome--- per pt followed by nephrologist in [place]  . Sjogren's syndrome (HCC)    rheumotologist-  per pt is in [place]  . SOB (shortness of breath)   . Syncope and collapse    Past Surgical History:  Procedure Laterality Date  . ABDOMINAL HYSTERECTOMY     PARTIAL  . CESAREAN SECTION  x 3 LAST ONE 2012   W/ BILATERAL TUBLE LIGATION w/ last c/s  . CYSTOSCOPY WITH STENT PLACEMENT Bilateral 03/03/2018   Procedure: CYSTOSCOPY WITH BILATERAL STENT PLACEMENT;  Surgeon: Bjorn Pippin, MD;  Location: [facility] OR;  Service: Urology;  Laterality: Bilateral;  . CYSTOSCOPY/URETEROSCOPY/HOLMIUM LASER/STENT PLACEMENT  Bilateral 03/17/2018   Procedure: CYSTOSCOPY BILATERAL URETEROSCOPY/HOLMIUM LASER/STENT PLACEMENT;  Surgeon: Bjorn Pippin, MD;  Location: [facility];  Service: Urology;  Laterality: Bilateral;  . EXPLORATORY LAPAROTOMY  2016ish  . EXTRACORPOREAL SHOCK WAVE LITHOTRIPSY Right 04/22/2019   Procedure: EXTRACORPOREAL SHOCK WAVE LITHOTRIPSY (ESWL);  Surgeon: Alfredo Martinez, MD;  Location: [facility] ORS;  Service: Urology;  Laterality: Right;  . EXTRACORPOREAL SHOCK WAVE LITHOTRIPSY Right 05/03/2019   Procedure: EXTRACORPOREAL SHOCK WAVE LITHOTRIPSY (ESWL);  Surgeon: Bjorn Pippin, MD;  Location: [facility];  Service: Urology;  Laterality: Right;    Home Medications:  Current Facility-Administered Medications  Medication Dose Route Frequency Provider Last Rate Last Admin  . ceFAZolin (ANCEF) 2-4 GM/100ML-% IVPB           . fentaNYL (SUBLIMAZE) injection 25 mcg  25 mcg Intravenous Once Woodrum, Chelsey L, MD      . lactated ringers infusion   Intravenous Continuous Woodrum, Chelsey L, MD 50 mL/hr at 06/22/20 1815 Continued from Pre-op at 06/22/20 1815   Allergies:  Allergies  Allergen Reactions  . Cardizem [Diltiazem Hcl] Shortness Of Breath    And chest pain  . Citalopram Other (See Comments)    Has reaction to ALL SSRI's per patient cause suicidal ideation  . Hydromorphone Hcl Shortness Of Breath  . Lexapro [Escitalopram Oxalate] Other (See Comments)    Has reaction to ALL SSRI's per patient cause suicidal ideation  . Norco [Hydrocodone-Acetaminophen] Other (See Comments)    Pt was not able to function, headache  and nausea  . Paxil [Paroxetine Hcl] Other (See Comments)    Has reaction to ALL SSRI's per patient cause suicidal ideation  . Prozac [Fluoxetine] Other (See Comments)    Has reaction to ALL SSRI's per patient cause suicidal ideation  . Zoloft [Sertraline] Other (See Comments)    Has reaction to ALL SSRI's per patient cause suicidal ideation  . Valium [Diazepam]     Panic attack,    . Tape Rash    tegaderm and paper tape    Family History  Problem Relation Age of Onset  . Breast cancer Mother   . Depression Mother   . Rheum arthritis Paternal Grandmother   . AAA (abdominal aortic aneurysm) Paternal Grandfather   . Heart disease Sister   . Lung cancer Maternal Grandfather    Social History:  reports that she quit smoking about 13 years ago. Her smoking use included cigarettes. She quit after 10.00 years of use. She has never used smokeless tobacco. She reports previous alcohol use. She reports that she does not use drugs.  Review of Systems  Constitutional: Negative for chills and fever.  Genitourinary: Positive for flank pain.  All other systems reviewed and are negative.   Physical Exam:  Vital signs in last 24 hours: Temp:  [98.6 F (37 C)] 98.6 F (37 C) (09/09 1527) Pulse Rate:  [68-78] 69 (09/09 1800) Resp:  [16] 16 (09/09 1800) BP: (127-130)/(89) 130/89 (09/09 1800) SpO2:  [96 %-99 %] 97 % (09/09 1800) Weight:  [94.9 kg] 94.9 kg (09/09 1500) Physical Exam Vitals reviewed.  HENT:     Head: Normocephalic.     Nose: Nose normal.  Eyes:     Pupils: Pupils are equal, round, and reactive to light.  Cardiovascular:     Rate and Rhythm: Normal rate.  Pulmonary:     Effort: Pulmonary effort is normal.  Abdominal:     General: Abdomen is flat.  Genitourinary:    Comments: Mild CVAT at present.  Musculoskeletal:     Cervical back: Normal range of motion.  Skin:    General: Skin is warm.  Neurological:     Mental Status: She is alert.  Psychiatric:        Mood and Affect: Mood normal.     Laboratory Data:  Results for orders placed or performed during the hospital encounter of 06/22/20 (from the past 24 hour(s))  SARS Coronavirus 2 by RT PCR (hospital order, performed in [facility] hospital lab) Nasopharyngeal Nasopharyngeal Swab     Status: None   Collection Time: 06/22/20  3:04 PM   Specimen: Nasopharyngeal Swab  Result Value Ref  Range   SARS Coronavirus 2 NEGATIVE NEGATIVE  Basic metabolic panel     Status: Abnormal   Collection Time: 06/22/20  4:19 PM  Result Value Ref Range   Sodium 139 135 - 145 mmol/L   Potassium 4.0 3.5 - 5.1 mmol/L   Chloride 112 (H) 98 - 111 mmol/L   CO2 20 (L) 22 - 32 mmol/L   Glucose, Bld 97 70 - 99 mg/dL   BUN 18 6 - 20 mg/dL   Creatinine, Ser 0.25 (H) 0.44 - 1.00 mg/dL   Calcium 8.9 8.9 - 85.2 mg/dL   GFR calc non Af Amer 35 (L) >60 mL/min   GFR calc Af Amer 41 (L) >60 mL/min   Anion gap 7 5 - 15   Recent Results (from the past 240 hour(s))  SARS Coronavirus 2 by RT PCR (hospital order,  performed in [facility] hospital lab) Nasopharyngeal Nasopharyngeal Swab     Status: None   Collection Time: 06/22/20  3:04 PM   Specimen: Nasopharyngeal Swab  Result Value Ref Range Status   SARS Coronavirus 2 NEGATIVE NEGATIVE Final    Comment: (NOTE) SARS-CoV-2 target nucleic acids are NOT DETECTED.  The SARS-CoV-2 RNA is generally detectable in upper and lower respiratory specimens during the acute phase of infection. The lowest concentration of SARS-CoV-2 viral copies this assay can detect is 250 copies / mL. A negative result does not preclude SARS-CoV-2 infection and should not be used as the sole basis for treatment or other patient management decisions.  A negative result may occur with improper specimen collection / handling, submission of specimen other than nasopharyngeal swab, presence of viral mutation(s) within the areas targeted by this assay, and inadequate number of viral copies (<250 copies / mL). A negative result must be combined with clinical observations, patient history, and epidemiological information.  Fact Sheet for Patients:   BoilerBrush.com.cy  Fact Sheet for Healthcare Providers: https://pope.com/  This test is not yet approved or  cleared by the [place] FDA and has been authorized for detection and/or  diagnosis of SARS-CoV-2 by FDA under an Emergency Use Authorization (EUA).  This EUA will remain in effect (meaning this test can be used) for the duration of the COVID-19 declaration under Section 564(b)(1) of the Act, 21 U.S.C. section 360bbb-3(b)(1), unless the authorization is terminated or revoked sooner.  Performed at [facility], [address] [place], [place] [address]    Creatinine: Recent Labs    06/22/20 1619  CREATININE 1.75*      Plan:   Proceed as planned with BILATERAL ureteral stent placement. Risks, benefits, alternatives, expected peri-op course, need for staged approach (likely bilateral ureteroscopy in elective setting) discussed.   Sebastian Ache 06/22/2020, 6:16 PM

## 2020-06-22 NOTE — H&P (Signed)
Office Visit Report     06/22/2020   --------------------------------------------------------------------------------   [name]  MRN: [id number]  DOB: 1975-10-16, 44 year old Female  SSN:    PRIMARY CARE:    REFERRING:    PROVIDER:  Irine Seal, M.D.  TREATING:  Jiles Crocker, NP  LOCATION:  [facility] [phone]     --------------------------------------------------------------------------------   CC: I have kidney stones.  HPI: [name] is a 44 year-old female established patient who is here for renal calculi.  03/06/20: Cristalle returns today in f/u. She has a history of stones and had ESWL on 05/03/19 for right renal stones. She is doing well without pain or hematuria. Her last KUB showed a small left mid renal stone. She has microhematuria on UA today.   06/22/2020: CT imaging performed at time of last office visit noted no obvious obstructive signs or ureteral/bladder calculi. She had noted bilateral nonobstructing stone disease. Presents today as an acute working. She was awoken in the middle of the night with pain beginning in the left lower back and flank radiating to the anterior abdomen and somewhat toward the groin. Also associated with increased urinary frequency and urgency. She has had nausea but no vomiting. She took Zofran prior to the right which has helped. She denies visible blood in the urine, dysuria, fevers. She did have some chills at the onset of symptoms. Urinalysis today with increased microscopic hematuria compared to time of last office visit assessment. She denies any interval stone material passage.   The problem is on the left side. She first stated noticing pain on 06/22/2020. This is not her first kidney stone. She is currently having flank pain, groin pain, nausea, and chills. She denies having back pain, vomiting, and fever. She has not caught a stone in her urine strainer since her symptoms began.   She has had eswl for treatment of her stones  in the past.     ALLERGIES: Cardizem Dilaudid - " respiratory distress" SSRI tapes valium - Other Reaction, She has some mood and anger issues after the valium.    MEDICATIONS: Prilosec  Benadryl  Cellcept  Glucosamine  Loratadine 10 mg capsule  Methionine 500 Mg Pill  Neurontin  Oxycodone-Acetaminophen 5 mg-325 mg tablet  Potassium Chloride  Promethazine Hcl 25 mg tablet 1 tablet PO Q 6 H PRN  Toprol Xl 25 mg tablet, extended release 24 hr  Trazodone Hcl  Tumeric  Vitamin D2  Zofran     GU PSH: Cystoscopy Insert Stent, Bilateral - 2019 ESWL, Right - 05/03/2019 Ureteroscopic laser litho, Bilateral - 2019       PSH Notes: C-section X3. Partial hysterectomy in 2014. Exp. Lap in 2016. Renal stents on 03/03/2018. ESWL on 03/17/2018.  steroid injections in spine     NON-GU PSH: None   GU PMH: Microscopic hematuria - 03/06/2020 Renal calculus, She has stable bilateral renal stones. The calcification in the pelvis is a pill shadow. She will return in a year with a KUB. - 03/06/2020, - 06/11/2019, She has cleared a lot of fragments from the right kidney but there is a 3x76m stone in the area of the renal pelvis. I will have her return in a week for a KUB and renal [place], - 05/17/2019, Her ESWL was cancelled because of a panic attack. I will reschedule her with MAC. , - 04/26/2019 (Worsening), She has metabolically active stone disease with an enlarging right renal pelvic stone. I discussed options for therapy including ESWL  and URS and after reviewing the pros and cons she would like to proceed with ESWL. I reviewed the risks of ESWL including bleeding, infection, injury to the kidney or adjacent structures, failure to fragment the stone, need for ancillary procedures, thrombotic events, cardiac arrhythmias and sedation complications. , - 03/22/2019, - 07/29/2018, She has bilateral renal stones on KUB and mild left hydro on [place]. She could be passing a small stone with associated nausea. I am going  to give her tamsulosin and reviewed the side effects. She will return in 2 weeks and if the symptoms persist, we will need to get a CT. , - 07/15/2018, She did have residual renal stones on Renal US at her last visit. , - 07/09/2018, Shaelin has residual renal stones and had Calcium Phosphate as her primary stone. I am going to get a hypercalcuria profile and 2 24 hr urines for stone risk and will have her return in 3 months with a KUB. I have given her information on stone prevention. , - 2019, - 2019, - 2019 Ureteral calculus, She has had some left sided pain and nausea and there is about a 64m shadow in the left pelvis that could be a stone. - 05/17/2019 Dysuria - 07/29/2018 History of urolithiasis, She has distal tubuler acidosis from her Sjogren's syndrome with associated hypocitraturia. She has excellent urine volume and she has been given recommendations from her nephrologist to try to mitigate the acidosis with a change to aldactazide 25-25 1/2 tab daily and the use of L-methionine. I have encouraged her to maintain her high urine output and I will have her f/u with a repeat 24 hr urine collection and KUB in 6 months. UA today has microhematuria. - 07/09/2018 Hypocitraturia, She is not a candidate for potassium citrate because of the higher pH and presence of calcium apatite stones. - 07/09/2018 Other disorders resulting from impaired renal tubular function - 07/09/2018    NON-GU PMH: Nausea (Worsening), I have refilled the phenergan. - 03/22/2019, - 07/15/2018 Anxiety Arrhythmia GERD Sicca syndrome, unspecified    FAMILY HISTORY: Breast Cancer - Mother   SOCIAL HISTORY: Marital Status: Married Preferred Language: English; Ethnicity: Not Hispanic Or Latino; Race: White Current Smoking Status: Patient does not smoke anymore. Smoked for 10 years.   Tobacco Use Assessment Completed: Used Tobacco in last 30 days? Social Drinker.  Drinks 1 caffeinated drink per day. 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 Patient denies burning /pain with urination, get up at night to urinate, leakage of urine, stream starts and stops, trouble starting your stream, have to strain to urinate, and being pregnant.  Gastrointestinal (Upper):   Patient reports nausea. Patient denies vomiting and indigestion/ heartburn.  Gastrointestinal (Lower):   Patient denies diarrhea and constipation.  Constitutional:   Patient denies fever, night sweats, weight loss, and fatigue.  Skin:   Patient denies skin rash/ lesion and itching.  Eyes:   Patient denies blurred vision and double vision.  Ears/ Nose/ Throat:   Patient denies sore throat and sinus problems.  Hematologic/Lymphatic:   Patient denies swollen glands and easy bruising.  Cardiovascular:   Patient denies leg swelling and chest pains.  Respiratory:   Patient denies cough and shortness of breath.  Endocrine:   Patient denies excessive thirst.  Musculoskeletal:   Patient reports back pain. Patient denies joint pain.  Neurological:  Patient denies headaches and dizziness.  Psychologic:   Patient denies depression and anxiety.   Notes: Left Flank pain, down into pelvic, chills were present this morning    VITAL SIGNS:      06/22/2020 11:04 AM  Weight 209 lb / 94.8 kg  Height 69 in / 175.26 cm  BP 107/73 mmHg  Heart Rate 72 /min  Temperature 98.9 F / 37.1 C  BMI 30.9 kg/m2   MULTI-SYSTEM PHYSICAL EXAMINATION:    Constitutional: Well-nourished. No physical deformities. Normally developed. Good grooming.  Neck: Neck symmetrical, not swollen. Normal tracheal position.  Respiratory: No labored breathing, no use of accessory muscles.   Cardiovascular: Normal temperature, normal extremity pulses, no swelling, no varicosities.  Skin: No paleness, no jaundice, no cyanosis. No lesion, no ulcer, no rash.  Neurologic / Psychiatric:  Oriented to time, oriented to place, oriented to person. No depression, no anxiety, no agitation.  Gastrointestinal: No mass, no tenderness, no rigidity, non obese abdomen. No CVA tenderness, there is some left lower abdominal tenderness on exam.  Musculoskeletal: Normal gait and station of head and neck.     Complexity of Data:  Source Of History:  Patient, Medical Record Summary  Records Review:   Previous Doctor Records, Previous Hospital Records, Previous Patient Records  Urine Test Review:   Urinalysis, Urine Culture  X-Ray Review: KUB: Reviewed Films. Discussed With Patient.  C.T. Stone Protocol: Reviewed Films. Reviewed Report.     06/22/20  Urinalysis  Urine Appearance Cloudy   Urine Color Amber   Urine Glucose Neg mg/dL  Urine Bilirubin Neg mg/dL  Urine Ketones Neg mg/dL  Urine Specific Gravity 1.015   Urine Blood 3+ ery/uL  Urine pH 7.0   Urine Protein Trace mg/dL  Urine Urobilinogen 0.2 mg/dL  Urine Nitrites Neg   Urine Leukocyte Esterase 1+ leu/uL  Urine WBC/hpf 6 - 10/hpf   Urine RBC/hpf >60/hpf   Urine Epithelial Cells 0 - 5/hpf   Urine Bacteria Rare (0-9/hpf)   Urine Mucous Not Present   Urine Yeast NS (Not Seen)   Urine Trichomonas Not Present   Urine Cystals NS (Not Seen)   Urine Casts NS (Not Seen)   Urine Sperm Not Present    PROCEDURES:         KUB - 25427  A single view of the abdomen is obtained. There is an approximately 6.7 mm opacity consistent with a proximal ureteral calculi identified outside the left renal shadow between transverse processes of L2 and L3 lumbar vertebrae. Also noted on today's exam is a similar sized opacity consistent with a right proximal ureteral calculi outside the renal shadow between L2 and L3 lumbar vertebrae. Both stones identified today for previously noted within the confines of each renal shadow, confirmed on CT imaging to be nonobstructing. Additional bilateral nonobstructing calculi also identified. No other obvious  ureteral calculi identified on today's exam. Stable appearing left-sided opacity within the pelvic ring again identified but previously confirmed to not be within the ureter.       Patient confirmed No Neulasta OnPro Device.            Renal Ultrasound - 06237  Right Kidney: Length: 13.04 cm Depth: 5.24 cm Cortical Width: 1.18 cm Width: 5.49 cm  Left Kidney: Length: 12.94 cm Depth: 4.94 cm Cortical Width: 1.17 cm Width: 5.04 cm  Left Kidney/Ureter:  1)Moderate Hydro with Dilated Renal Pelvis measuring 0.98cm-----2)1.26cm Upper Pole Cystic Area-----Multiple Stones Visualized--3)1.51cm Upper Pole Stone-----4)1.20cm Mid Pole Stone-----5)? 1.04cm Lower Pole  Stone-----6)0.54cm Lower Pole Stone  Right Kidney/Ureter:  1)Moderate Hydro with Dilated Proximal Ureter measruing 1.10cm and Proximal Stone 0.90cm-----Multiple Stones Visualized 2)0.66cm Upper Pole Stone----3)? 0.57cm Mid Pole Stone-----4)1.01cm and 0.57cm Lower Pole Stones  Bladder:  PVR = 16.45m      Patient confirmed No Neulasta OnPro Device.            Urinalysis w/Scope Dipstick Dipstick Cont'd Micro  Color: Amber Bilirubin: Neg mg/dL WBC/hpf: 6 - 10/hpf  Appearance: Cloudy Ketones: Neg mg/dL RBC/hpf: >60/hpf  Specific Gravity: 1.015 Blood: 3+ ery/uL Bacteria: Rare (0-9/hpf)  pH: 7.0 Protein: Trace mg/dL Cystals: NS (Not Seen)  Glucose: Neg mg/dL Urobilinogen: 0.2 mg/dL Casts: NS (Not Seen)    Nitrites: Neg Trichomonas: Not Present    Leukocyte Esterase: 1+ leu/uL Mucous: Not Present      Epithelial Cells: 0 - 5/hpf      Yeast: NS (Not Seen)      Sperm: Not Present         Morphine 468m- J2270, [id number]: 4 Adm. By: TaClearence CheekUnit: mg Lot No [id number]: IM Exp. Date 07/14/2021  Freq: None Mfgr.:   Site: Right Buttock         Phenergan 2520m J2550, [id number]: 25 Adm. By: TayClearence Cheeknit: mg Lot No [id number]: IM Exp. Date 11/14/2020  Freq: None Mfgr.:   Site: Left Buttock   ASSESSMENT:       ICD-10 Details  1 GU:   Renal calculus - N20.0 Bilateral, Chronic, Exacerbation  2   Ureteral calculus - N20.1 Bilateral, Undiagnosed New Problem   PLAN:            Medications New Meds: Cipro 500 mg tablet 1 tablet PO BID   #14  0 Refill(s)            Orders Labs Urine Culture, BUN/Creatinine(Stat)  X-Rays: KUB    Renal Ultrasound - Bilateral          Schedule Return Visit/Planned Activity: 1 Week - Follow up MD  Return Visit/Planned Activity: ASAP - Schedule Surgery  [facility]: [facility] - [phone] 4mg68mher/Proph/Diag Inj, [place]/Im) - 96- [id number]: [facility]22/2020 [facility] - [id number] 25mg25menergan Per 50 Mg) - [id number]     Document Letter(s):  Created for Patient: Clinical Summary         Notes:   KUB reviewed today with patient's urologist. Renal ultrasound recommended to assess for any obvious obstructive signs which will determine plan of care moving forward. If the right kidney does not demonstrate overt hydronephrosis then it was felt that patient could undergo ESWL on the left first before turning attention to the right ureteral calculus. Another option mentioned was having the patient undergo bilateral ureteroscopy.    Renal ultrasound indicates bilateral hydronephrosis right greater than left. I reviewed with the on-call urologist. Based on patient's presentation and imaging study results, bilateral ureteral stenting is indicated at this time. We reviewed risks versus benefits, potential for side effects and adverse reactions. Other treatment alternatives were discussed but again emphasized importance of proceeding with above recommended procedure based on today's presentation. All questions answered to the best of my ability about upcoming procedure and expected postoperative course with understanding expressed by the patient. She will need to be scheduled in the near future  for definitive management with her primary urologist either in  the form of shockwave lithotripsy or bilateral ureteroscopy. At the advice of the on-call urologist based on UA findings, urinalysis sent for culture and sensitivity today as well as empirically prescribing antimicrobial treatment cover for any underlying bacterial process. A stat/creatinine was also obtained. She did receive promethazine 25 mg IM injection in clinic to help augment her continued nausea and dry heaves as well as pain. Patient observed for an appropriate amount of time after both injections were given. There was some symptom improvement so I felt safe to allow her to proceed with presenting to admitting as directed by our hospital scheduler.   Jiles Crocker, PA  I have reviewed history and examined the patient.  There are no changes to the history and physical except as noted.  Plan for cysto, b/l RPG, b/l stent placement  Date: 06/22/2020 Time: 6:13PM

## 2020-06-23 ENCOUNTER — Encounter (HOSPITAL_COMMUNITY): Payer: Self-pay | Admitting: Urology

## 2020-06-23 LAB — URINE CULTURE
Culture: NO GROWTH
Culture: NO GROWTH

## 2020-06-23 NOTE — Op Note (Signed)
[name], [place] MEDICAL RECORD JS:[id number] ACCOUNT [id number] DATE OF BIRTH:1975/11/25 FACILITY: [facility] LOCATION: [facility] PHYSICIAN:Chanda Laperle Berneice Heinrich, MD  OPERATIVE REPORT  DATE OF PROCEDURE:  06/22/2020  SURGEON:  Sebastian Ache, MD  PREOPERATIVE DIAGNOSES:   1.  Bilateral ureteral stones. 2.  Acute renal failure.  PROCEDURE: 1.  Cystoscopy, bilateral retrograde pyelograms, interpretation. 2.  Insertion of bilateral ureteral stents, right 6 x 28, left 6 x 26 Contour.  ESTIMATED BLOOD LOSS:  Nil.  COMPLICATIONS:  None.  SPECIMENS:  Right and left renal pelvis urine for culture.  INDICATIONS:  The patient is a pleasant 44 year old woman with longstanding history of recurrent urolithiasis.  She was found on workup for colicky flank pain to have bilateral proximal ureteral stones, as well as right greater than left hydronephrosis.   She had worsening of kidney function to a creatinine of 1.7.  Options were discussed ,including recommended path of urgent decompression with stenting and she wished to proceed.  Informed consent was obtained and placed in the medical record.  DESCRIPTION OF PROCEDURE:  The patient being identified, the procedure being bilateral ureteral stent placement was confirmed.  Procedure timeout was performed.  Intravenous antibiotics were administered.  General LMA anesthesia induced.  The patient was  placed into a low lithotomy position.  Sterile field was created, prepped and draped the patient's vagina, introitus and proximal thighs.  Cystourethroscopy was performed using a rigid cystoscope with offset lens.  Inspection of bladder revealed no  diverticula, calcifications, papillary lesions.  Ureteral orifices were singleton bilaterally.  The right ureteral orifice was cannulated with a Sensor wire, followed by a 5-French open-ended catheter and right retrograde pyelogram was obtained.  Right retrograde pyelogram demonstrates a single right ureter, single  system right kidney.  There was hydronephrosis to a calcification in the proximal ureter consistent with known stone.  The Sensor wire was once again advanced to the level of the upper  pole and a new 6 x 26 contour Polaris-type stent was carefully placed.  Given somewhat elongated intrarenal anatomy, this stent was not of sufficient length to curl proximally.  Therefore, it was exchanged for a 6 x 28 Contour-type stent over the Sensor  working wire using cystoscopic and fluoroscopic guidance.  Good proximal and distal planes were noted.  Next, the left ureteral orifice was cannulated with a Sensor wire, followed by open-ended catheter and left retrograde pyelogram was obtained.  Left retrograde pyelogram demonstrated a single left ureter with single system left kidney, somewhat bifid.  There was mild hydronephrosis on the left.  A sample of urine was taken and set aside for culture.  The Sensor wire was once again advanced over  the lower pole and a new 6 x 26 Contour-type stent was placed.  Good proximal and distal planes were noted.  Bladder was empty per cystoscope.  Procedure was then terminated.  The patient tolerated the procedure well.  No immediate perioperative  complications.  The patient was taken to postanesthesia care unit in stable condition with plan for discharge home tonight and a staged approach to her kidney stones in the outpatient setting.  VN/NUANCE  D:06/22/2020 T:06/23/2020 JOB:[id number]

## 2020-06-23 NOTE — Anesthesia Postprocedure Evaluation (Signed)
Anesthesia Post Note  Patient: [name]  Procedure(s) Performed: CYSTOSCOPY WITH RETROGRADE PYELOGRAM/URETERAL STENT PLACEMENT (Bilateral Ureter)     Patient location during evaluation: PACU Anesthesia Type: General Level of consciousness: awake and alert Pain management: pain level controlled Vital Signs Assessment: post-procedure vital signs reviewed and stable Respiratory status: spontaneous breathing, nonlabored ventilation, respiratory function stable and patient connected to nasal cannula oxygen Cardiovascular status: blood pressure returned to baseline and stable Postop Assessment: no apparent nausea or vomiting Anesthetic complications: no   No complications documented.  Last Vitals:  Vitals:   06/22/20 2022 06/22/20 2110  BP: 116/88 118/70  Pulse: 62 74  Resp: 16 16  Temp:  36.5 C  SpO2: 100% 99%    Last Pain:  Vitals:   06/22/20 2110  TempSrc:   PainSc: 2                  Croix Presley L Akina Maish

## 2020-07-01 ENCOUNTER — Other Ambulatory Visit: Payer: Self-pay | Admitting: Family Medicine

## 2020-07-01 DIAGNOSIS — R600 Localized edema: Secondary | ICD-10-CM

## 2020-07-01 DIAGNOSIS — G8929 Other chronic pain: Secondary | ICD-10-CM

## 2020-07-01 DIAGNOSIS — R252 Cramp and spasm: Secondary | ICD-10-CM

## 2020-07-01 DIAGNOSIS — M5442 Lumbago with sciatica, left side: Secondary | ICD-10-CM

## 2020-07-05 ENCOUNTER — Other Ambulatory Visit: Payer: Self-pay | Admitting: Urology

## 2020-07-06 NOTE — Patient Instructions (Addendum)
DUE TO COVID-19 ONLY ONE VISITOR IS ALLOWED TO COME WITH YOU AND STAY IN THE WAITING ROOM ONLY  DURING PRE OP AND PROCEDURE.   IF YOU WILL BE ADMITTED INTO THE HOSPITAL YOU ARE ALLOWED ONE SUPPORT PERSON DURING  VISITATION HOURS ONLY (10AM -8PM)   . The support person may change daily. . The support person must pass our screening, gel in and out, and wear a mask at all times, including in the patient's room. . Patients must also wear a mask when staff or their support person are in the room.   COVID SWAB TESTING MUST BE COMPLETED ON:   Tuesday, 07-11-20 @ 8:40 AM   [address] [place], [place] [address]  (Must self quarantine after testing. Follow instructions on  handout.)   Your procedure is scheduled on: Friday, 07-14-20   Report to [facility] Main  Entrance     Report to admitting at 10:15 AM   Call this number if you have problems the morning of surgery [phone]   Do not eat food :After Midnight.   May have liquids until 9:15 AM day of surgery  CLEAR LIQUID DIET  Foods Allowed                                                                     Foods Excluded  Water, Black Coffee and tea, regular and decaf              liquids that you cannot  Plain Jell-O in any flavor  (No red)                                   see through such as: Fruit ices (not with fruit pulp)                                      milk, soups, orange juice              Iced Popsicles (No red)                                      All solid food                                   Apple juices Sports drinks like Gatorade (No red) Lightly seasoned clear broth or consume(fat free) Sugar, honey syrup    Oral Hygiene is also important to reduce your risk of infection.                                    Remember - BRUSH YOUR TEETH THE MORNING OF SURGERY WITH YOUR REGULAR TOOTHPASTE   Do NOT smoke after Midnight   Take these medicines the morning of surgery with A SIP OF WATER: Gabapentin,   Loratadine, Metoprolol, Omeprazole,  Cellcept, Lorazepam if needed, Oxycodone if needed  You may not have any metal on your body including hair pins, jewelry, and body piercings             Do not wear make-up, lotions, powders, perfumes/cologne, or deodorant             Do not wear nail polish.  Do not shave  48 hours prior to surgery.         Do not bring valuables to the hospital. [facility] [place] IS NOT RESPONSIBLE   FOR VALUABLES.   Contacts, dentures or bridgework may not be worn into surgery.   Patients discharged the day of surgery will not be allowed to drive home.                Please read over the following fact sheets you were given: IF YOU HAVE QUESTIONS ABOUT YOUR PRE OP  INSTRUCTIONS PLEASE CALL [phone]   [place] - Preparing for Surgery Before surgery, you can play an important role.  Because skin is not sterile, your skin needs to be as free of germs as possible.  You can reduce the number of germs on your skin by washing with CHG (chlorahexidine gluconate) soap before surgery.  CHG is an antiseptic cleaner which kills germs and bonds with the skin to continue killing germs even after washing. Please DO NOT use if you have an allergy to CHG or antibacterial soaps.  If your skin becomes reddened/irritated stop using the CHG and inform your nurse when you arrive at Short Stay. Do not shave (including legs and underarms) for at least 48 hours prior to the first CHG shower.  You may shave your face/neck.  Please follow these instructions carefully:  1.  Shower with CHG Soap the night before surgery and the  morning of surgery.  2.  If you choose to wash your hair, wash your hair first as usual with your normal  shampoo.  3.  After you shampoo, rinse your hair and body thoroughly to remove the shampoo.                             4.  Use CHG as you would any other liquid soap.  You can apply chg directly to the skin and wash.  Gently with a  scrungie or clean washcloth.  5.  Apply the CHG Soap to your body ONLY FROM THE NECK DOWN.   Do   not use on face/ open                           Wound or open sores. Avoid contact with eyes, ears mouth and   genitals (private parts).                       Wash face,  Genitals (private parts) with your normal soap.             6.  Wash thoroughly, paying special attention to the area where your    surgery  will be performed.  7.  Thoroughly rinse your body with warm water from the neck down.  8.  DO NOT shower/wash with your normal soap after using and rinsing off the CHG Soap.                9.  Pat yourself dry with a clean towel.  10.  Wear clean pajamas.            11.  Place clean sheets on your bed the night of your first shower and do not  sleep with pets. Day of Surgery : Do not apply any lotions/deodorants the morning of surgery.  Please wear clean clothes to the hospital/surgery center.  FAILURE TO FOLLOW THESE INSTRUCTIONS MAY RESULT IN THE CANCELLATION OF YOUR SURGERY  PATIENT SIGNATURE_________________________________  NURSE SIGNATURE__________________________________  ________________________________________________________________________

## 2020-07-06 NOTE — Progress Notes (Addendum)
COVID Vaccine Completed:  X2  Date COVID Vaccine completed:12-01-19 & 12-23-19 COVID vaccine manufacturer: Pfizer    Moderna   [employer]   PCP - Ernst Bowler, PA-C Cardiologist - Not recently  Chest x-ray -  EKG - 07-10-20 in Epic Stress Test - 5+ years ago ECHO - 07-13-19 in Epic Cardiac Cath -  Pacemaker/ICD device last checked:  Sleep Study - 5+ years ago, negative CPAP -   Fasting Blood Sugar -  Checks Blood Sugar _____ times a day  Blood Thinner Instructions: Aspirin Instructions: Last Dose:  Anesthesia review: Tachycardia & PVCs.  Chest pain after anesthesia 2019,  Takes Metoprolol for palpitations, not hypertension  Patient denies shortness of breath, fever, cough and chest pain at PAT appointment   Patient verbalized understanding of instructions that were given to them at the PAT appointment. Patient was also instructed that they will need to review over the PAT instructions again at home before surgery.

## 2020-07-10 ENCOUNTER — Other Ambulatory Visit: Payer: Self-pay

## 2020-07-10 ENCOUNTER — Encounter (HOSPITAL_COMMUNITY): Payer: Self-pay

## 2020-07-10 ENCOUNTER — Encounter (HOSPITAL_COMMUNITY)
Admission: RE | Admit: 2020-07-10 | Discharge: 2020-07-10 | Disposition: A | Discharge status: Home / Self Care | Payer: Commercial Managed Care - PPO | Source: Ambulatory Visit | Attending: Urology | Admitting: Urology

## 2020-07-10 DIAGNOSIS — Z1152 Encounter for screening for COVID-19: Secondary | ICD-10-CM | POA: Insufficient documentation

## 2020-07-10 DIAGNOSIS — Z01818 Encounter for other preprocedural examination: Secondary | ICD-10-CM | POA: Diagnosis not present

## 2020-07-10 HISTORY — DX: Nausea with vomiting, unspecified: R11.2

## 2020-07-10 HISTORY — DX: Other specified postprocedural states: Z98.890

## 2020-07-10 LAB — BASIC METABOLIC PANEL
Anion gap: 5 (ref 5–15)
BUN: 19 mg/dL (ref 6–20)
CO2: 22 mmol/L (ref 22–32)
Calcium: 8.9 mg/dL (ref 8.9–10.3)
Chloride: 108 mmol/L (ref 98–111)
Creatinine, Ser: 1.01 mg/dL — ABNORMAL HIGH (ref 0.44–1.00)
GFR calc Af Amer: >60 mL/min (ref >60)
GFR calc non Af Amer: >60 mL/min (ref >60)
Glucose, Bld: 103 mg/dL — ABNORMAL HIGH (ref 70–99)
Potassium: 4.3 mmol/L (ref 3.5–5.1)
Sodium: 135 mmol/L (ref 135–145)

## 2020-07-10 LAB — CBC
HCT: 37.9 % (ref 36.0–46.0)
Hemoglobin: 12.1 g/dL (ref 12.0–15.0)
MCH: 30.3 pg (ref 26.0–34.0)
MCHC: 31.9 g/dL (ref 30.0–36.0)
MCV: 95 fL (ref 80.0–100.0)
Platelets: 146 10*3/uL — ABNORMAL LOW (ref 150–400)
RBC: 3.99 MIL/uL (ref 3.87–5.11)
RDW: 12.6 % (ref 11.5–15.5)
WBC: 3.2 10*3/uL — ABNORMAL LOW (ref 4.0–10.5)
nRBC: 0 % (ref 0.0–0.2)

## 2020-07-11 ENCOUNTER — Inpatient Hospital Stay (HOSPITAL_COMMUNITY): Admission: RE | Admit: 2020-07-11 | Payer: Commercial Managed Care - PPO | Source: Ambulatory Visit

## 2020-07-12 DIAGNOSIS — Z01818 Encounter for other preprocedural examination: Secondary | ICD-10-CM | POA: Diagnosis not present

## 2020-07-12 LAB — RESPIRATORY PANEL BY RT PCR (FLU A&B, COVID)
Influenza A by PCR: NEGATIVE
Influenza B by PCR: NEGATIVE
SARS Coronavirus 2 by RT PCR: NEGATIVE

## 2020-07-13 MED ORDER — GENTAMICIN SULFATE 40 MG/ML IJ SOLN
5.0000 mg/kg | INTRAVENOUS | Status: AC
  Administered 2020-07-14: 391.6 mg via INTRAVENOUS
  Filled 2020-07-13 (×3): qty 9.75

## 2020-07-14 ENCOUNTER — Encounter (HOSPITAL_COMMUNITY)
Admission: RE | Disposition: A | Discharge status: Home / Self Care | Payer: Self-pay | Source: Ambulatory Visit | Attending: Urology

## 2020-07-14 ENCOUNTER — Ambulatory Visit (HOSPITAL_COMMUNITY)
Admission: RE | Admit: 2020-07-14 | Discharge: 2020-07-14 | Disposition: A | Discharge status: Home / Self Care | Payer: Commercial Managed Care - PPO | Source: Ambulatory Visit | Attending: Urology | Admitting: Urology

## 2020-07-14 ENCOUNTER — Ambulatory Visit (HOSPITAL_COMMUNITY): Payer: Commercial Managed Care - PPO | Admitting: Anesthesiology

## 2020-07-14 ENCOUNTER — Ambulatory Visit (HOSPITAL_COMMUNITY): Payer: Commercial Managed Care - PPO | Admitting: Physician Assistant

## 2020-07-14 ENCOUNTER — Encounter (HOSPITAL_COMMUNITY): Payer: Self-pay | Admitting: Urology

## 2020-07-14 ENCOUNTER — Other Ambulatory Visit: Payer: Self-pay

## 2020-07-14 ENCOUNTER — Ambulatory Visit (HOSPITAL_COMMUNITY): Payer: Commercial Managed Care - PPO

## 2020-07-14 DIAGNOSIS — Z885 Allergy status to narcotic agent status: Secondary | ICD-10-CM | POA: Diagnosis not present

## 2020-07-14 DIAGNOSIS — N179 Acute kidney failure, unspecified: Secondary | ICD-10-CM | POA: Insufficient documentation

## 2020-07-14 DIAGNOSIS — Z8619 Personal history of other infectious and parasitic diseases: Secondary | ICD-10-CM | POA: Diagnosis not present

## 2020-07-14 DIAGNOSIS — Z87891 Personal history of nicotine dependence: Secondary | ICD-10-CM | POA: Insufficient documentation

## 2020-07-14 DIAGNOSIS — Z90711 Acquired absence of uterus with remaining cervical stump: Secondary | ICD-10-CM | POA: Insufficient documentation

## 2020-07-14 DIAGNOSIS — M3500 Sicca syndrome, unspecified: Secondary | ICD-10-CM | POA: Insufficient documentation

## 2020-07-14 DIAGNOSIS — Z20822 Contact with and (suspected) exposure to covid-19: Secondary | ICD-10-CM | POA: Insufficient documentation

## 2020-07-14 DIAGNOSIS — Z8719 Personal history of other diseases of the digestive system: Secondary | ICD-10-CM | POA: Insufficient documentation

## 2020-07-14 DIAGNOSIS — Z6831 Body mass index (BMI) 31.0-31.9, adult: Secondary | ICD-10-CM | POA: Diagnosis not present

## 2020-07-14 DIAGNOSIS — Z888 Allergy status to other drugs, medicaments and biological substances status: Secondary | ICD-10-CM | POA: Diagnosis not present

## 2020-07-14 DIAGNOSIS — K219 Gastro-esophageal reflux disease without esophagitis: Secondary | ICD-10-CM | POA: Diagnosis not present

## 2020-07-14 DIAGNOSIS — N202 Calculus of kidney with calculus of ureter: Secondary | ICD-10-CM | POA: Insufficient documentation

## 2020-07-14 DIAGNOSIS — Z8632 Personal history of gestational diabetes: Secondary | ICD-10-CM | POA: Insufficient documentation

## 2020-07-14 DIAGNOSIS — E669 Obesity, unspecified: Secondary | ICD-10-CM | POA: Insufficient documentation

## 2020-07-14 HISTORY — PX: HOLMIUM LASER APPLICATION: SHX5852

## 2020-07-14 HISTORY — PX: CYSTOSCOPY WITH RETROGRADE PYELOGRAM, URETEROSCOPY AND STENT PLACEMENT: SHX5789

## 2020-07-14 SURGERY — CYSTOURETEROSCOPY, WITH RETROGRADE PYELOGRAM AND STENT INSERTION
Anesthesia: General | Laterality: Bilateral

## 2020-07-14 MED ORDER — OXYBUTYNIN CHLORIDE 5 MG PO TABS: 5.0000 mg | ORAL_TABLET | Freq: Three times a day (TID) | ORAL | 3 refills | Status: DC | PRN

## 2020-07-14 MED ORDER — ONDANSETRON HCL 4 MG/2ML IJ SOLN: 4.0000 mg | Freq: Once | INTRAMUSCULAR | Status: DC | PRN

## 2020-07-14 MED ORDER — ONDANSETRON HCL 4 MG/2ML IJ SOLN
INTRAMUSCULAR | Status: DC | PRN
  Administered 2020-07-14: 4 mg via INTRAVENOUS

## 2020-07-14 MED ORDER — MEPERIDINE HCL 50 MG/ML IJ SOLN: 6.2500 mg | INTRAMUSCULAR | Status: DC | PRN

## 2020-07-14 MED ORDER — OXYCODONE HCL 5 MG PO TABS: 5.0000 mg | ORAL_TABLET | Freq: Once | ORAL | Status: AC | PRN

## 2020-07-14 MED ORDER — LIDOCAINE 2% (20 MG/ML) 5 ML SYRINGE
INTRAMUSCULAR | Status: DC | PRN
  Administered 2020-07-14: 60 mg via INTRAVENOUS

## 2020-07-14 MED ORDER — ONDANSETRON HCL 4 MG/2ML IJ SOLN
INTRAMUSCULAR | Status: AC
  Filled 2020-07-14: qty 2

## 2020-07-14 MED ORDER — PROPOFOL 1000 MG/100ML IV EMUL
INTRAVENOUS | Status: AC
  Filled 2020-07-14: qty 100

## 2020-07-14 MED ORDER — PROPOFOL 10 MG/ML IV BOLUS
INTRAVENOUS | Status: DC | PRN
  Administered 2020-07-14: 200 mg via INTRAVENOUS

## 2020-07-14 MED ORDER — FENTANYL CITRATE (PF) 100 MCG/2ML IJ SOLN
INTRAMUSCULAR | Status: DC | PRN
  Administered 2020-07-14: 50 ug via INTRAVENOUS

## 2020-07-14 MED ORDER — MIDAZOLAM HCL 5 MG/5ML IJ SOLN
INTRAMUSCULAR | Status: DC | PRN
  Administered 2020-07-14: 2 mg via INTRAVENOUS

## 2020-07-14 MED ORDER — KETOROLAC TROMETHAMINE 10 MG PO TABS: 10.0000 mg | ORAL_TABLET | Freq: Four times a day (QID) | ORAL | 0 refills | Status: DC | PRN

## 2020-07-14 MED ORDER — CHLORHEXIDINE GLUCONATE 0.12 % MT SOLN
15.0000 mL | Freq: Once | OROMUCOSAL | Status: AC
  Administered 2020-07-14: 15 mL via OROMUCOSAL

## 2020-07-14 MED ORDER — LACTATED RINGERS IV SOLN: INTRAVENOUS | Status: DC

## 2020-07-14 MED ORDER — LIDOCAINE 2% (20 MG/ML) 5 ML SYRINGE
INTRAMUSCULAR | Status: AC
  Filled 2020-07-14: qty 5

## 2020-07-14 MED ORDER — ORAL CARE MOUTH RINSE: 15.0000 mL | Freq: Once | OROMUCOSAL | Status: AC

## 2020-07-14 MED ORDER — FENTANYL CITRATE (PF) 100 MCG/2ML IJ SOLN
INTRAMUSCULAR | Status: AC
  Filled 2020-07-14: qty 2

## 2020-07-14 MED ORDER — SCOPOLAMINE 1 MG/3DAYS TD PT72
1.0000 | MEDICATED_PATCH | TRANSDERMAL | Status: DC
  Administered 2020-07-14: 1.5 mg via TRANSDERMAL
  Filled 2020-07-14: qty 1

## 2020-07-14 MED ORDER — PROPOFOL 10 MG/ML IV BOLUS
INTRAVENOUS | Status: AC
  Filled 2020-07-14: qty 20

## 2020-07-14 MED ORDER — MIDAZOLAM HCL 2 MG/2ML IJ SOLN
INTRAMUSCULAR | Status: AC
  Filled 2020-07-14: qty 2

## 2020-07-14 MED ORDER — OXYCODONE-ACETAMINOPHEN 5-325 MG PO TABS: 1.0000 | ORAL_TABLET | Freq: Four times a day (QID) | ORAL | 0 refills | Status: DC | PRN

## 2020-07-14 MED ORDER — DEXAMETHASONE SODIUM PHOSPHATE 10 MG/ML IJ SOLN
INTRAMUSCULAR | Status: DC | PRN
  Administered 2020-07-14: 10 mg via INTRAVENOUS

## 2020-07-14 MED ORDER — SODIUM CHLORIDE 0.9 % IR SOLN
Status: DC | PRN
  Administered 2020-07-14: 6000 mL via INTRAVESICAL

## 2020-07-14 MED ORDER — DEXAMETHASONE SODIUM PHOSPHATE 10 MG/ML IJ SOLN
INTRAMUSCULAR | Status: AC
  Filled 2020-07-14: qty 1

## 2020-07-14 MED ORDER — PROPOFOL 500 MG/50ML IV EMUL
INTRAVENOUS | Status: DC | PRN
  Administered 2020-07-14: 125 ug/kg/min via INTRAVENOUS

## 2020-07-14 MED ORDER — FENTANYL CITRATE (PF) 100 MCG/2ML IJ SOLN
25.0000 ug | INTRAMUSCULAR | Status: DC | PRN
  Administered 2020-07-14: 50 ug via INTRAVENOUS

## 2020-07-14 MED ORDER — IOHEXOL 300 MG/ML  SOLN
INTRAMUSCULAR | Status: DC | PRN
  Administered 2020-07-14: 30 mL via URETHRAL

## 2020-07-14 MED ORDER — OXYCODONE HCL 5 MG/5ML PO SOLN: 5.0000 mg | Freq: Once | ORAL | Status: AC | PRN

## 2020-07-14 MED ORDER — OXYCODONE HCL 5 MG PO TABS
ORAL_TABLET | ORAL | Status: AC
Start: 2020-07-14 — End: 2020-07-14
  Administered 2020-07-14: 5 mg via ORAL
  Filled 2020-07-14: qty 1

## 2020-07-14 SURGICAL SUPPLY — 23 items
BAG URO CATCHER STRL LF (MISCELLANEOUS) ×2 IMPLANT
BASKET LASER NITINOL 1.9FR (BASKET) ×2 IMPLANT
CATH INTERMIT  6FR 70CM (CATHETERS) ×2 IMPLANT
CLOTH BEACON ORANGE TIMEOUT ST (SAFETY) ×2 IMPLANT
EXTRACTOR STONE 1.7FRX115CM (UROLOGICAL SUPPLIES) IMPLANT
GLOVE BIOGEL M STRL SZ7.5 (GLOVE) ×2 IMPLANT
GOWN STRL REUS W/TWL LRG LVL3 (GOWN DISPOSABLE) ×2 IMPLANT
GUIDEWIRE ANG ZIPWIRE 038X150 (WIRE) ×4 IMPLANT
GUIDEWIRE STR DUAL SENSOR (WIRE) ×2 IMPLANT
KIT TURNOVER KIT A (KITS) ×2 IMPLANT
LASER FIB FLEXIVA PULSE ID 365 (Laser) IMPLANT
LASER FIB FLEXIVA PULSE ID 550 (Laser) IMPLANT
LASER FIB FLEXIVA PULSE ID 910 (Laser) IMPLANT
MANIFOLD NEPTUNE II (INSTRUMENTS) ×2 IMPLANT
PACK CYSTO (CUSTOM PROCEDURE TRAY) ×2 IMPLANT
SHEATH URETERAL 12FRX28CM (UROLOGICAL SUPPLIES) ×2 IMPLANT
SHEATH URETERAL 12FRX35CM (MISCELLANEOUS) IMPLANT
STENT POLARIS 5FRX24 (STENTS) ×4 IMPLANT
TRACTIP FLEXIVA PULS ID 200XHI (Laser) ×1 IMPLANT
TRACTIP FLEXIVA PULSE ID 200 (Laser) ×1
TUBE FEEDING 8FR 16IN STR KANG (MISCELLANEOUS) ×2 IMPLANT
TUBING CONNECTING 10 (TUBING) ×2 IMPLANT
TUBING UROLOGY SET (TUBING) ×2 IMPLANT

## 2020-07-14 NOTE — Anesthesia Procedure Notes (Signed)
Procedure Name: LMA Insertion Date/Time: 07/14/2020 1:49 PM Performed by: Jhonnie Garner, CRNA Pre-anesthesia Checklist: Patient identified, Emergency Drugs available, Suction available and Patient being monitored Patient Re-evaluated:Patient Re-evaluated prior to induction Oxygen Delivery Method: Circle system utilized Preoxygenation: Pre-oxygenation with 100% oxygen Induction Type: IV induction Ventilation: Mask ventilation without difficulty LMA: LMA inserted LMA Size: 4.0 Number of attempts: 1 Placement Confirmation: positive ETCO2 and breath sounds checked- equal and bilateral Tube secured with: Tape Dental Injury: Teeth and Oropharynx as per pre-operative assessment

## 2020-07-14 NOTE — Transfer of Care (Signed)
Immediate Anesthesia Transfer of Care Note  Patient: [name]  Procedure(s) Performed: CYSTOSCOPY WITH RETROGRADE PYELOGRAM, URETEROSCOPY AND STENT EXCHANGE, FIRST STAGE (Bilateral ) HOLMIUM LASER APPLICATION (Bilateral )  Patient Location: PACU  Anesthesia Type:General  Level of Consciousness: sedated, patient cooperative and responds to stimulation  Airway & Oxygen Therapy: Patient Spontanous Breathing and Patient connected to face mask oxygen  Post-op Assessment: Report given to RN and Post -op Vital signs reviewed and stable  Post vital signs: Reviewed and stable  Last Vitals:  Vitals Value Taken Time  BP 108/64 07/14/20 1530  Temp    Pulse 77 07/14/20 1531  Resp 14 07/14/20 1531  SpO2 100 % 07/14/20 1531  Vitals shown include unvalidated device data.  Last Pain:  Vitals:   07/14/20 1045  TempSrc: Oral  PainSc: 0-No pain      Patients Stated Pain Goal: 4 (07/14/20 1045)  Complications: No complications documented.

## 2020-07-14 NOTE — Discharge Instructions (Signed)
1 - You may have urinary urgency (bladder spasms) and bloody urine on / off with stent in place. This is normal. ° °2 - Call MD or go to ER for fever >102, severe pain / nausea / vomiting not relieved by medications, or acute change in medical status ° °

## 2020-07-14 NOTE — Brief Op Note (Signed)
07/14/2020  3:18 PM  PATIENT:  [name]  44 y.o. female  PRE-OPERATIVE DIAGNOSIS:  BILATERAL RENAL AND URETERAL STONES  POST-OPERATIVE DIAGNOSIS:  bilteral renal stones  PROCEDURE:  Procedure(s) with comments: CYSTOSCOPY WITH RETROGRADE PYELOGRAM, URETEROSCOPY AND STENT EXCHANGE, FIRST STAGE (Bilateral) - 90 MINS HOLMIUM LASER APPLICATION (Bilateral)  SURGEON:  Surgeon(s) and Role:    * Sebastian Ache, MD - Primary  PHYSICIAN ASSISTANT:   ASSISTANTS: none   ANESTHESIA:   general  EBL:  minimal   BLOOD ADMINISTERED:none  DRAINS: none   LOCAL MEDICATIONS USED:  NONE  SPECIMEN:  Source of Specimen:  bilateral renal / ureteral stone fragments  DISPOSITION OF SPECIMEN:  [facility] for compositional analysis  COUNTS:  YES  TOURNIQUET:  * No tourniquets in log *  DICTATION: .Other Dictation: Dictation Number  [id number]  PLAN OF CARE: Discharge to home after PACU  PATIENT DISPOSITION:  PACU - hemodynamically stable.   Delay start of Pharmacological VTE agent (>24hrs) due to surgical blood loss or risk of bleeding: yes

## 2020-07-14 NOTE — H&P (Signed)
[name] is an 44 y.o. female.    Chief Complaint: Pre-Op BILATERAL ureteroscopic stone manipulation  HPI:   1 - Recurrent Urolithiasis -  Pre 2021 SWL, MET, URS x many  06/2020 - bilateral ureteral and renal stones (total vol 1.2cm each side), bilateral strents placed as ARF with Cr 1.7   PMH sig for obesity, Sjogren's, TAH (benign). Her PCP is J [employer] PA with [employer].   Today "[name]" is seen to proceed with BILATERAL ureteroscopic stone manipulation. C19 screen negative. UCX negattive. Cr 1.1.  Past Medical History:  Diagnosis Date  . anxiety/panic attacks   . Bladder spasms   . Chicken pox   . Chronic constipation   . Chronic headaches    Occasional migraines  . Complication of anesthesia    03-03-2018 post-op sx, pt complain chest pain, testing negative and cardiologist consult, dr hochrien note in epic dated 05-21 and 03-04-2018 stating atypical chest pain, not cardiac  . Depression   . Dizziness   . Environmental allergies   . GERD (gastroesophageal reflux disease)    Prilosec for nausea, not GERD  . Gestational diabetes 07/08/2018  . Hemorrhoid   . History of kidney stones   . Hypokalemia, chronic   . Orthostatic hypotension    per pt get orthostatic with changes of positions quickly --- treatment w/ metoprolol and compression stockings  . Ovarian cyst   . Paroxysmal ventricular tachycardia ([place])   . PONV (postoperative nausea and vomiting)   . PVC (premature ventricular contraction)    intermittant PVC's , per pt symtomatic -- treated with metoprolol and potassium supplementation  . Renal insufficiency    secondary to sjogrens syndrome--- per pt followed by nephrologist in [place]  . Sjogren's syndrome ([place])    rheumotologist-  per pt is in [place]  . SOB (shortness of breath)   . Syncope and collapse     Past Surgical History:  Procedure Laterality Date  . ABDOMINAL HYSTERECTOMY     PARTIAL  . CESAREAN SECTION  x 3 LAST ONE 2012   W/ BILATERAL TUBLE LIGATION  w/ last c/s  . CYSTOSCOPY W/ URETERAL STENT PLACEMENT Bilateral 06/22/2020   Procedure: CYSTOSCOPY WITH RETROGRADE PYELOGRAM/URETERAL STENT PLACEMENT;  Surgeon: Alexis Frock, MD;  Location: [facility];  Service: Urology;  Laterality: Bilateral;  . CYSTOSCOPY WITH STENT PLACEMENT Bilateral 03/03/2018   Procedure: CYSTOSCOPY WITH BILATERAL STENT PLACEMENT;  Surgeon: Irine Seal, MD;  Location: [place];  Service: Urology;  Laterality: Bilateral;  . CYSTOSCOPY/URETEROSCOPY/HOLMIUM LASER/STENT PLACEMENT Bilateral 03/17/2018   Procedure: CYSTOSCOPY BILATERAL URETEROSCOPY/HOLMIUM LASER/STENT PLACEMENT;  Surgeon: Irine Seal, MD;  Location: [facility];  Service: Urology;  Laterality: Bilateral;  . EXPLORATORY LAPAROTOMY  2016ish  . EXTRACORPOREAL SHOCK WAVE LITHOTRIPSY Right 04/22/2019   Procedure: EXTRACORPOREAL SHOCK WAVE LITHOTRIPSY (ESWL);  Surgeon: Bjorn Loser, MD;  Location: [facility];  Service: Urology;  Laterality: Right;  . EXTRACORPOREAL SHOCK WAVE LITHOTRIPSY Right 05/03/2019   Procedure: EXTRACORPOREAL SHOCK WAVE LITHOTRIPSY (ESWL);  Surgeon: Irine Seal, MD;  Location: [facility];  Service: Urology;  Laterality: Right;  . WISDOM TOOTH EXTRACTION      Family History  Problem Relation Age of Onset  . Breast cancer Mother   . Depression Mother   . Rheum arthritis Paternal Grandmother   . AAA (abdominal aortic aneurysm) Paternal Grandfather   . Heart disease Sister   . Lung cancer Maternal Grandfather    Social History:  reports that she quit smoking about 13 years ago. Her smoking  use included cigarettes. She quit after 10.00 years of use. She has never used smokeless tobacco. She reports current alcohol use. She reports that she does not use drugs.  Allergies:  Allergies  Allergen Reactions  . Cardizem [Diltiazem Hcl] Shortness Of Breath    And chest pain  . Citalopram Other (See Comments)    Has reaction to ALL SSRI's per patient cause suicidal ideation  . Hydromorphone Hcl  Shortness Of Breath  . Lexapro [Escitalopram Oxalate] Other (See Comments)    Has reaction to ALL SSRI's per patient cause suicidal ideation  . Norco [Hydrocodone-Acetaminophen] Other (See Comments)    Pt was not able to function, headache and nausea  . Paxil [Paroxetine Hcl] Other (See Comments)    Has reaction to ALL SSRI's per patient cause suicidal ideation  . Prozac [Fluoxetine] Other (See Comments)    Has reaction to ALL SSRI's per patient cause suicidal ideation  . Zoloft [Sertraline] Other (See Comments)    Has reaction to ALL SSRI's per patient cause suicidal ideation  . Valium [Diazepam]     Panic attack,   . Tape Rash    tegaderm and paper tape    No medications prior to admission.    Results for orders placed or performed during the hospital encounter of 07/14/20 (from the past 48 hour(s))  Respiratory Panel by RT PCR (Flu A&B, Covid) -     Status: None   Collection Time: 07/12/20  7:50 PM  Result Value Ref Range   SARS Coronavirus 2 by RT PCR NEGATIVE NEGATIVE    Comment: (NOTE) SARS-CoV-2 target nucleic acids are NOT DETECTED.  The SARS-CoV-2 RNA is generally detectable in upper respiratoy specimens during the acute phase of infection. The lowest concentration of SARS-CoV-2 viral copies this assay can detect is 131 copies/mL. A negative result does not preclude SARS-Cov-2 infection and should not be used as the sole basis for treatment or other patient management decisions. A negative result may occur with  improper specimen collection/handling, submission of specimen other than nasopharyngeal swab, presence of viral mutation(s) within the areas targeted by this assay, and inadequate number of viral copies (<131 copies/mL). A negative result must be combined with clinical observations, patient history, and epidemiological information. The expected result is Negative.  Fact Sheet for Patients:  PinkCheek.be  Fact Sheet for  Healthcare Providers:  GravelBags.it  This test is no t yet approved or cleared by the [place] FDA and  has been authorized for detection and/or diagnosis of SARS-CoV-2 by FDA under an Emergency Use Authorization (EUA). This EUA will remain  in effect (meaning this test can be used) for the duration of the COVID-19 declaration under Section 564(b)(1) of the Act, 21 U.S.C. section 360bbb-3(b)(1), unless the authorization is terminated or revoked sooner.     Influenza A by PCR NEGATIVE NEGATIVE   Influenza B by PCR NEGATIVE NEGATIVE    Comment: (NOTE) The Xpert Xpress SARS-CoV-2/FLU/RSV assay is intended as an aid in  the diagnosis of influenza from Nasopharyngeal swab specimens and  should not be used as a sole basis for treatment. Nasal washings and  aspirates are unacceptable for Xpert Xpress SARS-CoV-2/FLU/RSV  testing.  Fact Sheet for Patients: PinkCheek.be  Fact Sheet for Healthcare Providers: GravelBags.it  This test is not yet approved or cleared by the [place] FDA and  has been authorized for detection and/or diagnosis of SARS-CoV-2 by  FDA under an Emergency Use Authorization (EUA). This EUA will remain  in effect (meaning  this test can be used) for the duration of the  Covid-19 declaration under Section 564(b)(1) of the Act, 21  U.S.C. section 360bbb-3(b)(1), unless the authorization is  terminated or revoked. Performed at [facility], [place] [address], [place], [place] [address]    No results found.  Review of Systems  Constitutional: Negative for chills and fever.  Genitourinary: Positive for urgency.  All other systems reviewed and are negative.   There were no vitals taken for this visit. Physical Exam Vitals reviewed.  HENT:     Head: Normocephalic.     Nose: Nose normal.     Mouth/Throat:     Mouth: Mucous membranes are moist.  Eyes:      Pupils: Pupils are equal, round, and reactive to light.  Cardiovascular:     Rate and Rhythm: Normal rate.     Pulses: Normal pulses.  Pulmonary:     Effort: Pulmonary effort is normal.  Abdominal:     Comments: Stable truncal obesity.   Genitourinary:    Comments: No CVAT at present.  Musculoskeletal:        General: Normal range of motion.     Cervical back: Normal range of motion.  Skin:    General: Skin is warm.  Neurological:     General: No focal deficit present.     Mental Status: She is alert.  Psychiatric:        Mood and Affect: Mood normal.      Assessment/Plan  Proceed as planned with BILATERAL ureteroscopic stone manipulation. Risks, benefits, alternatives, expected peri-op course (incluidng need for possible stage approach)  discussed previously and reiterated today   Alexis Frock, MD 07/14/2020, 5:45 AM

## 2020-07-14 NOTE — Anesthesia Postprocedure Evaluation (Signed)
Anesthesia Post Note  Patient: [name]  Procedure(s) Performed: CYSTOSCOPY WITH RETROGRADE PYELOGRAM, URETEROSCOPY AND STENT EXCHANGE, FIRST STAGE (Bilateral ) HOLMIUM LASER APPLICATION (Bilateral )     Patient location during evaluation: PACU Anesthesia Type: General Level of consciousness: awake and alert and oriented Pain management: pain level controlled Vital Signs Assessment: post-procedure vital signs reviewed and stable Respiratory status: spontaneous breathing, nonlabored ventilation and respiratory function stable Cardiovascular status: blood pressure returned to baseline and stable Postop Assessment: no apparent nausea or vomiting Anesthetic complications: no   No complications documented.  Last Vitals:  Vitals:   07/14/20 1600 07/14/20 1615  BP: (!) 92/58 115/78  Pulse: 67 66  Resp: 12 (!) 9  Temp:    SpO2: 92% 92%    Last Pain:  Vitals:   07/14/20 1615  TempSrc:   PainSc: Asleep                 Christoph Copelan A.

## 2020-07-14 NOTE — Anesthesia Preprocedure Evaluation (Addendum)
Anesthesia Evaluation  Patient identified by MRN, date of birth, ID band Patient awake    History of Anesthesia Complications (+) PONV  Airway Mallampati: II  TM Distance: >3 FB Neck ROM: Full    Dental  (+) Teeth Intact   Pulmonary former smoker,    Pulmonary exam normal        Cardiovascular  Rhythm:Regular Rate:Normal  PVCs   Neuro/Psych  Headaches, Anxiety Depression    GI/Hepatic Neg liver ROS, GERD  ,  Endo/Other    Renal/GU Renal diseaseSjogren's syndrome related   Renal stones    Musculoskeletal Sjogren's syndrome   Abdominal (+)  Abdomen: soft. Bowel sounds: normal.  Peds  Hematology   Anesthesia Other Findings   Reproductive/Obstetrics                           Anesthesia Physical Anesthesia Plan  ASA: II  Anesthesia Plan: General   Post-op Pain Management:    Induction:   PONV Risk Score and Plan: 4 or greater and Propofol infusion, Scopolamine patch - Pre-op, Ondansetron, Dexamethasone and Treatment may vary due to age or medical condition  Airway Management Planned: Mask and LMA  Additional Equipment: None  Intra-op Plan:   Post-operative Plan: Extubation in OR  Informed Consent: I have reviewed the patients History and Physical, chart, labs and discussed the procedure including the risks, benefits and alternatives for the proposed anesthesia with the patient or authorized representative who has indicated his/her understanding and acceptance.     Dental advisory given  Plan Discussed with: CRNA and Surgeon  Anesthesia Plan Comments: (Lab Results      Component                Value               Date                      WBC                      3.2 (L)             07/10/2020                HGB                      12.1                07/10/2020                HCT                      37.9                07/10/2020                MCV                       95.0                07/10/2020                PLT                      146 (L)             07/10/2020  Covid-19 Nucleic Acid Test Results Lab Results      Component                Value               Date                      SARSCOV2NAA              NEGATIVE            07/12/2020                SARSCOV2NAA              NEGATIVE            06/22/2020                SARSCOV2NAA              NEGATIVE            04/30/2019          )       Anesthesia Quick Evaluation

## 2020-07-15 ENCOUNTER — Encounter (HOSPITAL_COMMUNITY): Payer: Self-pay | Admitting: Urology

## 2020-07-15 NOTE — Op Note (Signed)
NAMELOUANN, Mays MEDICAL RECORD QP:[id number] ACCOUNT [id number] DATE OF BIRTH:1975/11/26 FACILITY: [facility] LOCATION: [facility] PHYSICIAN:Keliah Harned Berneice Heinrich, MD  OPERATIVE REPORT  DATE OF PROCEDURE:  07/14/2020  SURGEON:  Sebastian Ache, MD  PREOPERATIVE DIAGNOSIS:  Bilateral ureteral and renal stones, status post prior stenting.  PROCEDURE: 1.  Cystoscopy, bilateral retrograde pyelograms, interpretation. 2.  First-stage bilateral ureteroscopy with laser lithotripsy. 3.  Exchange of bilateral ureteral stents, 5 x 24 Polaris, no tether.  ESTIMATED BLOOD LOSS:  Nil.  COMPLICATIONS:  None.  SPECIMEN:  Bilateral renal stone fragments for composition analysis.  FINDINGS: 1.  Bilateral proximal ureteral stones, right very impacted. 2.  Bilateral large volume intrarenal stone, estimated 2 cm volume each kidney. 3.  Ablation and removal of estimated 50%  bilateral stone volume. 4.  Successful placement of bilateral ureteral stents, proximal end in the renal pelvis, distal end in urinary bladder.  INDICATIONS:  The patient is a 44 year old lady with longstanding history of recurrent urolithiasis.  She was found on workup for colicky flank pain to have acute renal failure and had bilateral ureteral stones last month.  She underwent temporizing  measure with bilateral stenting.  Plan for ureteroscopy likely in a staged fashion in the elective setting.  She presents for first stage procedure today.  Renal function returned to normal.  Informed consent was obtained and placed in the medical  record.  DESCRIPTION OF PROCEDURE:  The patient being identified, the procedure being bilateral first-stage ureteroscopic stimulation was confirmed.  Procedure timeout was performed.  Intravenous antibiotics administered, general LMA anesthesia induced.  The  patient was placed into a low lithotomy position, sterile field was created, prepped and draped the patient's vagina, introitus and proximal thighs  using iodine.  Cystourethroscopy was performed using 21-French rigid cystoscope with offset lens.   Inspection of urinary bladder revealed bilateral distal stents in situ.  The distal end of the right stent was grasped, brought to the level of the urethral meatus and a 0.03 ZIPwire was advanced to lower pole, exchanged for and open-ended catheter to  the level of the midureter and right retrograde pyelogram was obtained.  Right retrograde pyelogram demonstrated a single right ureter, single system right kidney.  There was a large filling defect in the proximal ureter consistent with known stone.  This appeared to be quite impacted.  The ZIPwire was once again advanced and  set aside as a safety wire.  Similarly, the left distal stent was grasped, brought to the level of the urethral meatus.  A 0.03 ZIPwire was advanced to the level of the upper pole, exchanged for and open-ended catheter and left retrograde pyelogram was  obtained.  Left retrograde pyelogram demonstrated a single left ureter single, system left kidney.  There was a large filling defect in the midureter consistent with known stone.  A ZIPwire was once again advanced, set aside as a safety wire.  An 8-French feeding  tube was placed in the urinary bladder for pressure release, and semirigid ureteroscopy performed the distal half of the left ureter alongside a separate Sensor working wire.  At the upper reaches of the ureteroscope, a dominant calcification was noted  in the left ureter.  It was much too large for simple basketing.  Given its location, it was felt best for the  patient to remove this with sheath access.  So the Sensor wire was left in place as a working wire and semirigid ureteroscopy performed of the  distal half of the right ureter alongside a  separate Sensor working wire.  No mucosal abnormalities were found.  Stone was not visualized directly, but on fluoroscopy, it was noted to be just beyond the upper regions of the  semirigid scope.  The Sensor  wire was left in place as a working wire.  Next, the 12/14 short length ureteral access sheath was placed over the left-sided Sensor working wire to the level of the midureter using continuous fluoroscopic guidance, taking exquisite care not to pass the  sheath proximal to the previously visualized segment and flexible digital ureteroscopy was performed of the left ureter using a single channel ureteroscope.  As suspected, in the ureter there was a very large stone.  It was much too large for simple  basketing.  Holmium laser energy was then applied at 70 setting of 0.2 joules and 20 Hz, and approximately 70% of the stone was dusted, 30% fragmented, fragments removed and set aside for composition analysis.  Ureteroscopy proximal to this revealed very  large volume stone within the kidney, essentially in all calices.  Estimated total volume was approximately 2 cm.  Holmium laser energy was then placed through these stones using settings of 0.2 joules and 20 Hz using a dusting technique.  Estimated at  approximately 50% of stone volume was able to be ablated, some of them fragmented.  Given the volume of stone, after this, there was inherently so much stone dust and debris that visualization was quite poor and it was clearly noted that a staged  approach would be warranted.  The ureteroscope and access sheath was removed under continuous vision.  No significant mucosal abnormalities were found.  Next, the sheath was placed over the right Sensor working wire to the level of the midureter on the  right side and flexible ureteroscopy was performed.  In the proximal ureter, there was an incredibly impacted right stone with significant mucosal overgrowth circumferentially.  Holmium laser energy was then exquisitely carefully applied to the stone and  using setting 0.2 joules and 20 Hz, and then using dusting technique, approximately 70% of the stone was ablated, remaining 30% in  retrograde position to the level of the kidney to the upper pole calix where the tumor was completely dusted.  The right  kidney also had multifocal, very large volume stone, estimated at approximately 2 cm total and a similar dusting technique was used using settings of 0.2 joules and 20 Hz.  Estimated approximately 40% of the total stone volume was dusted using this  technique.  Similar to the left, visualization became quite poor.  It was clearly felt that a staged approach would be warranted.  As such, the access sheath was removed under continuous vision.  No significant mucosal abnormalities were found.  There  was significant edema in this prior stone impaction, but no evidence of any perforation.  As such, a new 5 x 24 Polaris-type stent was placed over the remaining safety wire on the right side using fluoroscopic guidance.  Good proximal and distal planes  were noted.  A separate 5 x 24 Polaris stent was placed over the left safety wire using fluoroscopic guidance.  Good proximal and distal planes were noted.  The bladder was inspected with the cystoscope.  The procedure was then terminated.  The patient  tolerated the procedure well.  There were no immediate perioperative complications.  The patient was taken to the postanesthesia care unit in stable condition.  Plan for discharge home.  She will be scheduled for a second-stage procedure purposely  approximately 2-3 weeks.  VN/NUANCE  D:07/14/2020 T:07/14/2020 JOB:[id number]

## 2020-07-23 ENCOUNTER — Other Ambulatory Visit: Payer: Self-pay

## 2020-07-23 ENCOUNTER — Encounter (HOSPITAL_COMMUNITY): Payer: Self-pay

## 2020-07-23 ENCOUNTER — Emergency Department (HOSPITAL_COMMUNITY): Payer: Commercial Managed Care - PPO

## 2020-07-23 ENCOUNTER — Emergency Department (HOSPITAL_COMMUNITY)
Admission: EM | Admit: 2020-07-23 | Discharge: 2020-07-23 | Disposition: A | Discharge status: Home / Self Care | Payer: Commercial Managed Care - PPO | Attending: Emergency Medicine | Admitting: Emergency Medicine

## 2020-07-23 DIAGNOSIS — Z87891 Personal history of nicotine dependence: Secondary | ICD-10-CM | POA: Diagnosis not present

## 2020-07-23 DIAGNOSIS — R55 Syncope and collapse: Secondary | ICD-10-CM | POA: Diagnosis present

## 2020-07-23 DIAGNOSIS — N3001 Acute cystitis with hematuria: Secondary | ICD-10-CM | POA: Insufficient documentation

## 2020-07-23 DIAGNOSIS — Z79899 Other long term (current) drug therapy: Secondary | ICD-10-CM | POA: Diagnosis not present

## 2020-07-23 LAB — CBC
HCT: 39.8 % (ref 36.0–46.0)
Hemoglobin: 12.9 g/dL (ref 12.0–15.0)
MCH: 30.4 pg (ref 26.0–34.0)
MCHC: 32.4 g/dL (ref 30.0–36.0)
MCV: 93.9 fL (ref 80.0–100.0)
Platelets: 162 10*3/uL (ref 150–400)
RBC: 4.24 MIL/uL (ref 3.87–5.11)
RDW: 13 % (ref 11.5–15.5)
WBC: 3.3 10*3/uL — ABNORMAL LOW (ref 4.0–10.5)
nRBC: 0 % (ref 0.0–0.2)

## 2020-07-23 LAB — BASIC METABOLIC PANEL
Anion gap: 11 (ref 5–15)
BUN: 17 mg/dL (ref 6–20)
CO2: 21 mmol/L — ABNORMAL LOW (ref 22–32)
Calcium: 9.3 mg/dL (ref 8.9–10.3)
Chloride: 107 mmol/L (ref 98–111)
Creatinine, Ser: 1.27 mg/dL — ABNORMAL HIGH (ref 0.44–1.00)
GFR, Estimated: 52 mL/min — ABNORMAL LOW (ref >60)
Glucose, Bld: 105 mg/dL — ABNORMAL HIGH (ref 70–99)
Potassium: 4.2 mmol/L (ref 3.5–5.1)
Sodium: 139 mmol/L (ref 135–145)

## 2020-07-23 LAB — TROPONIN I (HIGH SENSITIVITY)
Troponin I (High Sensitivity): 2 ng/L (ref <18)
Troponin I (High Sensitivity): 3 ng/L (ref <18)

## 2020-07-23 LAB — URINALYSIS, ROUTINE W REFLEX MICROSCOPIC
Bacteria, UA: NONE SEEN
Bilirubin Urine: NEGATIVE
Glucose, UA: NEGATIVE mg/dL
Ketones, ur: NEGATIVE mg/dL
Nitrite: NEGATIVE
Protein, ur: 30 mg/dL — AB
Specific Gravity, Urine: 1.013 (ref 1.005–1.030)
pH: 6 (ref 5.0–8.0)

## 2020-07-23 MED ORDER — ONDANSETRON 4 MG PO TBDP
4.0000 mg | ORAL_TABLET | Freq: Once | ORAL | Status: DC | PRN
  Filled 2020-07-23: qty 1

## 2020-07-23 MED ORDER — CEPHALEXIN 250 MG PO CAPS
250.0000 mg | ORAL_CAPSULE | Freq: Once | ORAL | Status: AC
  Administered 2020-07-23: 250 mg via ORAL
  Filled 2020-07-23: qty 1

## 2020-07-23 MED ORDER — SODIUM CHLORIDE 0.9 % IV BOLUS
1000.0000 mL | Freq: Once | INTRAVENOUS | Status: AC
  Administered 2020-07-23: 1000 mL via INTRAVENOUS

## 2020-07-23 MED ORDER — CEPHALEXIN 500 MG PO CAPS: 500.0000 mg | ORAL_CAPSULE | Freq: Three times a day (TID) | ORAL | 0 refills | Status: AC

## 2020-07-23 MED ORDER — ONDANSETRON HCL 4 MG/2ML IJ SOLN
4.0000 mg | Freq: Once | INTRAMUSCULAR | Status: AC
  Administered 2020-07-23: 4 mg via INTRAVENOUS
  Filled 2020-07-23: qty 2

## 2020-07-23 NOTE — Discharge Instructions (Signed)
Drink plenty of fluids to stay well-hydrated.  Take the antibiotics as prescribed.  Follow-up with your doctor and consider seeing a cardiologist for further evaluation of your syncopal episodes

## 2020-07-23 NOTE — ED Triage Notes (Signed)
Pt arrived via walk in, c/o syncope earlier this morning. States she has a hx of orthostatic hypotension, but changed positions slowly this morning. Pt denies any dizziness or feeling it coming on, states she was checking on pet, and next thing she remembered, her husband was helping her off kitchen floor.

## 2020-07-23 NOTE — ED Provider Notes (Signed)
[facility]-EMERGENCY DEPT Provider Note   CSN: [id number] Arrival date & time: 07/23/20  1227     History Chief Complaint  Patient presents with  . Loss of Consciousness    [name] is a 44 y.o. female.  HPI   Patient states she has a history of orthostatic hypotension and will often have episodes of dizziness when she changes positions but this morning she had a syncopal episode without really any warning.  Patient states she was checking on the pat and the next thing she remembers she had fallen to the floor.  Patient had to help her off the kitchen floor.  Since the fall and syncopal episode the patient has felt sore all over.  She is having pain in her neck and back.  She also has some pain across her chest.  Patient denies any breathing difficulties.  She denies any focal numbness or weakness but she does have persistent dizziness.  It is intermittent.  She is also having some episodes of nausea.  Patient recently has been treated for kidney stones and had a cystoscopy on October 1.  Past Medical History:  Diagnosis Date  . anxiety/panic attacks   . Bladder spasms   . Chicken pox   . Chronic constipation   . Chronic headaches    Occasional migraines  . Complication of anesthesia    03-03-2018 post-op sx, pt complain chest pain, testing negative and cardiologist consult, dr hochrien note in epic dated 05-21 and 03-04-2018 stating atypical chest pain, not cardiac  . Depression   . Dizziness   . Environmental allergies   . GERD (gastroesophageal reflux disease)    Prilosec for nausea, not GERD  . Gestational diabetes 07/08/2018  . Hemorrhoid   . History of kidney stones   . Hypokalemia, chronic   . Orthostatic hypotension    per pt get orthostatic with changes of positions quickly --- treatment w/ metoprolol and compression stockings  . Ovarian cyst   . Paroxysmal ventricular tachycardia (HCC)   . PONV (postoperative nausea and vomiting)   . PVC  (premature ventricular contraction)    intermittant PVC's , per pt symtomatic -- treated with metoprolol and potassium supplementation  . Renal insufficiency    secondary to sjogrens syndrome--- per pt followed by nephrologist in [place]  . Sjogren's syndrome (HCC)    rheumotologist-  per pt is in [place]  . SOB (shortness of breath)   . Syncope and collapse     Patient Active Problem List   Diagnosis Date Noted  . Chronic bilateral low back pain with bilateral sciatica 12/23/2018  . Renal calculus 07/17/2018  . Seasonal allergies 07/12/2018  . Vitamin D deficiency 07/12/2018  . Primary insomnia 07/12/2018  . GAD (generalized anxiety disorder) 07/12/2018  . Constipation 07/08/2018  . Sjogren's syndrome (HCC) 07/08/2018  . GERD (gastroesophageal reflux disease)     Past Surgical History:  Procedure Laterality Date  . ABDOMINAL HYSTERECTOMY     PARTIAL  . CESAREAN SECTION  x 3 LAST ONE 2012   W/ BILATERAL TUBLE LIGATION w/ last c/s  . CYSTOSCOPY W/ URETERAL STENT PLACEMENT Bilateral 06/22/2020   Procedure: CYSTOSCOPY WITH RETROGRADE PYELOGRAM/URETERAL STENT PLACEMENT;  Surgeon: Sebastian Ache, MD;  Location: [facility];  Service: Urology;  Laterality: Bilateral;  . CYSTOSCOPY WITH RETROGRADE PYELOGRAM, URETEROSCOPY AND STENT PLACEMENT Bilateral 07/14/2020   Procedure: CYSTOSCOPY WITH RETROGRADE PYELOGRAM, URETEROSCOPY AND STENT EXCHANGE, FIRST STAGE;  Surgeon: Sebastian Ache, MD;  Location: [facility];  Service:  Urology;  Laterality: Bilateral;  90 MINS  . CYSTOSCOPY WITH STENT PLACEMENT Bilateral 03/03/2018   Procedure: CYSTOSCOPY WITH BILATERAL STENT PLACEMENT;  Surgeon: Bjorn PippinWrenn, John, MD;  [facility];  Service: Urology;  Laterality: Bilateral;  . CYSTOSCOPY/URETEROSCOPY/HOLMIUM LASER/STENT PLACEMENT Bilateral 03/17/2018   Procedure: CYSTOSCOPY BILATERAL URETEROSCOPY/HOLMIUM LASER/STENT PLACEMENT;  Surgeon: Bjorn PippinWrenn, John, MD;  [facility];  Service: Urology;  Laterality:  Bilateral;  . EXPLORATORY LAPAROTOMY  2016ish  . EXTRACORPOREAL SHOCK WAVE LITHOTRIPSY Right 04/22/2019   Procedure: EXTRACORPOREAL SHOCK WAVE LITHOTRIPSY (ESWL);  Surgeon: Alfredo MartinezMacDiarmid, Scott, MD;  Location: [facility];  Service: Urology;  Laterality: Right;  . EXTRACORPOREAL SHOCK WAVE LITHOTRIPSY Right 05/03/2019   Procedure: EXTRACORPOREAL SHOCK WAVE LITHOTRIPSY (ESWL);  Surgeon: Bjorn PippinWrenn, John, MD;  Location: [facility];  Service: Urology;  Laterality: Right;  . HOLMIUM LASER APPLICATION Bilateral 07/14/2020   Procedure: HOLMIUM LASER APPLICATION;  Surgeon: Sebastian AcheManny, Theodore, MD;  Location: [facility];  Service: Urology;  Laterality: Bilateral;  . WISDOM TOOTH EXTRACTION       OB History   No obstetric history on file.     Family History  Problem Relation Age of Onset  . Breast cancer Mother   . Depression Mother   . Rheum arthritis Paternal Grandmother   . AAA (abdominal aortic aneurysm) Paternal Grandfather   . Heart disease Sister   . Lung cancer Maternal Grandfather     Social History   Tobacco Use  . Smoking status: Former Smoker    Years: 10.00    Types: Cigarettes    Quit date: 03/17/2007    Years since quitting: 13.3  . Smokeless tobacco: Never Used  Vaping Use  . Vaping Use: Never used  Substance Use Topics  . Alcohol use: Yes    Comment: Occasional  . Drug use: Never    Home Medications Prior to Admission medications   Medication Sig Start Date End Date Taking? Authorizing Provider  Cholecalciferol (VITAMIN D3) 5000 units CAPS Take 5,000 Units by mouth daily.   Yes [provider]  diphenhydrAMINE (BENADRYL) 50 MG capsule Take 50 mg by mouth at bedtime as needed for sleep.   Yes [provider]  gabapentin (NEURONTIN) 300 MG capsule TAKE 1 CAPSULE BY MOUTH THREE TIMES A DAY Patient taking differently: Take 300 mg by mouth 3 (three) times daily.  07/03/20  Yes Orland MustardWolfe, Allison, MD  GLUCOSAMINE-CHONDROITIN PO Take 1 tablet by mouth 2 (two) times daily.   Yes  [provider]  ketorolac (TORADOL) 10 MG tablet Take 1 tablet (10 mg total) by mouth every 6 (six) hours as needed for moderate pain. Or stent discomfort post-operatively. 07/14/20  Yes Sebastian AcheManny, Theodore, MD  L-METHIONINE PO Take 1 tablet by mouth in the morning and at bedtime. 500 mg per tablet   Yes [provider]  lidocaine (LIDODERM) 5 % Place 1 patch onto the skin daily as needed (pain). Remove & Discard patch within 12 hours or as directed by MD.   Yes [provider]  loratadine (CLARITIN) 10 MG tablet Take 10 mg by mouth every evening.    Yes [provider]  LORazepam (ATIVAN) 1 MG tablet Daily as needed Patient taking differently: Take 1 mg by mouth daily as needed for anxiety. Daily as needed 07/13/19  Yes Helane RimaWallace, Erica, DO  metoprolol succinate (TOPROL-XL) 25 MG 24 hr tablet Take 25 mg by mouth every morning.    Yes [provider]  mycophenolate (CELLCEPT) 500 MG tablet Take 500-1,000 mg by  mouth See admin instructions. Take 1000 mg by mouth in the morning and 500 mg in the evening   Yes [provider]  omeprazole (PRILOSEC) 20 MG capsule Take 20 mg by mouth every morning.    Yes [provider]  ondansetron (ZOFRAN) 8 MG tablet Take 1 tablet (8 mg total) by mouth every 8 (eight) hours as needed for nausea or vomiting. 04/19/19  Yes Helane Rima, DO  oxybutynin (DITROPAN) 5 MG tablet Take 1 tablet (5 mg total) by mouth every 8 (eight) hours as needed for bladder spasms. 07/14/20  Yes Sebastian Ache, MD  oxyCODONE-acetaminophen (PERCOCET) 5-325 MG tablet Take 1 tablet by mouth every 6 (six) hours as needed for severe pain. Post-operatively 07/14/20 07/14/21 Yes Sebastian Ache, MD  polyethylene glycol (MIRALAX) 17 g packet Take 17 g by mouth daily as needed for moderate constipation.    Yes [provider]  potassium chloride SA (KLOR-CON) 20 MEQ tablet Take 1 tablet (20 mEq total) by mouth 2 (two) times daily. 12/20/19   Yes Orland Mustard, MD  promethazine (PHENERGAN) 25 MG tablet Take 12.5 mg by mouth every 6 (six) hours as needed for nausea or vomiting. Took this am   Yes [provider]  spironolactone (ALDACTONE) 25 MG tablet Take 0.5 tablets (12.5 mg total) by mouth every morning. 06/21/20  Yes Orland Mustard, MD  traZODone (DESYREL) 100 MG tablet TAKE 1 TABLET BY MOUTH AT BEDTIME AS NEEDED FOR SLEEP Patient taking differently: Take 100 mg by mouth at bedtime.  02/07/20  Yes Orland Mustard, MD  TURMERIC CURCUMIN PO Take 1 capsule by mouth at bedtime.    Yes [provider]  cephALEXin (KEFLEX) 500 MG capsule Take 1 capsule (500 mg total) by mouth 3 (three) times daily for 5 days. 07/23/20 07/28/20  Linwood Dibbles, MD  oxybutynin (DITROPAN) 5 MG tablet Take 1 tablet (5 mg total) by mouth every 8 (eight) hours as needed for bladder spasms. 07/14/20   Sebastian Ache, MD  oxybutynin (DITROPAN) 5 MG tablet Take 1 tablet (5 mg total) by mouth every 8 (eight) hours as needed for bladder spasms. 07/14/20   Sebastian Ache, MD    Allergies    Cardizem [diltiazem hcl], Citalopram, Hydromorphone hcl, Lexapro [escitalopram oxalate], Norco [hydrocodone-acetaminophen], Paxil [paroxetine hcl], Prozac [fluoxetine], Zoloft [sertraline], Valium [diazepam], and Tape  Review of Systems   Review of Systems  All other systems reviewed and are negative.   Physical Exam Updated Vital Signs BP 122/74   Pulse 61   Temp 98 F (36.7 C) (Oral)   Resp 20   Ht 1.753 m ( )   Wt 95.3 kg   SpO2 96%   BMI 31.01 kg/m   Physical Exam Vitals and nursing note reviewed.  Constitutional:      General: She is not in acute distress.    Appearance: She is well-developed.  HENT:     Head: Normocephalic and atraumatic.     Right Ear: External ear normal.     Left Ear: External ear normal.  Eyes:     General: No scleral icterus.       Right eye: No discharge.        Left eye: No discharge.     Conjunctiva/sclera:  Conjunctivae normal.  Neck:     Trachea: No tracheal deviation.  Cardiovascular:     Rate and Rhythm: Normal rate and regular rhythm.  Pulmonary:     Effort: Pulmonary effort is normal. No respiratory distress.  Breath sounds: Normal breath sounds. No stridor. No wheezing or rales.  Abdominal:     General: Bowel sounds are normal. There is no distension.     Palpations: Abdomen is soft.     Tenderness: There is no abdominal tenderness. There is no guarding or rebound.  Musculoskeletal:     Cervical back: Neck supple. Tenderness present.     Thoracic back: Tenderness present.     Lumbar back: Tenderness present.     Comments: Full range of motion upper extremities and lower extremities, no deformities no tenderness  Skin:    General: Skin is warm and dry.     Findings: No rash.  Neurological:     Mental Status: She is alert.     Cranial Nerves: No cranial nerve deficit (no facial droop, extraocular movements intact, no slurred speech).     Sensory: No sensory deficit.     Motor: No abnormal muscle tone or seizure activity.     Coordination: Coordination normal.     Comments: Normal strength and sensation bilateral upper extremities and lower extremities     ED Results / Procedures / Treatments   Labs (all labs ordered are listed, but only abnormal results are displayed) Labs Reviewed  BASIC METABOLIC PANEL - Abnormal; Notable for the following components:      Result Value   CO2 21 (*)    Glucose, Bld 105 (*)    Creatinine, Ser 1.27 (*)    GFR, Estimated 52 (*)    All other components within normal limits  CBC - Abnormal; Notable for the following components:   WBC 3.3 (*)    All other components within normal limits  URINALYSIS, ROUTINE W REFLEX MICROSCOPIC - Abnormal; Notable for the following components:   Hgb urine dipstick MODERATE (*)    Protein, ur 30 (*)    Leukocytes,Ua LARGE (*)    All other components within normal limits  URINE CULTURE  TROPONIN I (HIGH  SENSITIVITY)  TROPONIN I (HIGH SENSITIVITY)    EKG EKG Interpretation  Date/Time:  Sunday July 23 2020 18:56:42 EDT Ventricular Rate:  79 PR Interval:    QRS Duration: 96 QT Interval:  360 QTC Calculation: 413 R Axis:   48 Text Interpretation: Sinus rhythm Borderline T abnormalities, diffuse leads No significant change since last tracing Confirmed by Linwood Dibbles [phone]) on 07/23/2020 9:12:08 PM   Radiology DG Chest 2 View  Result Date: 07/23/2020 CLINICAL DATA:  Fall, chest pain EXAM: CHEST - 2 VIEW COMPARISON:  None. FINDINGS: The heart size and mediastinal contours are within normal limits. Both lungs are clear. The visualized skeletal structures are unremarkable. IMPRESSION: No active cardiopulmonary disease. Electronically Signed   By: Helyn Numbers MD   On: 07/23/2020 19:58   DG Thoracic Spine 2 View  Result Date: 07/23/2020 CLINICAL DATA:  Fall, back pain EXAM: THORACIC SPINE 2 VIEWS COMPARISON:  None. FINDINGS: There is no evidence of thoracic spine fracture. Alignment is normal. No other significant bone abnormalities are identified. IMPRESSION: Negative. Electronically Signed   By: Helyn Numbers MD   On: 07/23/2020 19:58   DG Lumbar Spine Complete  Result Date: 07/23/2020 CLINICAL DATA:  Fall, back pain EXAM: LUMBAR SPINE - COMPLETE 4+ VIEW COMPARISON:  11/17/2018 FINDINGS: Five view radiograph of the lumbar spine demonstrates 5 non rib bearing segments of the lumbar spine. There is normal lumbar lordosis. No acute fracture or listhesis of the lumbar spine. Vertebral body height has been preserved. There is intervertebral disc  space narrowing and endplate remodeling with vacuum disc phenomena at L5-S1 in keeping with changes of at least moderate degenerative disc disease at this level. Remaining intervertebral disc heights are preserved. The paraspinal soft tissues are unremarkable. Bilateral ureteral stents are in place. Multiple punctate renal calculi are identified  bilaterally. Tubal ligation clips are noted within the right hemipelvis. IMPRESSION: No acute fracture or listhesis. Electronically Signed   By: Helyn Numbers MD   On: 07/23/2020 19:57   CT Head Wo Contrast  Result Date: 07/23/2020 CLINICAL DATA:  Fall, syncope, orthostatic EXAM: CT HEAD WITHOUT CONTRAST CT CERVICAL SPINE WITHOUT CONTRAST TECHNIQUE: Multidetector CT imaging of the head and cervical spine was performed following the standard protocol without intravenous contrast. Multiplanar CT image reconstructions of the cervical spine were also generated. COMPARISON:  None. FINDINGS: CT HEAD FINDINGS Brain: No evidence of acute infarction, hemorrhage, hydrocephalus, extra-axial collection, visible mass lesion or mass effect. Vascular: No hyperdense vessel or unexpected calcification. Skull: No significant scalp swelling or hematoma. No calvarial fracture or acute osseous abnormality. Sinuses/Orbits: Paranasal sinuses and mastoid air cells are predominantly clear. Included orbital structures are unremarkable. Other: None CT CERVICAL SPINE FINDINGS Alignment: Stabilization collar is absent at the time of examination. Straightening of normal cervical lordosis. Mild leftward cervical flexion. Minimal anterolisthesis C7 on T1 of approximately 3 mm favored to be on a spondylitic basis with degenerative change at this level. Skull base and vertebrae: No acute skull base fracture. No vertebral body fracture or height loss. Normal bone mineralization. No worrisome osseous lesions. Soft tissues and spinal canal: No pre or paravertebral fluid or swelling. No visible canal hematoma. Disc levels: Minimal intervertebral disc height loss most pronounced towards the lower cervical and upper thoracic spine. Slightly more pronounced disc osteophyte complex present C6-7 partially effaces the ventral thecal sac without significant canal impingement. Some uncinate spurring and facet hypertrophic changes are present maximally at  C7-T1 resulting in mild bilateral foraminal narrowing. No other significant canal or foraminal impingement. Upper chest: No acute abnormality in the upper chest or imaged lung apices. Other: Normal thyroid. IMPRESSION: 1. No acute intracranial abnormality. No significant scalp swelling or calvarial fracture. 2. No acute cervical spine fracture or traumatic listhesis. 3. Minimal cervical spondylitic and facet degenerative changes, as above. Electronically Signed   By: Kreg Shropshire M.D.   On: 07/23/2020 19:57   CT Cervical Spine Wo Contrast  Result Date: 07/23/2020 CLINICAL DATA:  Fall, syncope, orthostatic EXAM: CT HEAD WITHOUT CONTRAST CT CERVICAL SPINE WITHOUT CONTRAST TECHNIQUE: Multidetector CT imaging of the head and cervical spine was performed following the standard protocol without intravenous contrast. Multiplanar CT image reconstructions of the cervical spine were also generated. COMPARISON:  None. FINDINGS: CT HEAD FINDINGS Brain: No evidence of acute infarction, hemorrhage, hydrocephalus, extra-axial collection, visible mass lesion or mass effect. Vascular: No hyperdense vessel or unexpected calcification. Skull: No significant scalp swelling or hematoma. No calvarial fracture or acute osseous abnormality. Sinuses/Orbits: Paranasal sinuses and mastoid air cells are predominantly clear. Included orbital structures are unremarkable. Other: None CT CERVICAL SPINE FINDINGS Alignment: Stabilization collar is absent at the time of examination. Straightening of normal cervical lordosis. Mild leftward cervical flexion. Minimal anterolisthesis C7 on T1 of approximately 3 mm favored to be on a spondylitic basis with degenerative change at this level. Skull base and vertebrae: No acute skull base fracture. No vertebral body fracture or height loss. Normal bone mineralization. No worrisome osseous lesions. Soft tissues and spinal canal: No pre or paravertebral fluid  or swelling. No visible canal hematoma. Disc  levels: Minimal intervertebral disc height loss most pronounced towards the lower cervical and upper thoracic spine. Slightly more pronounced disc osteophyte complex present C6-7 partially effaces the ventral thecal sac without significant canal impingement. Some uncinate spurring and facet hypertrophic changes are present maximally at C7-T1 resulting in mild bilateral foraminal narrowing. No other significant canal or foraminal impingement. Upper chest: No acute abnormality in the upper chest or imaged lung apices. Other: Normal thyroid. IMPRESSION: 1. No acute intracranial abnormality. No significant scalp swelling or calvarial fracture. 2. No acute cervical spine fracture or traumatic listhesis. 3. Minimal cervical spondylitic and facet degenerative changes, as above. Electronically Signed   By: Kreg Shropshire M.D.   On: 07/23/2020 19:57    Procedures Procedures (including critical care time)  Medications Ordered in ED Medications  ondansetron (ZOFRAN-ODT) disintegrating tablet 4 mg (4 mg Oral Not Given 07/23/20 1756)  ondansetron (ZOFRAN) injection 4 mg (4 mg Intravenous Given 07/23/20 1954)  cephALEXin (KEFLEX) capsule 250 mg (250 mg Oral Given 07/23/20 1959)  sodium chloride 0.9 % bolus 1,000 mL (1,000 mLs Intravenous New Bag/Given 07/23/20 1956)    ED Course  I have reviewed the triage vital signs and the nursing notes.  Pertinent labs & imaging results that were available during my care of the patient were reviewed by me and considered in my medical decision making (see chart for details).  Clinical Course as of Jul 23 2149  Wynelle Link Jul 23, 2020  2149 X-rays reviewed.  No acute fractures or internal injuries   [JK]  2149 Laboratory test showed normal CBC.  Slight increase in creatinine compared to previous.  Patient was given a liter of fluid.  Urinalysis does suggest possible infection   [JK]    Clinical Course User Index [JK] Linwood Dibbles, MD   MDM Rules/Calculators/A&P                           Patient presented to the ED for evaluation of a syncopal episode.  Patient has history of orthostatic hypotension but her syncopal episode occurred without any warning.  Patient states she has had prior evaluations for syncopal episode when she was living in a different area.  Patient indicates she has had an echocardiogram in the past.  Patient also has worn cardiac monitors.  Patient did complain of some chest discomfort as well as headache and back pain after her event.  Cardiac work-up is reassuring.  Serial troponins are normal.  Symptoms are not suggestive of pulmonary embolism or acute coronary syndrome.  Patient has been dealing with ureteral stents and her urinalysis does suggest possible infection.  I will send off a culture and start her on a course of antibiotics.  Recommend outpatient cardiology follow-up. Final Clinical Impression(s) / ED Diagnoses Final diagnoses:  Syncope and collapse  Acute cystitis with hematuria    Rx / DC Orders ED Discharge Orders         Ordered    cephALEXin (KEFLEX) 500 MG capsule  3 times daily        07/23/20 2147           Linwood Dibbles, MD 07/23/20 2150

## 2020-07-24 LAB — URINE CULTURE: Culture: <10000 — AB

## 2020-07-27 ENCOUNTER — Other Ambulatory Visit: Payer: Self-pay | Admitting: Urology

## 2020-07-27 NOTE — Progress Notes (Signed)
COVID Vaccine Completed:  X2  Date COVID Vaccine completed:12-01-19 & 12-23-19 COVID vaccine manufacturer: Pfizer    Moderna   [employer]   PCP - Jessica Livengood, PA-C Cardiologist - Not recently  Chest x-ray - 07-23-20 in Epic EKG - 07-24-20 in Epic Stress Test - 5+ years ago ECHO - 07-13-19 in Epic Cardiac Cath -  Pacemaker/ICD device last checked:  Sleep Study - 5+ years ago, negative CPAP -   Fasting Blood Sugar -  Checks Blood Sugar _____ times a day  Blood Thinner Instructions: Aspirin Instructions: Last Dose:  Anesthesia review:  Recent syncopal episode 07/23/20, seen in ER.  Hx of palpitations and tachycardia, takes Metoprolol for palpitations not HTN.  Patient denies shortness of breath, fever, cough and chest pain at PAT appointment   Patient verbalized understanding of instructions that were given to them at the PAT appointment. Patient was also instructed that they will need to review over the PAT instructions again at home before surgery. 

## 2020-07-27 NOTE — Patient Instructions (Signed)
DUE TO COVID-19 ONLY ONE VISITOR IS ALLOWED TO COME WITH YOU AND STAY IN THE WAITING ROOM ONLY  DURING PRE OP AND PROCEDURE.   IF YOU WILL BE ADMITTED INTO THE HOSPITAL YOU ARE ALLOWED ONE SUPPORT PERSON DURING VISITATION  HOURS ONLY (10AM -8PM)   . The support person may change daily. . The support person must pass our screening, gel in and out, and wear a mask at all times, including in the patient's room. . Patients must also wear a mask when staff or their support person are in the room.   COVID SWAB TESTING MUST BE COMPLETED ON:   Tuesday, 08-01-20 @   [address] [place], [place] [address]  (Must self quarantine after testing. Follow instructions on  handout.)   Your procedure is scheduled on:  Friday, 08-04-20   Report to [facility] Main  Entrance    Report to admitting at 1:30 PM   Call this number if you have problems the morning of surgery [phone]   Do not eat food :After Midnight.   May have liquids until 12:30 PM day of surgery  CLEAR LIQUID DIET  Foods Allowed                                                                     Foods Excluded  Water, Black Coffee and tea, regular and decaf              liquids that you cannot  Plain Jell-O in any flavor  (No red)                                    see through such as: Fruit ices (not with fruit pulp)                                      milk, soups, orange juice              Iced Popsicles (No red)                                      All solid food                                   Apple juices Sports drinks like Gatorade (No red) Lightly seasoned clear broth or consume(fat free) Sugar, honey syrup     Oral Hygiene is also important to reduce your risk of infection.                                    Remember - BRUSH YOUR TEETH THE MORNING OF SURGERY WITH YOUR REGULAR TOOTHPASTE   Do NOT smoke after Midnight   Take these medicines the morning of surgery with A SIP OF WATER: Gabapentin,  Metoprolol, Omeprazole  You may not have any metal on your body including hair pins, jewelry, and body piercings             Do not wear make-up, lotions, powders, perfumes/cologne, or deodorant             Do not wear nail polish.  Do not shave  48 hours prior to surgery.     Do not bring valuables to the hospital. [facility] IS NOT RESPONSIBLE   FOR VALUABLES.   Contacts, dentures or bridgework may not be worn into surgery.    Patients discharged the day of surgery will not be allowed to drive home.                Please read over the following fact sheets you were given: IF YOU HAVE QUESTIONS ABOUT YOUR PRE OP INSTRUCTIONS PLEASE CALL [phone]   [facility] - Preparing for Surgery Before surgery, you can play an important role.  Because skin is not sterile, your skin needs to be as free of germs as possible.  You can reduce the number of germs on your skin by washing with CHG (chlorahexidine gluconate) soap before surgery.  CHG is an antiseptic cleaner which kills germs and bonds with the skin to continue killing germs even after washing. Please DO NOT use if you have an allergy to CHG or antibacterial soaps.  If your skin becomes reddened/irritated stop using the CHG and inform your nurse when you arrive at Short Stay. Do not shave (including legs and underarms) for at least 48 hours prior to the first CHG shower.  You may shave your face/neck.  Please follow these instructions carefully:  1.  Shower with CHG Soap the night before surgery and the  morning of surgery.  2.  If you choose to wash your hair, wash your hair first as usual with your normal  shampoo.  3.  After you shampoo, rinse your hair and body thoroughly to remove the shampoo.                             4.  Use CHG as you would any other liquid soap.  You can apply chg directly to the skin and wash.  Gently with a scrungie or clean washcloth.  5.  Apply the CHG Soap to your body ONLY  FROM THE NECK DOWN.   Do   not use on face/ open                           Wound or open sores. Avoid contact with eyes, ears mouth and   genitals (private parts).                       Wash face,  Genitals (private parts) with your normal soap.             6.  Wash thoroughly, paying special attention to the area where your    surgery  will be performed.  7.  Thoroughly rinse your body with warm water from the neck down.  8.  DO NOT shower/wash with your normal soap after using and rinsing off the CHG Soap.                9.  Pat yourself dry with a clean towel.            10.  Wear clean pajamas.            11.  Place clean sheets on your bed the night of your first shower and do not  sleep with pets. Day of Surgery : Do not apply any lotions/deodorants the morning of surgery.  Please wear clean clothes to the hospital/surgery center.  FAILURE TO FOLLOW THESE INSTRUCTIONS MAY RESULT IN THE CANCELLATION OF YOUR SURGERY  PATIENT SIGNATURE_________________________________  NURSE SIGNATURE__________________________________  ________________________________________________________________________

## 2020-07-28 ENCOUNTER — Encounter (HOSPITAL_COMMUNITY)
Admission: RE | Admit: 2020-07-28 | Discharge: 2020-07-28 | Disposition: A | Discharge status: Home / Self Care | Payer: Commercial Managed Care - PPO | Source: Ambulatory Visit | Attending: Urology | Admitting: Urology

## 2020-07-28 ENCOUNTER — Other Ambulatory Visit: Payer: Self-pay

## 2020-07-28 ENCOUNTER — Encounter (HOSPITAL_COMMUNITY): Payer: Self-pay

## 2020-07-28 DIAGNOSIS — Z01818 Encounter for other preprocedural examination: Secondary | ICD-10-CM | POA: Insufficient documentation

## 2020-07-28 NOTE — Progress Notes (Addendum)
COVID Vaccine Completed:  X2  Date COVID Vaccine completed:12-01-19 & 12-23-19 COVID vaccine manufacturer: Pfizer    Moderna   [employer]   PCP - Ernst Bowler, PA-C Cardiologist - Not recently  Chest x-ray - 07-23-20 in Epic EKG - 07-24-20 in Epic Stress Test - 5+ years ago ECHO - 07-13-19 in Epic Cardiac Cath -  Pacemaker/ICD device last checked:  Sleep Study - 5+ years ago, negative CPAP -   Fasting Blood Sugar -  Checks Blood Sugar _____ times a day  Blood Thinner Instructions: Aspirin Instructions: Last Dose:  Anesthesia review:  Recent syncopal episode 07/23/20, seen in ER.  Hx of palpitations and tachycardia, takes Metoprolol for palpitations not HTN.  Patient denies shortness of breath, fever, cough and chest pain at PAT appointment   Patient verbalized understanding of instructions that were given to them at the PAT appointment. Patient was also instructed that they will need to review over the PAT instructions again at home before surgery.

## 2020-08-02 ENCOUNTER — Other Ambulatory Visit (HOSPITAL_COMMUNITY): Payer: Commercial Managed Care - PPO

## 2020-08-03 ENCOUNTER — Other Ambulatory Visit (HOSPITAL_COMMUNITY)
Admission: RE | Admit: 2020-08-03 | Discharge: 2020-08-03 | Disposition: A | Discharge status: Home / Self Care | Payer: Commercial Managed Care - PPO | Source: Ambulatory Visit | Attending: Urology | Admitting: Urology

## 2020-08-03 DIAGNOSIS — Z01812 Encounter for preprocedural laboratory examination: Secondary | ICD-10-CM | POA: Diagnosis present

## 2020-08-03 DIAGNOSIS — Z20822 Contact with and (suspected) exposure to covid-19: Secondary | ICD-10-CM | POA: Diagnosis not present

## 2020-08-03 LAB — SARS CORONAVIRUS 2 (TAT 6-24 HRS): SARS Coronavirus 2: NEGATIVE

## 2020-08-04 ENCOUNTER — Ambulatory Visit (HOSPITAL_COMMUNITY): Payer: Commercial Managed Care - PPO | Admitting: Physician Assistant

## 2020-08-04 ENCOUNTER — Encounter (HOSPITAL_COMMUNITY)
Admission: RE | Disposition: A | Discharge status: Home / Self Care | Payer: Self-pay | Source: Home / Self Care | Attending: Urology

## 2020-08-04 ENCOUNTER — Ambulatory Visit (HOSPITAL_COMMUNITY)
Admission: RE | Admit: 2020-08-04 | Discharge: 2020-08-04 | Disposition: A | Discharge status: Home / Self Care | Payer: Commercial Managed Care - PPO | Attending: Urology | Admitting: Urology

## 2020-08-04 ENCOUNTER — Ambulatory Visit (HOSPITAL_COMMUNITY): Payer: Commercial Managed Care - PPO

## 2020-08-04 ENCOUNTER — Encounter (HOSPITAL_COMMUNITY): Payer: Self-pay | Admitting: Urology

## 2020-08-04 DIAGNOSIS — F419 Anxiety disorder, unspecified: Secondary | ICD-10-CM | POA: Insufficient documentation

## 2020-08-04 DIAGNOSIS — M3500 Sicca syndrome, unspecified: Secondary | ICD-10-CM | POA: Insufficient documentation

## 2020-08-04 DIAGNOSIS — Z79899 Other long term (current) drug therapy: Secondary | ICD-10-CM | POA: Diagnosis not present

## 2020-08-04 DIAGNOSIS — E669 Obesity, unspecified: Secondary | ICD-10-CM | POA: Diagnosis not present

## 2020-08-04 DIAGNOSIS — Z87891 Personal history of nicotine dependence: Secondary | ICD-10-CM | POA: Diagnosis not present

## 2020-08-04 DIAGNOSIS — N200 Calculus of kidney: Secondary | ICD-10-CM | POA: Insufficient documentation

## 2020-08-04 DIAGNOSIS — I493 Ventricular premature depolarization: Secondary | ICD-10-CM | POA: Diagnosis not present

## 2020-08-04 DIAGNOSIS — K219 Gastro-esophageal reflux disease without esophagitis: Secondary | ICD-10-CM | POA: Insufficient documentation

## 2020-08-04 DIAGNOSIS — Z888 Allergy status to other drugs, medicaments and biological substances status: Secondary | ICD-10-CM | POA: Insufficient documentation

## 2020-08-04 DIAGNOSIS — Z6830 Body mass index (BMI) 30.0-30.9, adult: Secondary | ICD-10-CM | POA: Diagnosis not present

## 2020-08-04 DIAGNOSIS — Z87442 Personal history of urinary calculi: Secondary | ICD-10-CM | POA: Insufficient documentation

## 2020-08-04 DIAGNOSIS — Z885 Allergy status to narcotic agent status: Secondary | ICD-10-CM | POA: Insufficient documentation

## 2020-08-04 DIAGNOSIS — N289 Disorder of kidney and ureter, unspecified: Secondary | ICD-10-CM | POA: Diagnosis not present

## 2020-08-04 HISTORY — PX: CYSTOSCOPY WITH RETROGRADE PYELOGRAM, URETEROSCOPY AND STENT PLACEMENT: SHX5789

## 2020-08-04 SURGERY — CYSTOURETEROSCOPY, WITH RETROGRADE PYELOGRAM AND STENT INSERTION
Anesthesia: General | Laterality: Bilateral

## 2020-08-04 MED ORDER — PROPOFOL 10 MG/ML IV BOLUS
INTRAVENOUS | Status: DC | PRN
  Administered 2020-08-04: 200 mg via INTRAVENOUS

## 2020-08-04 MED ORDER — PROPOFOL 10 MG/ML IV BOLUS
INTRAVENOUS | Status: AC
  Filled 2020-08-04: qty 20

## 2020-08-04 MED ORDER — IOHEXOL 300 MG/ML  SOLN
INTRAMUSCULAR | Status: DC | PRN
  Administered 2020-08-04: 30 mL

## 2020-08-04 MED ORDER — KETOROLAC TROMETHAMINE 10 MG PO TABS: 10.0000 mg | ORAL_TABLET | Freq: Three times a day (TID) | ORAL | 0 refills | Status: DC | PRN

## 2020-08-04 MED ORDER — LACTATED RINGERS IV SOLN: INTRAVENOUS | Status: DC

## 2020-08-04 MED ORDER — PROMETHAZINE HCL 25 MG/ML IJ SOLN: 6.2500 mg | INTRAMUSCULAR | Status: DC | PRN

## 2020-08-04 MED ORDER — CHLORHEXIDINE GLUCONATE 0.12 % MT SOLN
15.0000 mL | Freq: Once | OROMUCOSAL | Status: AC
  Administered 2020-08-04: 15 mL via OROMUCOSAL

## 2020-08-04 MED ORDER — FENTANYL CITRATE (PF) 100 MCG/2ML IJ SOLN: 25.0000 ug | INTRAMUSCULAR | Status: DC | PRN

## 2020-08-04 MED ORDER — SCOPOLAMINE 1 MG/3DAYS TD PT72
MEDICATED_PATCH | TRANSDERMAL | Status: DC | PRN
  Administered 2020-08-04: 1 via TRANSDERMAL

## 2020-08-04 MED ORDER — DEXAMETHASONE SODIUM PHOSPHATE 10 MG/ML IJ SOLN
INTRAMUSCULAR | Status: DC | PRN
  Administered 2020-08-04: 10 mg via INTRAVENOUS

## 2020-08-04 MED ORDER — MIDAZOLAM HCL 2 MG/2ML IJ SOLN
INTRAMUSCULAR | Status: AC
  Filled 2020-08-04: qty 2

## 2020-08-04 MED ORDER — SODIUM CHLORIDE 0.9 % IR SOLN
Status: DC | PRN
  Administered 2020-08-04: 3000 mL

## 2020-08-04 MED ORDER — PHENYLEPHRINE HCL-NACL 10-0.9 MG/250ML-% IV SOLN
INTRAVENOUS | Status: DC | PRN
  Administered 2020-08-04: 25 ug/min via INTRAVENOUS

## 2020-08-04 MED ORDER — FENTANYL CITRATE (PF) 100 MCG/2ML IJ SOLN
INTRAMUSCULAR | Status: AC
  Filled 2020-08-04: qty 2

## 2020-08-04 MED ORDER — FENTANYL CITRATE (PF) 100 MCG/2ML IJ SOLN
INTRAMUSCULAR | Status: DC | PRN
Start: 2020-08-04 — End: 2020-08-04
  Administered 2020-08-04 (×3): 50 ug via INTRAVENOUS

## 2020-08-04 MED ORDER — ONDANSETRON HCL 4 MG/2ML IJ SOLN
INTRAMUSCULAR | Status: AC
  Filled 2020-08-04: qty 2

## 2020-08-04 MED ORDER — NITROFURANTOIN MONOHYD MACRO 100 MG PO CAPS: 100.0000 mg | ORAL_CAPSULE | Freq: Two times a day (BID) | ORAL | 0 refills | Status: AC

## 2020-08-04 MED ORDER — ONDANSETRON HCL 4 MG/2ML IJ SOLN
INTRAMUSCULAR | Status: DC | PRN
  Administered 2020-08-04: 4 mg via INTRAVENOUS

## 2020-08-04 MED ORDER — ORAL CARE MOUTH RINSE: 15.0000 mL | Freq: Once | OROMUCOSAL | Status: AC

## 2020-08-04 MED ORDER — DEXAMETHASONE SODIUM PHOSPHATE 10 MG/ML IJ SOLN
INTRAMUSCULAR | Status: AC
  Filled 2020-08-04: qty 1

## 2020-08-04 MED ORDER — SCOPOLAMINE 1 MG/3DAYS TD PT72
MEDICATED_PATCH | TRANSDERMAL | Status: AC
  Filled 2020-08-04: qty 1

## 2020-08-04 MED ORDER — MIDAZOLAM HCL 2 MG/2ML IJ SOLN
INTRAMUSCULAR | Status: DC | PRN
  Administered 2020-08-04 (×2): 1 mg via INTRAVENOUS

## 2020-08-04 MED ORDER — GENTAMICIN SULFATE 40 MG/ML IJ SOLN
5.0000 mg/kg | INTRAVENOUS | Status: AC
  Administered 2020-08-04: 380 mg via INTRAVENOUS
  Filled 2020-08-04: qty 9.5

## 2020-08-04 MED ORDER — LIDOCAINE 2% (20 MG/ML) 5 ML SYRINGE
INTRAMUSCULAR | Status: DC | PRN
  Administered 2020-08-04: 100 mg via INTRAVENOUS

## 2020-08-04 MED ORDER — LIDOCAINE 2% (20 MG/ML) 5 ML SYRINGE
INTRAMUSCULAR | Status: AC
  Filled 2020-08-04: qty 5

## 2020-08-04 MED ORDER — OXYCODONE-ACETAMINOPHEN 5-325 MG PO TABS
1.0000 | ORAL_TABLET | Freq: Four times a day (QID) | ORAL | 0 refills | Status: AC | PRN
Start: 2020-08-04 — End: 2021-08-04

## 2020-08-04 MED ORDER — MEPERIDINE HCL 50 MG/ML IJ SOLN: 6.2500 mg | INTRAMUSCULAR | Status: DC | PRN

## 2020-08-04 SURGICAL SUPPLY — 23 items
BAG URO CATCHER STRL LF (MISCELLANEOUS) ×2 IMPLANT
BASKET LASER NITINOL 1.9FR (BASKET) IMPLANT
BASKET STONE NCOMPASS (UROLOGICAL SUPPLIES) ×2 IMPLANT
BSKT STON RTRVL 120 1.9FR (BASKET)
CATH INTERMIT  6FR 70CM (CATHETERS) ×2 IMPLANT
CLOTH BEACON ORANGE TIMEOUT ST (SAFETY) ×2 IMPLANT
EXTRACTOR STONE 1.7FRX115CM (UROLOGICAL SUPPLIES) IMPLANT
GLOVE BIOGEL M STRL SZ7.5 (GLOVE) ×2 IMPLANT
GOWN STRL REUS W/TWL LRG LVL3 (GOWN DISPOSABLE) ×2 IMPLANT
GUIDEWIRE ANG ZIPWIRE 038X150 (WIRE) ×4 IMPLANT
GUIDEWIRE STR DUAL SENSOR (WIRE) ×4 IMPLANT
KIT TURNOVER KIT A (KITS) IMPLANT
LASER FIB FLEXIVA PULSE ID 365 (Laser) IMPLANT
MANIFOLD NEPTUNE II (INSTRUMENTS) ×2 IMPLANT
PACK CYSTO (CUSTOM PROCEDURE TRAY) ×2 IMPLANT
SHEATH URETERAL 12FRX28CM (UROLOGICAL SUPPLIES) ×2 IMPLANT
SHEATH URETERAL 12FRX35CM (MISCELLANEOUS) IMPLANT
STENT POLARIS 5FRX24 (STENTS) ×4 IMPLANT
TRACTIP FLEXIVA PULS ID 200XHI (Laser) IMPLANT
TRACTIP FLEXIVA PULSE ID 200 (Laser)
TUBE FEEDING 8FR 16IN STR KANG (MISCELLANEOUS) ×2 IMPLANT
TUBING CONNECTING 10 (TUBING) ×2 IMPLANT
TUBING UROLOGY SET (TUBING) ×2 IMPLANT

## 2020-08-04 NOTE — Anesthesia Procedure Notes (Signed)
Date/Time: 08/04/2020 4:57 PM Performed by: Minerva Ends, CRNA Oxygen Delivery Method: Simple face mask Placement Confirmation: positive ETCO2 and breath sounds checked- equal and bilateral Dental Injury: Teeth and Oropharynx as per pre-operative assessment

## 2020-08-04 NOTE — Brief Op Note (Signed)
08/04/2020  4:51 PM  PATIENT:  [name]  44 y.o. female  PRE-OPERATIVE DIAGNOSIS:  RESIDUAL BILATERAL STONES  POST-OPERATIVE DIAGNOSIS:  RESIDUAL BILATERAL STONES  PROCEDURE:  Procedure(s) with comments: CYSTOSCOPY WITH RETROGRADE PYELOGRAM, URETEROSCOPY WITH BASKETRY OF STONE AND STENT EXCHANGE (Bilateral) - 75 MINS  SURGEON:  Surgeon(s) and Role:    * Sebastian Ache, MD - Primary  PHYSICIAN ASSISTANT:   ASSISTANTS: none   ANESTHESIA:   general  EBL:  0 mL   BLOOD ADMINISTERED:none  DRAINS: none   LOCAL MEDICATIONS USED:  NONE  SPECIMEN:  Source of Specimen:  bilateral residual renal stone fragments  DISPOSITION OF SPECIMEN:  discard  COUNTS:  YES  TOURNIQUET:  * No tourniquets in log *  DICTATION: .Other Dictation: Dictation Number [id number]  PLAN OF CARE: Discharge to home after PACU  PATIENT DISPOSITION:  PACU - hemodynamically stable.   Delay start of Pharmacological VTE agent (>24hrs) due to surgical blood loss or risk of bleeding: yes

## 2020-08-04 NOTE — Anesthesia Preprocedure Evaluation (Addendum)
Anesthesia Evaluation  Patient identified by MRN, date of birth, ID band Patient awake    History of Anesthesia Complications (+) PONV and history of anesthetic complications  Airway Mallampati: II  TM Distance: >3 FB Neck ROM: Full    Dental  (+) Teeth Intact   Pulmonary former smoker,    Pulmonary exam normal        Cardiovascular  Rhythm:Regular Rate:Normal  PVCs   Neuro/Psych  Headaches, Anxiety Depression    GI/Hepatic Neg liver ROS, GERD  ,  Endo/Other  diabetes  Renal/GU Renal diseaseSjogren's syndrome related   Renal stones    Musculoskeletal Sjogren's syndrome   Abdominal (+)  Abdomen: soft. Bowel sounds: normal.  Peds  Hematology   Anesthesia Other Findings   Reproductive/Obstetrics                             Anesthesia Physical  Anesthesia Plan  ASA: II  Anesthesia Plan: General   Post-op Pain Management:    Induction:   PONV Risk Score and Plan: 4 or greater and Propofol infusion, Ondansetron, Dexamethasone, Treatment may vary due to age or medical condition, Midazolam and Scopolamine patch - Pre-op  Airway Management Planned: LMA  Additional Equipment: None  Intra-op Plan:   Post-operative Plan: Extubation in OR  Informed Consent: I have reviewed the patients History and Physical, chart, labs and discussed the procedure including the risks, benefits and alternatives for the proposed anesthesia with the patient or authorized representative who has indicated his/her understanding and acceptance.     Dental advisory given  Plan Discussed with: CRNA  Anesthesia Plan Comments:       Anesthesia Quick Evaluation

## 2020-08-04 NOTE — Anesthesia Procedure Notes (Signed)
Procedure Name: LMA Insertion Date/Time: 08/04/2020 4:13 PM Performed by: Minerva Ends, CRNA Pre-anesthesia Checklist: Patient identified, Emergency Drugs available, Suction available and Patient being monitored Patient Re-evaluated:Patient Re-evaluated prior to induction Oxygen Delivery Method: Circle System Utilized Preoxygenation: Pre-oxygenation with 100% oxygen Induction Type: IV induction Ventilation: Mask ventilation without difficulty LMA: LMA inserted and LMA with gastric port inserted LMA Size: 4.0 Number of attempts: 1 Placement Confirmation: positive ETCO2 Tube secured with: Tape Dental Injury: Teeth and Oropharynx as per pre-operative assessment  Comments: Smooth IV induction germeroth-- AM CRNA LMA insertion atraumatic-- teeth and mouth as preop-- small chip on left lateral tooth-- unchanged with LMA insertion-- bilat BS

## 2020-08-04 NOTE — Op Note (Signed)
[name], [name] MEDICAL RECORD OZ:[id number] ACCOUNT [id number] DATE OF BIRTH:10-01-1976 FACILITY: [facility] LOCATION: [facility] PHYSICIAN:Shaylinn Hladik Berneice Heinrich, MD  OPERATIVE REPORT  DATE OF PROCEDURE:  08/04/2020  SURGEON:  Sebastian Ache, MD  PREOPERATIVE DIAGNOSIS:  Residual bilateral renal stones.  PROCEDURE: 1.  Cystoscopy, bilateral retrograde pyelograms, interpretation. 2.  Bilateral ureteroscopy with basketing of stones, second-stage. 3.  Exchange of bilateral ureteral stents, 5 x 24 Polaris with tether.  ESTIMATED BLOOD LOSS:  Nil.  MEDICATIONS:  None.  SPECIMEN:  Bilateral renal stone fragments for discard.  FINDINGS: 1.  Significant interval passage of large volume of small stone fragments with stenting. 2.  Approximately 5 mm2 residual stone volume in each kidney. 3.  Complete resolution of all accessible stone fragments larger than one-third mm following basket extraction. 4.  Successful replacement of bilateral ureteral stents, proximal end in the renal pelvis, distal end in urinary bladder.  INDICATIONS:  The patient is a 44 year old lady with longstanding history of recurrent urolithiasis.  She had a high risk episode in 06/2020 where she had bilateral ureteral stones and acute renal failure.  She underwent stenting at that time as a  temporizing measure.  She has significant stone volume in each side, approximately 1.5 cm each.  She underwent first-stage ureteroscopy on 07/14/2020 where it was felt that probably 50% to 70% of stone volume has been addressed initially using a dusting  technique.  Given her large volume stone and high risk of stone formation, that a second-stage procedure would be advantageous to render her stone free.  She presents for this today.  Informed consent was obtained and placed in the medical record.  DESCRIPTION OF PROCEDURE:  The patient being identified, the procedure being second-stage bilateral ureteroscopy was confirmed.  Procedure  timeout was performed.  Intravenous antibiotics administered.  General anesthesia introduced.  The patient was  placed into a low lithotomy position.  Sterile field was created, prepping and draping the vagina, introitus, and proximal thighs using iodine.  Cystourethroscopy was performed using 21-French rigid cystoscope with offset lens.  Inspection of bladder  revealed distal end of bilateral stents in situ.  There were no papillary lesions or calcifications noted.  Distal end of the left stent was grasped, brought to the level of the urethral meatus.  A 0.03 ZIPwire was advanced to lower pole and the stent  was exchanged for an open-ended catheter and left retrograde pyelogram was obtained.  Left retrograde pyelogram demonstrated a single left ureter, single system left kidney.  No obvious filling defects or narrowing noted.  The ZIPwire was once again advanced and set aside as a safety wire.  Similarly, the distal end of right ureteral  stent was grasped, brought to the level of the urethral meatus and a 0.03 ZIPwire was advanced to lower pole and the stent was exchanged for open-ended catheter and right retrograde pyelogram was obtained.  Right retrograde pyelogram demonstrated a single right ureter, single system right kidney.  No filling defects or narrowing noted. A ZIPwire was once again advanced and set aside as a safety wire.  An 8-French feeding tube was placed in the urinary  bladder for pressure release.  Semirigid ureteroscopy was performed of the distal four-fifths of the right ureter alongside a separate sensor working wire.  No mucosal abnormalities were found.  Next, semirigid ureteroscopy was performed in the distal  four-fifths of the left ureter alongside a separate sensor working wire.  No mucosal abnormalities were found.  The semirigid scope was then exchanged  for a 12/14 short length ureteral access sheath to the level of the proximal ureter using continuous  fluoroscopic guidance  and flexible digital ureteroscopy performed of the proximal left ureter and systematic inspection left kidney, including all calices x3.  There was interval passage of a very significant amount of her residual stone volume.  There  was only approximately a 5 mm2 total stone volume of present, mostly in mid and lower poles.  These were amenable to simple basketing with an NCompass-type basket.  These fragments were removed and set aside for discard as we already had a recent  composition done on her.  The  access sheath was removed under continuous vision.  No mucosal abnormalities were found.  Next, the access sheath was placed over the right sensor working wire to the level of the proximal right ureter and flexible digital  ureteroscopy performed of the proximal right ureter and systematic inspection of the right kidney, including all calices x3.  Similarly, there was interval passage of the vast majority of her stone volume.  There was only about 5 mm2 residual.  This was  amenable to simple basketing with an NCompass basket and these fragments were removed and set aside for discard.  Access sheath was removed under continuous vision.  No mucosal masses were found.  Given the bilateral nature of the procedure, it was felt  that brief interval stenting with tethered stents would be warranted.  As such, new 5 x 24 Polaris-type stents were placed over the safety wires bilaterally using fluoroscopic guidance.  Good proximal and distal planes were noted.  Tether was left in  place and tucked per vagina and the procedure as terminated.  The patient tolerated the procedure well.  No immediate perioperative complications.  The patient was taken to postanesthesia care unit in stable condition with plan for discharge home.  VN/NUANCE  D:08/04/2020 T:08/04/2020 JOB:[id number]

## 2020-08-04 NOTE — H&P (Signed)
[name] is an 44 y.o. female.    Chief Complaint: Pre-OP 2nd Stage BILATERAL Ureteroscopic Stone Maniulation  HPI:   1 - Recurrent Urolithiasis -  Pre 2021 SWL, MET, URS x many  06/2020 - bilateral ureteral and renal stones (total vol 1.2cm each side), bilateral strents placed as ARF with Cr 1.7  10/1/221 - bilseral 2st stage ureteroscopy and stent exchange.   PMH sig for obesity, Sjogren's, TAH (benign). Her PCP is J [employer] PA with [employer].   Today "[name]" is seen to proceed with BILATERAL ureteroscopic stone manipulation. C19 screen negative. UCX non-clonal   Past Medical History:  Diagnosis Date  . anxiety/panic attacks   . Bladder spasms   . Chicken pox   . Chronic constipation   . Chronic headaches    Occasional migraines  . Complication of anesthesia    03-03-2018 post-op sx, pt complain chest pain, testing negative and cardiologist consult, dr hochrien note in epic dated 05-21 and 03-04-2018 stating atypical chest pain, not cardiac  . Depression   . Dizziness   . Environmental allergies   . GERD (gastroesophageal reflux disease)    Prilosec for nausea, not GERD  . Gestational diabetes 07/08/2018  . Hemorrhoid   . History of kidney stones   . Hypokalemia, chronic   . Orthostatic hypotension    per pt get orthostatic with changes of positions quickly --- treatment w/ metoprolol and compression stockings  . Ovarian cyst   . PONV (postoperative nausea and vomiting)   . PVC (premature ventricular contraction)    intermittant PVC's , per pt symtomatic -- treated with metoprolol and potassium supplementation  . Renal insufficiency    secondary to sjogrens syndrome--- per pt followed by nephrologist in [place]  . Sjogren's syndrome ([place])    rheumotologist-  per pt is in [place]  . SOB (shortness of breath)   . Syncope and collapse     Past Surgical History:  Procedure Laterality Date  . ABDOMINAL HYSTERECTOMY     PARTIAL  . CESAREAN SECTION  x 3 LAST ONE 2012   W/  BILATERAL TUBLE LIGATION w/ last c/s  . CYSTOSCOPY W/ URETERAL STENT PLACEMENT Bilateral 06/22/2020   Procedure: CYSTOSCOPY WITH RETROGRADE PYELOGRAM/URETERAL STENT PLACEMENT;  Surgeon: Alexis Frock, MD;  Location: [facility];  Service: Urology;  Laterality: Bilateral;  . CYSTOSCOPY WITH RETROGRADE PYELOGRAM, URETEROSCOPY AND STENT PLACEMENT Bilateral 07/14/2020   Procedure: CYSTOSCOPY WITH RETROGRADE PYELOGRAM, URETEROSCOPY AND STENT EXCHANGE, FIRST STAGE;  Surgeon: Alexis Frock, MD;  Location: [facility];  Service: Urology;  Laterality: Bilateral;  90 MINS  . CYSTOSCOPY WITH STENT PLACEMENT Bilateral 03/03/2018   Procedure: CYSTOSCOPY WITH BILATERAL STENT PLACEMENT;  Surgeon: Irine Seal, MD;  Location: [facility];  Service: Urology;  Laterality: Bilateral;  . CYSTOSCOPY/URETEROSCOPY/HOLMIUM LASER/STENT PLACEMENT Bilateral 03/17/2018   Procedure: CYSTOSCOPY BILATERAL URETEROSCOPY/HOLMIUM LASER/STENT PLACEMENT;  Surgeon: Irine Seal, MD;  [facility];  Service: Urology;  Laterality: Bilateral;  . EXPLORATORY LAPAROTOMY  2016ish  . EXTRACORPOREAL SHOCK WAVE LITHOTRIPSY Right 04/22/2019   Procedure: EXTRACORPOREAL SHOCK WAVE LITHOTRIPSY (ESWL);  Surgeon: Bjorn Loser, MD;  Location: [facility];  Service: Urology;  Laterality: Right;  . EXTRACORPOREAL SHOCK WAVE LITHOTRIPSY Right 05/03/2019   Procedure: EXTRACORPOREAL SHOCK WAVE LITHOTRIPSY (ESWL);  Surgeon: Irine Seal, MD;  Location: [facility];  Service: Urology;  Laterality: Right;  . HOLMIUM LASER APPLICATION Bilateral 64/01/346   Procedure: HOLMIUM LASER APPLICATION;  Surgeon: Alexis Frock, MD;  Location: [facility];  Service: Urology;  Laterality:  Bilateral;  . TUBAL LIGATION    . WISDOM TOOTH EXTRACTION      Family History  Problem Relation Age of Onset  . Breast cancer Mother   . Depression Mother   . Rheum arthritis Paternal Grandmother   . AAA (abdominal aortic aneurysm) Paternal Grandfather   . Heart disease Sister   . Lung  cancer Maternal Grandfather    Social History:  reports that she quit smoking about 13 years ago. Her smoking use included cigarettes. She quit after 10.00 years of use. She has never used smokeless tobacco. She reports current alcohol use. She reports that she does not use drugs.  Allergies:  Allergies  Allergen Reactions  . Cardizem [Diltiazem Hcl] Shortness Of Breath    And chest pain  . Citalopram Other (See Comments)    Has reaction to ALL SSRI's per patient cause suicidal ideation  . Hydromorphone Hcl Shortness Of Breath  . Lexapro [Escitalopram Oxalate] Other (See Comments)    Has reaction to ALL SSRI's per patient cause suicidal ideation  . Norco [Hydrocodone-Acetaminophen] Other (See Comments)    Pt was not able to function, headache and nausea  . Paxil [Paroxetine Hcl] Other (See Comments)    Has reaction to ALL SSRI's per patient cause suicidal ideation  . Prozac [Fluoxetine] Other (See Comments)    Has reaction to ALL SSRI's per patient cause suicidal ideation  . Zoloft [Sertraline] Other (See Comments)    Has reaction to ALL SSRI's per patient cause suicidal ideation  . Valium [Diazepam]     Panic attack,   . Tape Rash    tegaderm and paper tape    Medications Prior to Admission  Medication Sig Dispense Refill  . Cholecalciferol (VITAMIN D3) 5000 units CAPS Take 5,000 Units by mouth daily.    . diphenhydrAMINE (BENADRYL) 50 MG capsule Take 50 mg by mouth at bedtime as needed for sleep.    Marland Kitchen gabapentin (NEURONTIN) 300 MG capsule TAKE 1 CAPSULE BY MOUTH THREE TIMES A DAY (Patient taking differently: Take 300 mg by mouth 3 (three) times daily. ) 270 capsule 0  . GLUCOSAMINE-CHONDROITIN PO Take 1 tablet by mouth 2 (two) times daily.    Marland Kitchen ketorolac (TORADOL) 10 MG tablet Take 1 tablet (10 mg total) by mouth every 6 (six) hours as needed for moderate pain. Or stent discomfort post-operatively. 20 tablet 0  . L-METHIONINE PO Take 1 tablet by mouth in the morning and at  bedtime. 500 mg per tablet    . lidocaine (LIDODERM) 5 % Place 1 patch onto the skin daily as needed (pain). Remove & Discard patch within 12 hours or as directed by MD.    . loratadine (CLARITIN) 10 MG tablet Take 10 mg by mouth every evening.     Marland Kitchen LORazepam (ATIVAN) 1 MG tablet Daily as needed (Patient taking differently: Take 1 mg by mouth daily as needed for anxiety. Daily as needed) 30 tablet 3  . metoprolol succinate (TOPROL-XL) 25 MG 24 hr tablet Take 25 mg by mouth every morning.     . mycophenolate (CELLCEPT) 500 MG tablet Take 500-1,000 mg by mouth See admin instructions. Take 1000 mg by mouth in the morning and 500 mg in the evening    . omeprazole (PRILOSEC) 20 MG capsule Take 20 mg by mouth every morning.     . ondansetron (ZOFRAN) 8 MG tablet Take 1 tablet (8 mg total) by mouth every 8 (eight) hours as needed for nausea or vomiting. 20 tablet  0  . oxybutynin (DITROPAN) 5 MG tablet Take 1 tablet (5 mg total) by mouth every 8 (eight) hours as needed for bladder spasms. 30 tablet 3  . oxybutynin (DITROPAN) 5 MG tablet Take 1 tablet (5 mg total) by mouth every 8 (eight) hours as needed for bladder spasms. 3 tablet 3  . oxybutynin (DITROPAN) 5 MG tablet Take 1 tablet (5 mg total) by mouth every 8 (eight) hours as needed for bladder spasms. 30 tablet 3  . oxyCODONE-acetaminophen (PERCOCET) 5-325 MG tablet Take 1 tablet by mouth every 6 (six) hours as needed for severe pain. Post-operatively 15 tablet 0  . polyethylene glycol (MIRALAX) 17 g packet Take 17 g by mouth daily as needed for moderate constipation.     . potassium chloride SA (KLOR-CON) 20 MEQ tablet Take 1 tablet (20 mEq total) by mouth 2 (two) times daily. 180 tablet 1  . promethazine (PHENERGAN) 25 MG tablet Take 12.5 mg by mouth every 6 (six) hours as needed for nausea or vomiting. Took this am    . traZODone (DESYREL) 100 MG tablet TAKE 1 TABLET BY MOUTH AT BEDTIME AS NEEDED FOR SLEEP (Patient taking differently: Take 100 mg by  mouth at bedtime. ) 90 tablet 1  . TURMERIC CURCUMIN PO Take 1 capsule by mouth at bedtime.       Results for orders placed or performed during the hospital encounter of 08/03/20 (from the past 48 hour(s))  SARS CORONAVIRUS 2 (TAT 6-24 HRS) Nasopharyngeal Nasopharyngeal Swab     Status: None   Collection Time: 08/03/20  8:30 AM   Specimen: Nasopharyngeal Swab  Result Value Ref Range   SARS Coronavirus 2 NEGATIVE NEGATIVE    Comment: (NOTE) SARS-CoV-2 target nucleic acids are NOT DETECTED.  The SARS-CoV-2 RNA is generally detectable in upper and lower respiratory specimens during the acute phase of infection. Negative results do not preclude SARS-CoV-2 infection, do not rule out co-infections with other pathogens, and should not be used as the sole basis for treatment or other patient management decisions. Negative results must be combined with clinical observations, patient history, and epidemiological information. The expected result is Negative.  Fact Sheet for Patients: SugarRoll.be  Fact Sheet for Healthcare Providers: https://www.woods-mathews.com/  This test is not yet approved or cleared by the [place] FDA and  has been authorized for detection and/or diagnosis of SARS-CoV-2 by FDA under an Emergency Use Authorization (EUA). This EUA will remain  in effect (meaning this test can be used) for the duration of the COVID-19 declaration under Se ction 564(b)(1) of the Act, 21 U.S.C. section 360bbb-3(b)(1), unless the authorization is terminated or revoked sooner.  Performed at [facility], [place] [address] [place], [place] [address]    No results found.  Review of Systems  Constitutional: Negative for chills and fever.  Genitourinary: Positive for frequency and urgency.  All other systems reviewed and are negative.   Blood pressure 111/74, pulse 74, temperature 97.8 F (36.6 C), temperature source Oral, resp. rate  16, height $RemoveBe'5\' 9"'plThTwkiY$  (1.753 m), weight 92.7 kg, SpO2 97 %. Physical Exam Vitals reviewed.  HENT:     Head: Normocephalic.     Nose: Nose normal.  Eyes:     Pupils: Pupils are equal, round, and reactive to light.  Cardiovascular:     Rate and Rhythm: Normal rate.     Pulses: Normal pulses.  Pulmonary:     Effort: Pulmonary effort is normal.  Abdominal:     General:  Abdomen is flat.  Genitourinary:    Comments: No CVAT a present.  Musculoskeletal:        General: Normal range of motion.     Cervical back: Normal range of motion.  Skin:    General: Skin is warm.  Neurological:     General: No focal deficit present.     Mental Status: She is alert.  Psychiatric:        Mood and Affect: Mood normal.      Assessment/Plan  Proceed as planned wih BILATERAL 2nd stage uretersocopy with goal of stone free. Risks, benefits, alternative, expected peri-o course discused previously and reiteratd today.   Alexis Frock, MD 08/04/2020, 3:47 PM

## 2020-08-04 NOTE — Anesthesia Postprocedure Evaluation (Signed)
Anesthesia Post Note  Patient: [name]  Procedure(s) Performed: CYSTOSCOPY WITH RETROGRADE PYELOGRAM, URETEROSCOPY WITH BASKETING OF STONE AND STENT EXCHANGE (Bilateral )     Patient location during evaluation: PACU Anesthesia Type: General Level of consciousness: sedated and patient cooperative Pain management: pain level controlled Vital Signs Assessment: post-procedure vital signs reviewed and stable Respiratory status: spontaneous breathing Cardiovascular status: stable Anesthetic complications: no   No complications documented.  Last Vitals:  Vitals:   08/04/20 1745 08/04/20 1800  BP: (!) 142/80 (!) 140/99  Pulse: 78 80  Resp: 20 18  Temp: 36.6 C 36.5 C  SpO2: 100% 100%    Last Pain:  Vitals:   08/04/20 1800  TempSrc:   PainSc: 0-No pain                 Lewie Loron

## 2020-08-04 NOTE — Discharge Instructions (Signed)
1 - You may have urinary urgency (bladder spasms) and bloody urine on / off with stent in place. This is normal.  2 - Remove tethered stents on Monday morning at home by pulling on string, then blue-white plastic tubing and discarding. Office is open Monday if any problems arise.   3 - Call MD or go to ER for fever >102, severe pain / nausea / vomiting not relieved by medications, or acute change in medical status

## 2020-08-04 NOTE — Transfer of Care (Signed)
Immediate Anesthesia Transfer of Care Note  Patient: Morenike Cuff Arciga  Procedure(s) Performed: CYSTOSCOPY WITH RETROGRADE PYELOGRAM, URETEROSCOPY WITH BASKETING OF STONE AND STENT EXCHANGE (Bilateral )  Patient Location: PACU  Anesthesia Type:General  Level of Consciousness: sedated  Airway & Oxygen Therapy: Patient Spontanous Breathing and Patient connected to face mask oxygen  Post-op Assessment: Report given to RN and Post -op Vital signs reviewed and stable  Post vital signs: Reviewed and stable  Last Vitals:  Vitals Value Taken Time  BP 158/59 08/04/20 1706  Temp    Pulse 79 08/04/20 1708  Resp 11 08/04/20 1708  SpO2 100 % 08/04/20 1708  Vitals shown include unvalidated device data.  Last Pain:  Vitals:   08/04/20 1411  TempSrc:   PainSc: 3       Patients Stated Pain Goal: 3 (08/04/20 1411)  Complications: No complications documented.

## 2020-08-05 ENCOUNTER — Encounter (HOSPITAL_COMMUNITY): Payer: Self-pay | Admitting: Urology

## 2020-08-06 ENCOUNTER — Other Ambulatory Visit: Payer: Self-pay | Admitting: Family Medicine

## 2021-03-19 ENCOUNTER — Emergency Department (HOSPITAL_BASED_OUTPATIENT_CLINIC_OR_DEPARTMENT_OTHER)
Admission: EM | Admit: 2021-03-19 | Discharge: 2021-03-19 | Disposition: A | Discharge status: Home / Self Care | Payer: Commercial Managed Care - PPO | Attending: Emergency Medicine | Admitting: Emergency Medicine

## 2021-03-19 ENCOUNTER — Encounter (HOSPITAL_BASED_OUTPATIENT_CLINIC_OR_DEPARTMENT_OTHER): Payer: Self-pay

## 2021-03-19 ENCOUNTER — Other Ambulatory Visit: Payer: Self-pay

## 2021-03-19 ENCOUNTER — Emergency Department (HOSPITAL_BASED_OUTPATIENT_CLINIC_OR_DEPARTMENT_OTHER): Payer: Commercial Managed Care - PPO

## 2021-03-19 ENCOUNTER — Emergency Department (HOSPITAL_BASED_OUTPATIENT_CLINIC_OR_DEPARTMENT_OTHER): Payer: Commercial Managed Care - PPO | Admitting: Radiology

## 2021-03-19 DIAGNOSIS — Z87891 Personal history of nicotine dependence: Secondary | ICD-10-CM | POA: Insufficient documentation

## 2021-03-19 DIAGNOSIS — R55 Syncope and collapse: Secondary | ICD-10-CM | POA: Diagnosis not present

## 2021-03-19 LAB — BASIC METABOLIC PANEL
Anion gap: 8 (ref 5–15)
BUN: 15 mg/dL (ref 6–20)
CO2: 21 mmol/L — ABNORMAL LOW (ref 22–32)
Calcium: 9 mg/dL (ref 8.9–10.3)
Chloride: 104 mmol/L (ref 98–111)
Creatinine, Ser: 1.21 mg/dL — ABNORMAL HIGH (ref 0.44–1.00)
GFR, Estimated: 57 mL/min — ABNORMAL LOW (ref >60)
Glucose, Bld: 111 mg/dL — ABNORMAL HIGH (ref 70–99)
Potassium: 3.8 mmol/L (ref 3.5–5.1)
Sodium: 133 mmol/L — ABNORMAL LOW (ref 135–145)

## 2021-03-19 LAB — CBC
HCT: 37.8 % (ref 36.0–46.0)
Hemoglobin: 12.7 g/dL (ref 12.0–15.0)
MCH: 31.1 pg (ref 26.0–34.0)
MCHC: 33.6 g/dL (ref 30.0–36.0)
MCV: 92.4 fL (ref 80.0–100.0)
Platelets: 156 10*3/uL (ref 150–400)
RBC: 4.09 MIL/uL (ref 3.87–5.11)
RDW: 14 % (ref 11.5–15.5)
WBC: 4 10*3/uL (ref 4.0–10.5)
nRBC: 0 % (ref 0.0–0.2)

## 2021-03-19 LAB — URINALYSIS, ROUTINE W REFLEX MICROSCOPIC
Bilirubin Urine: NEGATIVE
Glucose, UA: NEGATIVE mg/dL
Hgb urine dipstick: NEGATIVE
Ketones, ur: NEGATIVE mg/dL
Leukocytes,Ua: NEGATIVE
Nitrite: NEGATIVE
Protein, ur: NEGATIVE mg/dL
Specific Gravity, Urine: 1.011 (ref 1.005–1.030)
pH: 7 (ref 5.0–8.0)

## 2021-03-19 NOTE — ED Notes (Signed)
Patient currently in CT will update vitals when patient returns

## 2021-03-19 NOTE — ED Triage Notes (Signed)
Patient had syncopal episode this morning around 7:15. States she remembers feeling dizzy but then woke up with her husband standing over her. She doesn't remember other events surrounding event and couldn't remember how to get to work. Patient states she did go to work today. Remembers drinking coffee but doesn't remember making the coffee.

## 2021-03-19 NOTE — Discharge Instructions (Signed)
Follow-up with your primary care doctor.  Return to ER if you have any episodes of passing out, any chest pain or difficulty in breathing or other new concerning symptom.

## 2021-03-19 NOTE — ED Provider Notes (Signed)
[facility] Provider [facility]   CSN: [id number] Arrival date & time: 03/19/21  1414     History Chief Complaint  Patient presents with  . Loss of Consciousness    Patient reports she had a syncopal episode at home around 7:15AM.     [name] is a 45 y.o. female.  Presented to ER with concern for syncope.  Patient reports that episode happened this morning.  Remembers waking up and going in her bedroom.  States that she felt lightheaded and then everything went dark and she passed out.  Thinks that she may have hit her head.  Denies any midline neck pain.  States that she had some memory issues around the time of the incident and does not remember exactly waking up.  No bladder or bowel incontinence.  No tongue or lip injury.  Has had 2 prior episodes of syncope in her life.  Reports that she has had PVCs previously but denies any cardiac arrhythmia history.  Has had Holter monitor previously which she reports was normal.  HPI     Past Medical History:  Diagnosis Date  . anxiety/panic attacks   . Bladder spasms   . Chicken pox   . Chronic constipation   . Chronic headaches    Occasional migraines  . Complication of anesthesia    03-03-2018 post-op sx, pt complain chest pain, testing negative and cardiologist consult, dr hochrien note in epic dated 05-21 and 03-04-2018 stating atypical chest pain, not cardiac  . Depression   . Dizziness   . Environmental allergies   . GERD (gastroesophageal reflux disease)    Prilosec for nausea, not GERD  . Gestational diabetes 07/08/2018  . Hemorrhoid   . History of kidney stones   . Hypokalemia, chronic   . Orthostatic hypotension    per pt get orthostatic with changes of positions quickly --- treatment w/ metoprolol and compression stockings  . Ovarian cyst   . PONV (postoperative nausea and vomiting)   . PVC (premature ventricular contraction)    intermittant PVC's , per pt symtomatic -- treated with  metoprolol and potassium supplementation  . Renal insufficiency    secondary to sjogrens syndrome--- per [place] in [place]  . Sjogren's syndrome (HCC)    rheumotologist-  per pt is in [place]  . SOB (shortness of breath)   . Syncope and collapse     Patient Active Problem List   Diagnosis Date Noted  . Chronic bilateral low back pain with bilateral sciatica 12/23/2018  . Renal calculus 07/17/2018  . Seasonal allergies 07/12/2018  . Vitamin D deficiency 07/12/2018  . Primary insomnia 07/12/2018  . GAD (generalized anxiety disorder) 07/12/2018  . Constipation 07/08/2018  . Sjogren's syndrome (HCC) 07/08/2018  . GERD (gastroesophageal reflux disease)     Past Surgical History:  Procedure Laterality Date  . ABDOMINAL HYSTERECTOMY     PARTIAL  . CESAREAN SECTION  x 3 LAST ONE 2012   W/ BILATERAL TUBLE LIGATION w/ last c/s  . CYSTOSCOPY W/ URETERAL STENT PLACEMENT Bilateral 06/22/2020   Procedure: CYSTOSCOPY WITH RETROGRADE PYELOGRAM/URETERAL STENT PLACEMENT;  Surgeon: Sebastian AcheManny, Theodore, MD;  Location: [facility];  Service: Urology;  Laterality: Bilateral;  . CYSTOSCOPY WITH RETROGRADE PYELOGRAM, URETEROSCOPY AND STENT PLACEMENT Bilateral 07/14/2020   Procedure: CYSTOSCOPY WITH RETROGRADE PYELOGRAM, URETEROSCOPY AND STENT EXCHANGE, FIRST STAGE;  Surgeon: Sebastian AcheManny, Theodore, MD;  Location: [facility];  Service: Urology;  Laterality: Bilateral;  90 MINS  . CYSTOSCOPY WITH RETROGRADE PYELOGRAM,  URETEROSCOPY AND STENT PLACEMENT Bilateral 08/04/2020   Procedure: CYSTOSCOPY WITH RETROGRADE PYELOGRAM, URETEROSCOPY WITH BASKETING OF STONE AND STENT EXCHANGE;  Surgeon: Sebastian Ache, MD;  Location: [facility];  Service: Urology;  Laterality: Bilateral;  75 MINS  . CYSTOSCOPY WITH STENT PLACEMENT Bilateral 03/03/2018   Procedure: CYSTOSCOPY WITH BILATERAL STENT PLACEMENT;  Surgeon: Bjorn Pippin, MD;  Location: [facility] OR;  Service: Urology;  Laterality: Bilateral;  . CYSTOSCOPY/URETEROSCOPY/HOLMIUM  LASER/STENT PLACEMENT Bilateral 03/17/2018   Procedure: CYSTOSCOPY BILATERAL URETEROSCOPY/HOLMIUM LASER/STENT PLACEMENT;  Surgeon: Bjorn Pippin, MD;  Location: [facility];  Service: Urology;  Laterality: Bilateral;  . EXPLORATORY LAPAROTOMY  2016ish  . EXTRACORPOREAL SHOCK WAVE LITHOTRIPSY Right 04/22/2019   Procedure: EXTRACORPOREAL SHOCK WAVE LITHOTRIPSY (ESWL);  Surgeon: Alfredo Martinez, MD;  Location: [facility];  Service: Urology;  Laterality: Right;  . EXTRACORPOREAL SHOCK WAVE LITHOTRIPSY Right 05/03/2019   Procedure: EXTRACORPOREAL SHOCK WAVE LITHOTRIPSY (ESWL);  Surgeon: Bjorn Pippin, MD;  Location: [facility];  Service: Urology;  Laterality: Right;  . HOLMIUM LASER APPLICATION Bilateral 07/14/2020   Procedure: HOLMIUM LASER APPLICATION;  Surgeon: Sebastian Ache, MD;  Location: [facility];  Service: Urology;  Laterality: Bilateral;  . TUBAL LIGATION    . WISDOM TOOTH EXTRACTION       OB History   No obstetric history on file.     Family History  Problem Relation Age of Onset  . Breast cancer Mother   . Depression Mother   . Rheum arthritis Paternal Grandmother   . AAA (abdominal aortic aneurysm) Paternal Grandfather   . Heart disease Sister   . Lung cancer Maternal Grandfather     Social History   Tobacco Use  . Smoking status: Former Smoker    Years: 10.00    Types: Cigarettes    Quit date: 03/17/2007    Years since quitting: 14.0  . Smokeless tobacco: Never Used  Vaping Use  . Vaping Use: Never used  Substance Use Topics  . Alcohol use: Yes    Comment: Occasional  . Drug use: Never    Home Medications Prior to Admission medications   Medication Sig Start Date End Date Taking? Authorizing Provider  Cholecalciferol (VITAMIN D3) 5000 units CAPS Take 5,000 Units by mouth daily.    [provider]  diphenhydrAMINE (BENADRYL) 50 MG capsule Take 50 mg by mouth at bedtime as needed for sleep.    [provider]  gabapentin (NEURONTIN) 300 MG capsule  TAKE 1 CAPSULE BY MOUTH THREE TIMES A DAY Patient taking differently: Take 300 mg by mouth 3 (three) times daily.  07/03/20   Orland Mustard, MD  GLUCOSAMINE-CHONDROITIN PO Take 1 tablet by mouth 2 (two) times daily.    [provider]  ketorolac (TORADOL) 10 MG tablet Take 1 tablet (10 mg total) by mouth every 8 (eight) hours as needed for moderate pain. Or stent discomfort post-operatively. 08/04/20   Sebastian Ache, MD  L-METHIONINE PO Take 1 tablet by mouth in the morning and at bedtime. 500 mg per tablet    [provider]  lidocaine (LIDODERM) 5 % Place 1 patch onto the skin daily as needed (pain). Remove & Discard patch within 12 hours or as directed by MD.    [provider]  loratadine (CLARITIN) 10 MG tablet Take 10 mg by mouth every evening.     [provider]  LORazepam (ATIVAN) 1 MG tablet Daily as needed Patient taking differently: Take 1 mg by mouth daily as needed for anxiety. Daily as needed 07/13/19  Helane Rima, DO  metoprolol succinate (TOPROL-XL) 25 MG 24 hr tablet Take 25 mg by mouth every morning.     [provider]  mycophenolate (CELLCEPT) 500 MG tablet Take 500-1,000 mg by mouth See admin instructions. Take 1000 mg by mouth in the morning and 500 mg in the evening    [provider]  omeprazole (PRILOSEC) 20 MG capsule Take 20 mg by mouth every morning.     [provider]  ondansetron (ZOFRAN) 8 MG tablet Take 1 tablet (8 mg total) by mouth every 8 (eight) hours as needed for nausea or vomiting. 04/19/19   Helane Rima, DO  oxybutynin (DITROPAN) 5 MG tablet Take 1 tablet (5 mg total) by mouth every 8 (eight) hours as needed for bladder spasms. 07/14/20   Sebastian Ache, MD  oxybutynin (DITROPAN) 5 MG tablet Take 1 tablet (5 mg total) by mouth every 8 (eight) hours as needed for bladder spasms. 07/14/20   Sebastian Ache, MD  oxybutynin (DITROPAN) 5 MG tablet Take 1 tablet (5 mg total) by mouth every 8 (eight)  hours as needed for bladder spasms. 07/14/20   Sebastian Ache, MD  oxyCODONE-acetaminophen (PERCOCET) 5-325 MG tablet Take 1 tablet by mouth every 6 (six) hours as needed for severe pain. Post-operatively 08/04/20 08/04/21  Sebastian Ache, MD  polyethylene glycol (MIRALAX) 17 g packet Take 17 g by mouth daily as needed for moderate constipation.     [provider]  potassium chloride SA (KLOR-CON) 20 MEQ tablet Take 1 tablet (20 mEq total) by mouth 2 (two) times daily. 12/20/19   Orland Mustard, MD  promethazine (PHENERGAN) 25 MG tablet Take 12.5 mg by mouth every 6 (six) hours as needed for nausea or vomiting. Took this am    [provider]  traZODone (DESYREL) 100 MG tablet Take 1 tablet (100 mg total) by mouth at bedtime. 08/07/20   Orland Mustard, MD  TURMERIC CURCUMIN PO Take 1 capsule by mouth at bedtime.     [provider]    Allergies    Cardizem [diltiazem hcl], Citalopram, Hydromorphone hcl, Lexapro [escitalopram oxalate], Norco [hydrocodone-acetaminophen], Paxil [paroxetine hcl], Prozac [fluoxetine], Zoloft [sertraline], Tape, and Valium [diazepam]  Review of Systems   Review of Systems  Constitutional: Negative for chills and fever.  HENT: Negative for ear pain and sore throat.   Eyes: Negative for pain and visual disturbance.  Respiratory: Negative for cough and shortness of breath.   Cardiovascular: Negative for chest pain and palpitations.  Gastrointestinal: Negative for abdominal pain and vomiting.  Genitourinary: Negative for dysuria and hematuria.  Musculoskeletal: Negative for arthralgias and back pain.  Skin: Negative for color change and rash.  Neurological: Positive for syncope. Negative for seizures.  All other systems reviewed and are negative.   Physical Exam Updated Vital Signs BP 135/78 (BP Location: Left Arm)   Pulse 81   Temp 98.3 F (36.8 C) (Oral)   Resp 17   Ht 5\' 9"  (1.753 m)   Wt 95.3 kg   SpO2 98%   BMI 31.01 kg/m    Physical Exam Vitals and nursing note reviewed.  Constitutional:      General: She is not in acute distress.    Appearance: She is well-developed.  HENT:     Head: Normocephalic and atraumatic.  Eyes:     Conjunctiva/sclera: Conjunctivae normal.  Neck:     Comments: Some tenderness to the lateral left neck muscles but no midline tenderness Cardiovascular:     Rate and  Rhythm: Normal rate and regular rhythm.     Heart sounds: No murmur heard.   Pulmonary:     Effort: Pulmonary effort is normal. No respiratory distress.     Breath sounds: Normal breath sounds.  Abdominal:     Palpations: Abdomen is soft.     Tenderness: There is no abdominal tenderness.  Musculoskeletal:        General: No deformity.     Cervical back: Neck supple.     Comments: Back: no C, T, L spine TTP, no step off or deformity RUE: no TTP throughout, no deformity, normal joint ROM, radial pulse intact, distal sensation and motor intact LUE: no TTP throughout, no deformity, normal joint ROM, radial pulse intact, distal sensation and motor intact RLE:  no TTP throughout, no deformity, normal joint ROM, distal pulse, sensation and motor intact LLE: no TTP throughout, no deformity, normal joint ROM, distal pulse, sensation and motor intact  Skin:    General: Skin is warm and dry.  Neurological:     Mental Status: She is alert.     ED Results / Procedures / Treatments   Labs (all labs ordered are listed, but only abnormal results are displayed) Labs Reviewed  BASIC METABOLIC PANEL - Abnormal; Notable for the following components:      Result Value   Sodium 133 (*)    CO2 21 (*)    Glucose, Bld 111 (*)    Creatinine, Ser 1.21 (*)    GFR, Estimated 57 (*)    All other components within normal limits  CBC  URINALYSIS, ROUTINE W REFLEX MICROSCOPIC    EKG EKG Interpretation  Date/Time:  Monday March 19 2021 14:29:59 EDT Ventricular Rate:  93 PR Interval:  149 QRS Duration: 95 QT  Interval:  396 QTC Calculation: 493 R Axis:   65 Text Interpretation: Sinus rhythm Low voltage, precordial leads Borderline T abnormalities, diffuse leads Borderline prolonged QT interval When compared to prior, similar appearance with longer QTc. No STEMI Confirmed by Theda Belfast ([id number]) on 03/19/2021 2:33:00 PM   Radiology DG Chest 2 View  Result Date: 03/19/2021 CLINICAL DATA:  Syncope, weakness EXAM: CHEST - 2 VIEW COMPARISON:  07/23/2020 FINDINGS: The heart size and mediastinal contours are within normal limits. No focal airspace consolidation, pleural effusion, or pneumothorax. The visualized skeletal structures are unremarkable. IMPRESSION: No active cardiopulmonary disease. Electronically Signed   By: Duanne Guess D.O.   On: 03/19/2021 15:30   CT Head Wo Contrast  Result Date: 03/19/2021 CLINICAL DATA:  Syncope. EXAM: CT HEAD WITHOUT CONTRAST TECHNIQUE: Contiguous axial images were obtained from the base of the skull through the vertex without intravenous contrast. COMPARISON:  July 23, 2020 FINDINGS: Brain: No evidence of acute infarction, hemorrhage, hydrocephalus, extra-axial collection or mass lesion/mass effect. Vascular: No hyperdense vessel or unexpected calcification. Skull: Normal. Negative for fracture or focal lesion. Sinuses/Orbits: No acute finding. Other: None. IMPRESSION: No acute intracranial abnormality. Electronically Signed   By: Aram Candela M.D.   On: 03/19/2021 16:18    Procedures Procedures   Medications Ordered in ED Medications - No data to display  ED Course  I have reviewed the triage vital signs and the nursing notes.  Pertinent labs & imaging results that were available during my care of the patient were reviewed by me and considered in my medical decision making (see chart for details).    MDM Rules/Calculators/A&P  45 year old lady presented to ER with concern for syncopal episode this morning.  Here, patient was  well-appearing in no distress.  Vitals were stable.  EKG without acute change, basic labs stable.  No events on telemetry monitoring.  Given her reported possible head trauma and the initial confusion after the episode, check CT head.  This was negative for acute pathology.  Given her reassuring appearance at present and reassuring work-up, recommend patient follow-up with her primary care doctor in the outpatient setting.  Discharged home. Suspect most likely vasovagal in nature.     After the discussed management above, the patient was determined to be safe for discharge.  The patient was in agreement with this plan and all questions regarding their care were answered.  ED return precautions were discussed and the patient will return to the ED with any significant worsening of condition.      Final Clinical Impression(s) / ED Diagnoses Final diagnoses:  Syncope, unspecified syncope type    Rx / DC Orders ED Discharge Orders    None       Milagros Loll, MD 03/19/21 [phone]

## 2021-05-15 ENCOUNTER — Encounter (HOSPITAL_BASED_OUTPATIENT_CLINIC_OR_DEPARTMENT_OTHER): Payer: Self-pay | Admitting: Cardiology

## 2021-05-15 ENCOUNTER — Other Ambulatory Visit: Payer: Self-pay

## 2021-05-15 ENCOUNTER — Ambulatory Visit (HOSPITAL_BASED_OUTPATIENT_CLINIC_OR_DEPARTMENT_OTHER): Payer: Commercial Managed Care - PPO | Admitting: Cardiology

## 2021-05-15 VITALS — BP 112/90 | HR 79 | Ht 69.0 in | Wt 210.4 lb

## 2021-05-15 DIAGNOSIS — I493 Ventricular premature depolarization: Secondary | ICD-10-CM

## 2021-05-15 DIAGNOSIS — R55 Syncope and collapse: Secondary | ICD-10-CM

## 2021-05-15 DIAGNOSIS — Z7189 Other specified counseling: Secondary | ICD-10-CM

## 2021-05-15 NOTE — Assessment & Plan Note (Signed)
Reviewed ER note from 03/19/2021. Vitals stable, labs stable, no events on telemetry, CT head negative.

## 2021-05-15 NOTE — Assessment & Plan Note (Signed)
Per note 03/03/2018: "She has a history of PVCs and was very symptomatic from them.  As part of her evaluation for the PVCs as well as for the orthostatic hypotension, she has had extensive cardiac testing.  She has had 2 stress tests, a cardiac catheterization approximately 2 years ago that was clean, a tilt table test and 2 outpatient monitors.  To treat the orthostatic hypotension, she has liberalized salt intake, is on a low dose of metoprolol, and wears compression stockings daily."

## 2021-05-15 NOTE — Progress Notes (Signed)
Cardiology Office Note:    Date:  05/16/2021   ID:  [name], DOB 08-05-1976, MRN [id number]  PCP:  Melida Quitter, MD  Cardiologist:  Jodelle Red, MD  Referring MD: Melida Quitter, MD   CC. New patient consultation for recurrent syncope  History of Present Illness:    [name] is a 45 y.o. female with a hx of orthostatic hypotension, PVC's, Sjogren's syndrome with secondary renal insufficiency, and syncope, who is seen as a new consult at the request of Nadene Rubins, Nyoka Cowden, MD for the evaluation and management of recurrent syncope and collapse.  Today: Syncope: Initial episode: 8 years ago Frequency: Infrequent, three main episodes Duration of episodes: most recently somewhat unclear, as she can't remember portions immediately after the event (recalls he husband standing over her, then next memory is sitting drinking coffee).  Presyncopal symptoms: First episode: severe abdominal pain, possible dehydration. Most recent: severe dizziness. Pre-existing medical conditions: Sjogren's syndrome, orthostatic hypotension, PVC's. Potential medication/supplement interactions: reviewed medication list with her today. Syncope predates lamictal and bupropion Prior workup: Multiple Echos, Stress Tests, holter monitors, tilt table, CT angiogram with Dr. Foster Simpson in [place], [place]. Unable to see records, per report have been normal. Family history: In her maternal family, all women become dizzy if changing position too quickly.  Previously she was followed in [place] by electrophysiologist Dr. Rivka Spring at [facility]. She has lived here locally in the past 3 years.   Her first syncopal episode occurred 8 years ago while on a trip. After returning to the hotel in the evening, she developed severe abdominal pain. She went to the restroom and passed out, but she does not remember falling to the floor. She notes she may have been dehydrated. At the ED she was told her syncope may have  been due to a ruptured uterine cyst.   Some time afterwards, she began to have a lot of PVC's. Around this time she was dx with an autoimmune disorder that resulted in kidney damage and hypokalemia. When her potassium level is not stable, she develops palpitations regardless of taking metoprolol.  Her most recent syncopal episode occurred in the morning while preparing for work. She crouched down looking for clothing, and when she stood up she became severely dizzy and attempted to sit on the edge of the bed. She then passed out, and does not remember falling. Next she remembered sitting on the couch fully dressed. There was a 30-minute time loss that she feels was likely caused by a concussion. She struck her head twice on furniture during the fall.  For activity, she is a Engineer, civil (consulting). With her health issues and COVID infection since this past Fall, she is typically at home resting when not at work. No intentional exercise.  Of note, she was told she may have neurocardiogenic syncope. She reports being orthostatic since she was a teenager. In May 2022, she was infected with COVID. Also, she has some pruritis as a side effect of lamictal.  She denies any chest pain, shortness of breath, palpitations, or exertional symptoms. No headaches, lower extremity edema, orthopnea or PND. Also denies any hematuria or blood in her stool.   Past Medical History:  Diagnosis Date   anxiety/panic attacks    Bladder spasms    Chicken pox    Chronic constipation    Chronic headaches    Occasional migraines   Complication of anesthesia    03-03-2018 post-op sx, pt complain chest pain, testing negative  and cardiologist consult, dr hochrien note in epic dated 05-21 and 03-04-2018 stating atypical chest pain, not cardiac   Depression    Dizziness    Environmental allergies    GERD (gastroesophageal reflux disease)    Prilosec for nausea, not GERD   Gestational diabetes 07/08/2018   Hemorrhoid    History of kidney  stones    Hypokalemia, chronic    Orthostatic hypotension    per pt get orthostatic with changes of positions quickly --- treatment w/ metoprolol and compression stockings   Ovarian cyst    PONV (postoperative nausea and vomiting)    PVC (premature ventricular contraction)    intermittant PVC's , per pt symtomatic -- treated with metoprolol and potassium supplementation   Renal insufficiency    secondary to sjogrens syndrome--- per pt followed by nephrologist in [place]   Sjogren's syndrome [facility])    rheumotologist-  per pt is in [place]   SOB (shortness of breath)    Syncope and collapse     Past Surgical History:  Procedure Laterality Date   ABDOMINAL HYSTERECTOMY     PARTIAL   CESAREAN SECTION  x 3 LAST ONE 2012   W/ BILATERAL TUBLE LIGATION w/ last c/s   CYSTOSCOPY W/ URETERAL STENT PLACEMENT Bilateral 06/22/2020   Procedure: CYSTOSCOPY WITH RETROGRADE PYELOGRAM/URETERAL STENT PLACEMENT;  Surgeon: Sebastian Ache, MD;  Location: [facility];  Service: Urology;  Laterality: Bilateral;   CYSTOSCOPY WITH RETROGRADE PYELOGRAM, URETEROSCOPY AND STENT PLACEMENT Bilateral 07/14/2020   Procedure: CYSTOSCOPY WITH RETROGRADE PYELOGRAM, URETEROSCOPY AND STENT EXCHANGE, FIRST STAGE;  Surgeon: Sebastian Ache, MD;  Location: [facility];  Service: Urology;  Laterality: Bilateral;  90 MINS   CYSTOSCOPY WITH RETROGRADE PYELOGRAM, URETEROSCOPY AND STENT PLACEMENT Bilateral 08/04/2020   Procedure: CYSTOSCOPY WITH RETROGRADE PYELOGRAM, URETEROSCOPY WITH BASKETING OF STONE AND STENT EXCHANGE;  Surgeon: Sebastian Ache, MD;  Location: [facility];  Service: Urology;  Laterality: Bilateral;  75 MINS   CYSTOSCOPY WITH STENT PLACEMENT Bilateral 03/03/2018   Procedure: CYSTOSCOPY WITH BILATERAL STENT PLACEMENT;  Surgeon: Bjorn Pippin, MD;  Location: [facility];  Service: Urology;  Laterality: Bilateral;   CYSTOSCOPY/URETEROSCOPY/HOLMIUM LASER/STENT PLACEMENT Bilateral 03/17/2018   Procedure: CYSTOSCOPY BILATERAL URETEROSCOPY/HOLMIUM  LASER/STENT PLACEMENT;  Surgeon: Bjorn Pippin, MD;  Location[facility];  Service: Urology;  Laterality: Bilateral;   EXPLORATORY LAPAROTOMY  2016ish   EXTRACORPOREAL SHOCK WAVE LITHOTRIPSY Right 04/22/2019   Procedure: EXTRACORPOREAL SHOCK WAVE LITHOTRIPSY (ESWL);  Surgeon: Alfredo Martinez, MD;  Location: [facility];  Service: Urology;  Laterality: Right;   EXTRACORPOREAL SHOCK WAVE LITHOTRIPSY Right 05/03/2019   Procedure: EXTRACORPOREAL SHOCK WAVE LITHOTRIPSY (ESWL);  Surgeon: Bjorn Pippin, MD;  Location: [facility];  Service: Urology;  Laterality: Right;   HOLMIUM LASER APPLICATION Bilateral 07/14/2020   Procedure: HOLMIUM LASER APPLICATION;  Surgeon: Sebastian Ache, MD;  Location: [facility];  Service: Urology;  Laterality: Bilateral;   TUBAL LIGATION     WISDOM TOOTH EXTRACTION      Current Medications: Current Outpatient Medications on File Prior to Visit  Medication Sig   buPROPion (WELLBUTRIN XL) 150 MG 24 hr tablet Take 1 tablet by mouth daily.   Cholecalciferol (VITAMIN D3) 5000 units CAPS Take 5,000 Units by mouth daily.   diphenhydrAMINE (BENADRYL) 50 MG capsule Take 50 mg by mouth at bedtime as needed for sleep.   gabapentin (NEURONTIN) 300 MG capsule TAKE 1 CAPSULE BY MOUTH THREE TIMES A DAY (Patient taking differently: Take 300 mg by mouth 3 (three) times daily.)   GLUCOSAMINE-CHONDROITIN PO Take  1 tablet by mouth 2 (two) times daily.   L-METHIONINE PO Take 1 tablet by mouth in the morning and at bedtime. 500 mg per tablet   loratadine (CLARITIN) 10 MG tablet Take 10 mg by mouth every evening.    metoprolol succinate (TOPROL-XL) 25 MG 24 hr tablet Take 25 mg by mouth every morning.    mycophenolate (CELLCEPT) 500 MG tablet Take 500-1,000 mg by mouth See admin instructions. Take 1000 mg by mouth in the morning and 500 mg in the evening   omeprazole (PRILOSEC) 20 MG capsule Take 20 mg by mouth every morning.    ondansetron (ZOFRAN) 8 MG tablet Take 1 tablet (8 mg total) by  mouth every 8 (eight) hours as needed for nausea or vomiting.   oxyCODONE-acetaminophen (PERCOCET) 5-325 MG tablet Take 1 tablet by mouth every 6 (six) hours as needed for severe pain. Post-operatively   polyethylene glycol (MIRALAX / GLYCOLAX) 17 g packet Take 17 g by mouth daily as needed for moderate constipation.    potassium chloride SA (KLOR-CON) 20 MEQ tablet Take 1 tablet (20 mEq total) by mouth 2 (two) times daily.   promethazine (PHENERGAN) 25 MG tablet Take 12.5 mg by mouth every 6 (six) hours as needed for nausea or vomiting. Took this am   traZODone (DESYREL) 100 MG tablet Take 1 tablet (100 mg total) by mouth at bedtime.   TURMERIC CURCUMIN PO Take 1 capsule by mouth at bedtime.    indapamide (LOZOL) 1.25 MG tablet Take 0.625 mg by mouth daily.   lamoTRIgine (LAMICTAL) 25 MG tablet Take 1 tablet by mouth daily.   No current facility-administered medications on file prior to visit.     Allergies:   Cardizem [diltiazem hcl], Citalopram, Hydromorphone hcl, Lexapro [escitalopram oxalate], Norco [hydrocodone-acetaminophen], Paxil [paroxetine hcl], Prozac [fluoxetine], Zoloft [sertraline], Tape, and Valium [diazepam]   Social History   Tobacco Use   Smoking status: Former    Years: 10.00    Types: Cigarettes    Quit date: 03/17/2007    Years since quitting: 14.1   Smokeless tobacco: Never  Vaping Use   Vaping Use: Never used  Substance Use Topics   Alcohol use: Yes    Comment: Occasional   Drug use: Never    Family History: family history includes AAA (abdominal aortic aneurysm) in her paternal grandfather; Breast cancer in her mother; Depression in her mother; Heart disease in her sister; Lung cancer in her maternal grandfather; Rheum arthritis in her paternal grandmother.  ROS:   Please see the history of present illness.  Additional pertinent ROS: Constitutional: Positive for anxiety. Negative for chills, fever, night sweats, unintentional weight loss  HENT: Positive  for dizziness. Negative for ear pain and hearing loss.   Eyes: Negative for loss of vision and eye pain.  Respiratory: Negative for cough, sputum, wheezing.   Cardiovascular: See HPI. Gastrointestinal: Negative for abdominal pain, melena, and hematochezia.  Genitourinary: Negative for dysuria and hematuria.  Musculoskeletal: Negative for falls and myalgias.  Skin: Negative for itching and rash.  Neurological: Positive for syncope and collapse. Negative for focal weakness, focal sensory changes.  Endo/Heme/Allergies: Does not bruise/bleed easily.     EKGs/Labs/Other Studies Reviewed:    The following studies were reviewed today: No prior CV studies available.  EKG:  EKG is personally reviewed.   05/15/2021: NSR at 79 bpm  Recent Labs: 03/19/2021: BUN 15; Creatinine, Ser 1.21; Hemoglobin 12.7; Platelets 156; Potassium 3.8; Sodium 133  Recent Lipid Panel No results found for:  CHOL, TRIG, HDL, CHOLHDL, VLDL, LDLCALC, LDLDIRECT  Physical Exam:    VS:  BP 112/90   Pulse 79   Ht 5\' 9"  (1.753 m)   Wt 210 lb 6.4 oz (95.4 kg)   BMI 31.07 kg/m    Orthostatic VS for the past 24 hrs (Last 3 readings):  BP- Lying Pulse- Lying BP- Sitting Pulse- Sitting BP- Standing at 0 minutes Pulse- Standing at 0 minutes BP- Standing at 3 minutes Pulse- Standing at 3 minutes  05/15/21 1341 127/90 81 132/87 88 (!) 147/121 91 111/88 80     Wt Readings from Last 3 Encounters:  05/15/21 210 lb 6.4 oz (95.4 kg)  03/19/21 210 lb (95.3 kg)  08/04/20 204 lb 7 oz (92.7 kg)    GEN: Well nourished, well developed in no acute distress HEENT: Normal, moist mucous membranes NECK: No JVD CARDIAC: regular rhythm, normal S1 and S2, no rubs or gallops. No murmur. VASCULAR: Radial and DP pulses 2+ bilaterally. No carotid bruits RESPIRATORY:  Clear to auscultation without rales, wheezing or rhonchi  ABDOMEN: Soft, non-tender, non-distended MUSCULOSKELETAL:  Ambulates independently SKIN: Warm and dry, no  edema NEUROLOGIC:  Alert and oriented x 3. No focal neuro deficits noted. PSYCHIATRIC:  Normal affect    ASSESSMENT:    1. Syncope, unspecified syncope type   2. PVCs (premature ventricular contractions)   3. Cardiac risk counseling   4. Counseling on health promotion and disease prevention    PLAN:    Recurrent syncope Prior history of orthostatic hypotension -we discussed the multiple possible etiologies of syncope, including both cardiac and noncardiac causes -we discussed high risk features to watch for; none noted on interview today -we discussed guideline recommended evaluation. ECG today unremarkable. She has had extensive workup in the past. We discussed pros/cons in repeating the workup. We both agreed to hold on further testing at this time, as it would likely be low yield. -she is not orthostatic today (BP actually rises with standing), but we discussed the broad umbrella of dysautonomia and management strategies. She does not meet diagnostic criteria for this, but with her autoimmune disease and history of symptoms, her pattern is similar to that which we see with dysautonomia -we discussed reflex syncope and management strategies:   -avoid dehydration. Often it requires high volumes of fluids, often with salt/electrolytes included, to stay hydrated. Oral rehydration is preferred, and routine use of IV fluids is not recommended. -if tolerated, compression stocking can assist with fluid management and prevent pooling in the legs. -slow position changes are recommended -if there is a feeling of severe lightheadedness, like near to passing out, recommend lying on the floor on the back, with legs elevated up on a chair or up against the wall. -the best long term management of symptoms is gradual exercise conditioning. I recommend seated exercises such as bike to start, to avoid the risk of falling with lightheadedness. Exercise programs, either through supervised programs like  cardiac rehab or through personal programs, should focus on gradually increasing exercise tolerance and conditioning.  -we discussed the typical spectrum of dysautonomia, including typical populations, that this sometimes spontaneously improves with age (though a small percentage have persistent symptoms), that this has uncomfortable symptoms but is not associated with long term mortality, and that the etiology/treatment of this is an area of active research   PVCs: -noted when her potassium is low. On metoprolol long term. Asymptomatic at this time  Cardiac risk counseling and prevention recommendations: -recommend heart healthy/Mediterranean diet, with  whole grains, fruits, vegetable, fish, lean meats, nuts, and olive oil. Limit salt. -recommend moderate walking, 3-5 times/week for 30-50 minutes each session. Aim for at least 150 minutes.week. Goal should be pace of 3 miles/hours, or walking 1.5 miles in 30 minutes -recommend avoidance of tobacco products. Avoid excess alcohol. -ASCVD risk score: The ASCVD Risk score Denman George DC Jr., et al., 2013) failed to calculate for the following reasons:   Cannot find a previous HDL lab   Cannot find a previous total cholesterol lab    Plan for follow up: she is comfortable with topics discussed today and wishes to follow up as needed  Jodelle Red, MD, PhD, [facility]  [facility]    Medication Adjustments/Labs and Tests Ordered: Current medicines are reviewed at length with the patient today.  Concerns regarding medicines are outlined above.  Orders Placed This Encounter  Procedures   EKG 12-Lead   No orders of the defined types were placed in this encounter.  Patient Instructions  Medication Instructions:  Your Physician recommend you continue on your current medication as directed.    *If you need a refill on your cardiac medications before your next appointment, please call your pharmacy*   Lab Work: None ordered  today   Testing/Procedures: None ordered today   Follow-Up: At [facility], you and your health needs are our priority.  As part of our continuing mission to provide you with exceptional heart care, we have created designated Provider Care Teams.  These Care Teams include your primary Cardiologist (physician) and Advanced Practice Providers (APPs -  Physician Assistants and Nurse Practitioners) who all work together to provide you with the care you need, when you need it.  We recommend signing up for the patient portal called "MyChart".  Sign up information is provided on this After Visit Summary.  MyChart is used to connect with patients for Virtual Visits (Telemedicine).  Patients are able to view lab/test results, encounter notes, upcoming appointments, etc.  Non-urgent messages can be sent to your provider as well.   To learn more about what you can do with MyChart, go to [email or web address].    Your next appointment:   As needed  The format for your next appointment:   In Person  Provider:   Jodelle Red, MD     [facility],acting as a scribe for Jodelle Red, MD.,have documented all relevant documentation on the behalf of Jodelle Red, MD,as directed by  Jodelle Red, MD while in the presence of Jodelle Red, MD.  I, Jodelle Red, MD, have reviewed all documentation for this visit. The documentation on 05/16/21 for the exam, diagnosis, procedures, and orders are all accurate and complete.   Signed, Jodelle Red, MD PhD 05/16/2021 10:39 AM    [facility]

## 2021-05-15 NOTE — Patient Instructions (Signed)

## 2021-05-18 ENCOUNTER — Encounter (HOSPITAL_BASED_OUTPATIENT_CLINIC_OR_DEPARTMENT_OTHER): Payer: Self-pay

## 2021-06-08 ENCOUNTER — Other Ambulatory Visit: Payer: Self-pay | Admitting: Internal Medicine

## 2021-06-08 DIAGNOSIS — Z1231 Encounter for screening mammogram for malignant neoplasm of breast: Secondary | ICD-10-CM

## 2021-07-20 ENCOUNTER — Ambulatory Visit
Admission: RE | Admit: 2021-07-20 | Discharge: 2021-07-20 | Disposition: A | Discharge status: Home / Self Care | Payer: Commercial Managed Care - PPO | Source: Ambulatory Visit | Attending: Internal Medicine | Admitting: Internal Medicine

## 2021-07-20 ENCOUNTER — Other Ambulatory Visit: Payer: Self-pay

## 2021-07-20 DIAGNOSIS — Z1231 Encounter for screening mammogram for malignant neoplasm of breast: Secondary | ICD-10-CM

## 2022-08-28 ENCOUNTER — Encounter: Payer: Self-pay | Admitting: Gastroenterology

## 2022-10-16 ENCOUNTER — Ambulatory Visit (AMBULATORY_SURGERY_CENTER): Payer: Commercial Managed Care - PPO | Admitting: *Deleted

## 2022-10-16 VITALS — Ht 69.0 in | Wt 215.0 lb

## 2022-10-16 DIAGNOSIS — Z1211 Encounter for screening for malignant neoplasm of colon: Secondary | ICD-10-CM

## 2022-10-16 MED ORDER — NA SULFATE-K SULFATE-MG SULF 17.5-3.13-1.6 GM/177ML PO SOLN: 1.0000 | Freq: Once | ORAL | 0 refills | Status: AC

## 2022-10-16 NOTE — Progress Notes (Signed)

## 2022-11-08 ENCOUNTER — Encounter: Payer: Commercial Managed Care - PPO | Admitting: Gastroenterology

## 2022-11-08 ENCOUNTER — Encounter: Payer: Self-pay | Admitting: Gastroenterology

## 2022-11-15 ENCOUNTER — Ambulatory Visit (AMBULATORY_SURGERY_CENTER): Payer: Commercial Managed Care - PPO | Admitting: *Deleted

## 2022-11-15 ENCOUNTER — Telehealth: Payer: Self-pay | Admitting: *Deleted

## 2022-11-15 ENCOUNTER — Encounter: Payer: Self-pay | Admitting: *Deleted

## 2022-11-15 VITALS — Ht 69.0 in | Wt 215.0 lb

## 2022-11-15 DIAGNOSIS — Z1211 Encounter for screening for malignant neoplasm of colon: Secondary | ICD-10-CM

## 2022-11-15 MED ORDER — NA SULFATE-K SULFATE-MG SULF 17.5-3.13-1.6 GM/177ML PO SOLN: 1.0000 | Freq: Once | ORAL | 0 refills | Status: AC

## 2022-11-15 NOTE — Telephone Encounter (Signed)
LM with # for return call. The other # listed on profile is same # called.

## 2022-11-15 NOTE — Progress Notes (Signed)
No egg or soy allergy known to patient  No issues known to pt with past sedation with any surgeries or procedures Patient denies ever being told they had issues or difficulty with intubation  No FH of Malignant Hyperthermia Pt is not on diet pills Pt is not on  home 02  Pt is not on blood thinners  Pt denies issues with constipation  Pt is not on dialysis Pt denies any upcoming cardiac testing Pt encouraged to use to use Singlecare or Goodrx to reduce cost  Patient's chart reviewed by John Nulty CNRA prior to previsit and patient appropriate for the [facility].  Previsit completed and red dot placed by patient's name on their procedure day (on provider's schedule).  . Visit by phone Instructions reviewed with pt and pt states understanding. Instructed to review again prior to procedure. Pt states they will. Instructions sent by mail with coupon and by my chart 

## 2022-12-06 ENCOUNTER — Encounter: Payer: Self-pay | Admitting: Gastroenterology

## 2022-12-13 ENCOUNTER — Encounter: Payer: Commercial Managed Care - PPO | Admitting: Gastroenterology

## 2022-12-19 ENCOUNTER — Encounter: Payer: Self-pay | Admitting: Certified Registered Nurse Anesthetist

## 2022-12-20 ENCOUNTER — Ambulatory Visit (AMBULATORY_SURGERY_CENTER): Payer: Commercial Managed Care - PPO | Admitting: Gastroenterology

## 2022-12-20 ENCOUNTER — Encounter: Payer: Self-pay | Admitting: Gastroenterology

## 2022-12-20 VITALS — BP 122/68 | HR 71 | Temp 97.3°F | Resp 15 | Ht 69.0 in | Wt 215.0 lb

## 2022-12-20 DIAGNOSIS — Z1211 Encounter for screening for malignant neoplasm of colon: Secondary | ICD-10-CM

## 2022-12-20 MED ORDER — SODIUM CHLORIDE 0.9 % IV SOLN: 500.0000 mL | Freq: Once | INTRAVENOUS | Status: AC

## 2022-12-20 NOTE — Progress Notes (Signed)
1155 Patient experiencing nausea and vomiting with all types of anesthesia per pt..  MD updated and Zofran 4 mg IV given, vss

## 2022-12-20 NOTE — Patient Instructions (Signed)
Resume previous diet and medications.  Repeat colonoscopy in 10 years for screening purposes.return to GI office PRN.    YOU HAD AN ENDOSCOPIC PROCEDURE TODAY AT [facility]:   Refer to the procedure report that was given to you for any specific questions about what was found during the examination.  If the procedure report does not answer your questions, please call your gastroenterologist to clarify.  If you requested that your care partner not be given the details of your procedure findings, then the procedure report has been included in a sealed envelope for you to review at your convenience later.  YOU SHOULD EXPECT: Some feelings of bloating in the abdomen. Passage of more gas than usual.  Walking can help get rid of the air that was put into your GI tract during the procedure and reduce the bloating. If you had a lower endoscopy (such as a colonoscopy or flexible sigmoidoscopy) you may notice spotting of blood in your stool or on the toilet paper. If you underwent a bowel prep for your procedure, you may not have a normal bowel movement for a few days.  Please Note:  You might notice some irritation and congestion in your nose or some drainage.  This is from the oxygen used during your procedure.  There is no need for concern and it should clear up in a day or so.  SYMPTOMS TO REPORT IMMEDIATELY:  Following lower endoscopy (colonoscopy or flexible sigmoidoscopy):  Excessive amounts of blood in the stool  Significant tenderness or worsening of abdominal pains  Swelling of the abdomen that is new, acute  Fever of 100F or higher   For urgent or emergent issues, a gastroenterologist can be reached at any hour by calling [phone]. Do not use MyChart messaging for urgent concerns.    DIET:  We do recommend a small meal at first, but then you may proceed to your regular diet.  Drink plenty of fluids but you should avoid alcoholic beverages for 24 hours.  ACTIVITY:   You should plan to take it easy for the rest of today and you should NOT DRIVE or use heavy machinery until tomorrow (because of the sedation medicines used during the test).    FOLLOW UP: Our staff will call the number listed on your records the next business day following your procedure.  We will call around 7:15- 8:00 am to check on you and address any questions or concerns that you may have regarding the information given to you following your procedure. If we do not reach you, we will leave a message.     If any biopsies were taken you will be contacted by phone or by letter within the next 1-3 weeks.  Please call us at [phone] if you have not heard about the biopsies in 3 weeks.    SIGNATURES/CONFIDENTIALITY: You and/or your care partner have signed paperwork which will be entered into your electronic medical record.  These signatures attest to the fact that that the information above on your After Visit Summary has been reviewed and is understood.  Full responsibility of the confidentiality of this discharge information lies with you and/or your care-partner.

## 2022-12-20 NOTE — Progress Notes (Signed)
Pt's states no medical or surgical changes since previsit or office visit. 

## 2022-12-20 NOTE — Progress Notes (Signed)
Report given to PACU, vss 

## 2022-12-20 NOTE — Op Note (Signed)
[facility] Patient Name: [name] Procedure Date: 12/20/2022 11:48 AM MRN: [id number] Endoscopist: Gerrit Heck , MD, [id number] Age: 47 Referring MD:  Date of Birth: [birth year] Gender: Female Account #: [id number] Procedure:                Colonoscopy Indications:              Screening for colorectal malignant neoplasm, This                            is the patient's first colonoscopy Medicines:                Monitored Anesthesia Care Procedure:                Pre-Anesthesia Assessment:                           - Prior to the procedure, a History and Physical                            was performed, and patient medications and                            allergies were reviewed. The patient's tolerance of                            previous anesthesia was also reviewed. The risks                            and benefits of the procedure and the sedation                            options and risks were discussed with the patient.                            All questions were answered, and informed consent                            was obtained. Prior Anticoagulants: The patient has                            taken no anticoagulant or antiplatelet agents. ASA                            Grade Assessment: II - A patient with mild systemic                            disease. After reviewing the risks and benefits,                            the patient was deemed in satisfactory condition to                            undergo the procedure.  After obtaining informed consent, the colonoscope                            was passed under direct vision. Throughout the                            procedure, the patient's blood pressure, pulse, and                            oxygen saturations were monitored continuously. The                            Olympus SN [id number] was introduced through the anus                            and advanced to the the  cecum, identified by                            appendiceal orifice and ileocecal valve. The                            colonoscopy was performed without difficulty. The                            patient tolerated the procedure well. The quality                            of the bowel preparation was excellent. The                            ileocecal valve, appendiceal orifice, and rectum                            were photographed. Scope In: 11:57:14 AM Scope Out: 12:10:15 PM Scope Withdrawal Time: 0 hours 10 minutes 24 seconds  Total Procedure Duration: 0 hours 13 minutes 1 second  Findings:                 The perianal and digital rectal examinations were                            normal.                           The entire colon appeared normal.                           The retroflexed view of the distal rectum and anal                            verge was normal and showed no anal or rectal                            abnormalities. Complications:            No immediate complications. Estimated  Blood Loss:     Estimated blood loss: none. Impression:               - The entire examined colon is normal.                           - The distal rectum and anal verge are normal on                            retroflexion view.                           - No specimens collected. Recommendation:           - Patient has a contact number available for                            emergencies. The signs and symptoms of potential                            delayed complications were discussed with the                            patient. Return to normal activities tomorrow.                            Written discharge instructions were provided to the                            patient.                           - Resume previous diet.                           - Continue present medications.                           - Repeat colonoscopy in 10 years for screening                             purposes.                           - Return to GI office PRN. Gerrit Heck, MD 12/20/2022 12:14:12 PM

## 2022-12-20 NOTE — Progress Notes (Signed)
GASTROENTEROLOGY PROCEDURE H&P NOTE   Primary Care Physician: Michael Boston, MD    Reason for Procedure:  Colon Cancer screening  Plan:    Colonoscopy  Patient is appropriate for endoscopic procedure(s) in the ambulatory ([place]) setting.  The nature of the procedure, as well as the risks, benefits, and alternatives were carefully and thoroughly reviewed with the patient. Ample time for discussion and questions allowed. The patient understood, was satisfied, and agreed to proceed.     HPI: [name] is a 47 y.o. female who presents for colonoscopy for routine Colon Cancer screening.  No active GI symptoms.  No known family history of colon cancer or related malignancy.  Patient is otherwise without complaints or active issues today.  Past Medical History:  Diagnosis Date   Allergy    seasonal   anxiety/panic attacks    Bladder spasms    Chicken pox    Chronic back pain    Chronic constipation    Chronic headaches    Occasional migraines   Complication of anesthesia    03-03-2018 post-op sx, pt complain chest pain, testing negative and cardiologist consult, dr hochrien note in epic dated 05-21 and 03-04-2018 stating atypical chest pain, not cardiac   Depression    Dizziness    Environmental allergies    GERD (gastroesophageal reflux disease)    Prilosec for nausea, not GERD   Gestational diabetes 07/08/2018   Hemorrhoid    History of kidney stones    Hypokalemia, chronic    Orthostatic hypotension    per pt get orthostatic with changes of positions quickly --- treatment w/ metoprolol and compression stockings   Ovarian cyst    PONV (postoperative nausea and vomiting)    PVC (premature ventricular contraction)    intermittant PVC's , per pt symtomatic -- treated with metoprolol and potassium supplementation   Renal insufficiency    secondary to sjogrens syndrome--- per pt followed by nephrologist in [place]   Sjogren's syndrome [facility])    rheumotologist-  per pt is in  [place]   SOB (shortness of breath)    Syncope and collapse     Past Surgical History:  Procedure Laterality Date   ABDOMINAL HYSTERECTOMY     PARTIAL   CESAREAN SECTION  x 3 LAST ONE 2012   W/ BILATERAL TUBLE LIGATION w/ last c/s   CYSTOSCOPY W/ URETERAL STENT PLACEMENT Bilateral 06/22/2020   Procedure: CYSTOSCOPY WITH RETROGRADE PYELOGRAM/URETERAL STENT PLACEMENT;  Surgeon: Alexis Frock, MD;  Location: [facility];  Service: Urology;  Laterality: Bilateral;   CYSTOSCOPY WITH RETROGRADE PYELOGRAM, URETEROSCOPY AND STENT PLACEMENT Bilateral 07/14/2020   Procedure: CYSTOSCOPY WITH RETROGRADE PYELOGRAM, URETEROSCOPY AND STENT EXCHANGE, FIRST STAGE;  Surgeon: Alexis Frock, MD;  Location: [facility];  Service: Urology;  Laterality: Bilateral;  64 MINS   CYSTOSCOPY WITH RETROGRADE PYELOGRAM, URETEROSCOPY AND STENT PLACEMENT Bilateral 08/04/2020   Procedure: CYSTOSCOPY WITH RETROGRADE PYELOGRAM, URETEROSCOPY WITH BASKETING OF STONE AND STENT EXCHANGE;  Surgeon: Alexis Frock, MD;  Location: [facility];  Service: Urology;  Laterality: Bilateral;  75 MINS   CYSTOSCOPY WITH STENT PLACEMENT Bilateral 03/03/2018   Procedure: CYSTOSCOPY WITH BILATERAL STENT PLACEMENT;  Surgeon: Irine Seal, MD;  Location: [place];  Service: Urology;  Laterality: Bilateral;   CYSTOSCOPY/URETEROSCOPY/HOLMIUM LASER/STENT PLACEMENT Bilateral 03/17/2018   Procedure: CYSTOSCOPY BILATERAL URETEROSCOPY/HOLMIUM LASER/STENT PLACEMENT;  Surgeon: Irine Seal, MD;  Location: [facility];  Service: Urology;  Laterality: Bilateral;   EXPLORATORY LAPAROTOMY  2016ish   EXTRACORPOREAL SHOCK WAVE LITHOTRIPSY Right  04/22/2019   Procedure: EXTRACORPOREAL SHOCK WAVE LITHOTRIPSY (ESWL);  Surgeon: Bjorn Loser, MD;  Location: [facility];  Service: Urology;  Laterality: Right;   EXTRACORPOREAL SHOCK WAVE LITHOTRIPSY Right 05/03/2019   Procedure: EXTRACORPOREAL SHOCK WAVE LITHOTRIPSY (ESWL);  Surgeon: Irine Seal, MD;  Location: [facility];  Service:  Urology;  Laterality: Right;   HOLMIUM LASER APPLICATION Bilateral [id number]   Procedure: HOLMIUM LASER APPLICATION;  Surgeon: Alexis Frock, MD;  Location: [facility];  Service: Urology;  Laterality: Bilateral;   TUBAL LIGATION     WISDOM TOOTH EXTRACTION      Prior to Admission medications   Medication Sig Start Date End Date Taking? Authorizing Provider  buPROPion (WELLBUTRIN XL) 150 MG 24 hr tablet Take 1 tablet by mouth daily. 10/26/20  Yes [provider]  Cholecalciferol (VITAMIN D3) 5000 units CAPS Take 5,000 Units by mouth daily.   Yes [provider]  gabapentin (NEURONTIN) 300 MG capsule TAKE 1 CAPSULE BY MOUTH THREE TIMES A DAY Patient taking differently: Take 300 mg by mouth 3 (three) times daily. 07/03/20  Yes Orma Flaming, MD  indapamide (LOZOL) 1.25 MG tablet Take 0.625 mg by mouth daily. 02/16/21  Yes [provider]  L-METHIONINE PO Take 1 tablet by mouth in the morning and at bedtime. 500 mg per tablet   Yes [provider]  lamoTRIgine (LAMICTAL) 25 MG tablet Take 1 tablet by mouth daily. 05/09/21  Yes [provider]  loratadine (CLARITIN) 10 MG tablet Take 10 mg by mouth every evening.    Yes [provider]  metoprolol succinate (TOPROL-XL) 25 MG 24 hr tablet Take 25 mg by mouth every morning.    Yes [provider]  mycophenolate (CELLCEPT) 500 MG tablet Take 500-1,000 mg by mouth See admin instructions. Take 1000 mg by mouth in the morning and 500 mg in the evening   Yes [provider]  omeprazole (PRILOSEC) 20 MG capsule Take 20 mg by mouth every morning.    Yes [provider]  ondansetron (ZOFRAN) 8 MG tablet Take 1 tablet (8 mg total) by mouth every 8 (eight) hours as needed for nausea or vomiting. 04/19/19  Yes Briscoe Deutscher, DO  potassium chloride SA (KLOR-CON) 20 MEQ tablet Take 1 tablet (20 mEq total) by mouth 2 (two) times daily. Patient taking differently: Take 20 mEq by mouth daily.  12/20/19  Yes Orma Flaming, MD  traZODone (DESYREL) 100 MG tablet Take 1 tablet (100 mg total) by mouth at bedtime. Patient taking differently: Take 150 mg by mouth at bedtime. 08/07/20  Yes Orma Flaming, MD  VRAYLAR 3 MG capsule Take 3 mg by mouth daily.   Yes [provider]  diphenhydrAMINE (BENADRYL) 50 MG capsule Take 50 mg by mouth at bedtime as needed for sleep.    [provider]  polyethylene glycol (MIRALAX / GLYCOLAX) 17 g packet Take 17 g by mouth daily as needed for moderate constipation.    [provider]  TURMERIC CURCUMIN PO Take 1 capsule by mouth at bedtime.  Patient not taking: Reported on 11/15/2022    [provider]    Current Outpatient Medications  Medication Sig Dispense Refill   buPROPion (WELLBUTRIN XL) 150 MG 24 hr tablet Take 1 tablet by mouth daily.     Cholecalciferol (VITAMIN D3) 5000 units CAPS Take 5,000 Units by mouth daily.     gabapentin (NEURONTIN) 300 MG capsule TAKE 1 CAPSULE BY MOUTH THREE TIMES A DAY (Patient taking differently: Take 300 mg  by mouth 3 (three) times daily.) 270 capsule 0   indapamide (LOZOL) 1.25 MG tablet Take 0.625 mg by mouth daily.     L-METHIONINE PO Take 1 tablet by mouth in the morning and at bedtime. 500 mg per tablet     lamoTRIgine (LAMICTAL) 25 MG tablet Take 1 tablet by mouth daily.     loratadine (CLARITIN) 10 MG tablet Take 10 mg by mouth every evening.      metoprolol succinate (TOPROL-XL) 25 MG 24 hr tablet Take 25 mg by mouth every morning.      mycophenolate (CELLCEPT) 500 MG tablet Take 500-1,000 mg by mouth See admin instructions. Take 1000 mg by mouth in the morning and 500 mg in the evening     omeprazole (PRILOSEC) 20 MG capsule Take 20 mg by mouth every morning.      ondansetron (ZOFRAN) 8 MG tablet Take 1 tablet (8 mg total) by mouth every 8 (eight) hours as needed for nausea or vomiting. 20 tablet 0   potassium chloride SA (KLOR-CON) 20 MEQ tablet Take 1 tablet (20 mEq  total) by mouth 2 (two) times daily. (Patient taking differently: Take 20 mEq by mouth daily.) 180 tablet 1   traZODone (DESYREL) 100 MG tablet Take 1 tablet (100 mg total) by mouth at bedtime. (Patient taking differently: Take 150 mg by mouth at bedtime.) 90 tablet 1   VRAYLAR 3 MG capsule Take 3 mg by mouth daily.     diphenhydrAMINE (BENADRYL) 50 MG capsule Take 50 mg by mouth at bedtime as needed for sleep.     polyethylene glycol (MIRALAX / GLYCOLAX) 17 g packet Take 17 g by mouth daily as needed for moderate constipation.     TURMERIC CURCUMIN PO Take 1 capsule by mouth at bedtime.  (Patient not taking: Reported on 11/15/2022)     Current Facility-Administered Medications  Medication Dose Route Frequency Provider Last Rate Last Admin   0.9 %  sodium chloride infusion  500 mL Intravenous Once Akshath Mccarey V, DO        Allergies as of 12/20/2022 - Review Complete 12/20/2022  Allergen Reaction Noted   Cardizem [diltiazem hcl] Shortness Of Breath 03/03/2018   Citalopram Other (See Comments) 10/20/2018   Hydromorphone hcl Shortness Of Breath 03/03/2018   Lexapro [escitalopram oxalate] Other (See Comments) 10/20/2018   Paxil [paroxetine hcl] Other (See Comments) 10/20/2018   Prozac [fluoxetine] Other (See Comments) 10/20/2018   Zoloft [sertraline] Other (See Comments) 10/20/2018   Hydromorphone Other (See Comments) 10/16/2022   Tape Rash 03/03/2018   Valium [diazepam] Anxiety 04/26/2019    Family History  Problem Relation Age of Onset   Breast cancer Mother    Depression Mother    Heart disease Sister    Lung cancer Maternal Grandfather    Rheum arthritis Paternal Grandmother    AAA (abdominal aortic aneurysm) Paternal Grandfather    Colon cancer Neg Hx    Colon polyps Neg Hx    Crohn's disease Neg Hx    Esophageal cancer Neg Hx    Rectal cancer Neg Hx    Stomach cancer Neg Hx    Ulcerative colitis Neg Hx     Social History   Socioeconomic History   Marital status:  Married    Spouse name: Not on file   Number of children: Not on file   Years of education: Not on file   Highest education level: Not on file  Occupational History   Occupation: Programmer, 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[place] [place]  Tobacco Use   Smoking status: Former    Years: 10.00    Types: Cigarettes    Quit date: 03/17/2007    Years since quitting: 15.7   Smokeless tobacco: Never  Vaping Use   Vaping Use: Never used  Substance and Sexual Activity   Alcohol use: Yes    Comment: Occasional   Drug use: Never   Sexual activity: Not Currently    Birth control/protection: Surgical    Comment: Hysterectomy  Other Topics Concern   Not on file  Social History Narrative   Not on file   Social Determinants of Health   Financial Resource Strain: Not on file  Food Insecurity: Not on file  Transportation Needs: Not on file  Physical Activity: Not on file  Stress: Not on file  Social Connections: Not on file  Intimate Partner Violence: Not on file    Physical Exam: Vital signs in last 24 hours: '@BP'$  118/68   Pulse 81   Temp (!) 97.3 F (36.3 C) (Temporal)   Ht '5\' 9"'$  (1.753 m)   Wt 215 lb (97.5 kg)   SpO2 99%   BMI 31.75 kg/m  GEN: NAD EYE: Sclerae anicteric ENT: MMM CV: Non-tachycardic Pulm: CTA b/l GI: Soft, NT/ND NEURO:  Alert & Oriented x [place], DO [facility]   12/20/2022 11:47 AM

## 2022-12-23 ENCOUNTER — Telehealth: Payer: Self-pay

## 2022-12-23 NOTE — Telephone Encounter (Signed)
  Follow up Call-     12/20/2022   11:11 AM  Call back number  Post procedure Call Back phone  # [phone]  Permission to leave phone message Yes     Patient questions:  Do you have a fever, pain , or abdominal swelling? No. Pain Score  0 *  Have you tolerated food without any problems? Yes.    Have you been able to return to your normal activities? Yes.    Do you have any questions about your discharge instructions: Diet   No. Medications  No. Follow up visit  No.  Do you have questions or concerns about your Care? No.  Actions: * If pain score is 4 or above: No action needed, pain <4.

## 2023-04-16 IMAGING — MG MM DIGITAL SCREENING BILAT W/ TOMO AND CAD
8 series · 8 of 24 positions shown · non-contrast
Comparison: Previous exam(s).

CLINICAL DATA: Screening.

EXAM:
DIGITAL SCREENING BILATERAL MAMMOGRAM WITH TOMOSYNTHESIS AND CAD
TECHNIQUE: Bilateral screening digital craniocaudal and mediolateral oblique
mammograms were obtained. Bilateral screening digital breast
tomosynthesis was performed. The images were evaluated with
computer-aided detection.

[R MLO synth-2D]
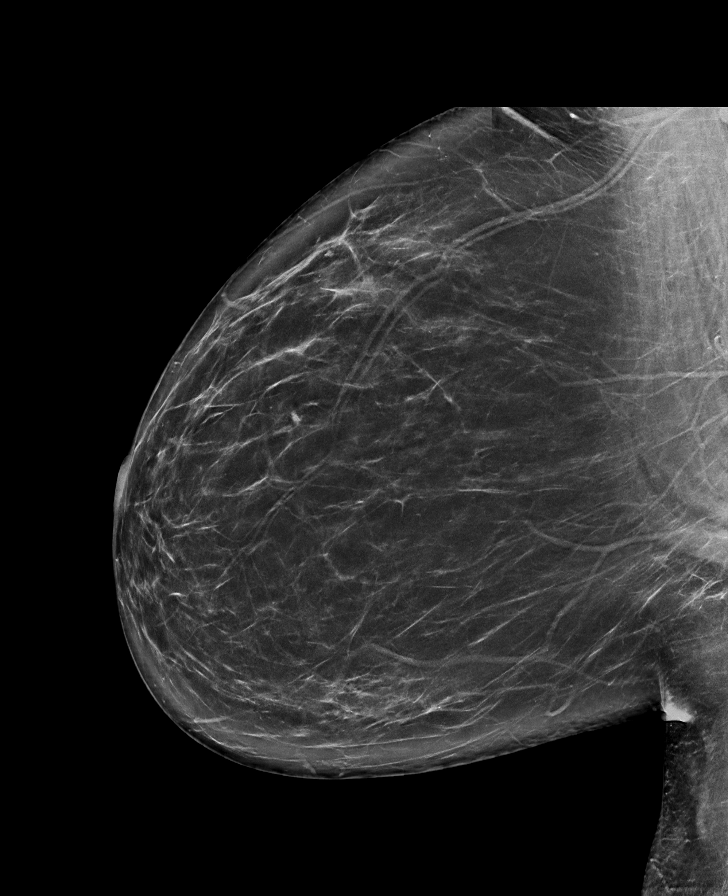

[L CC synth-2D]
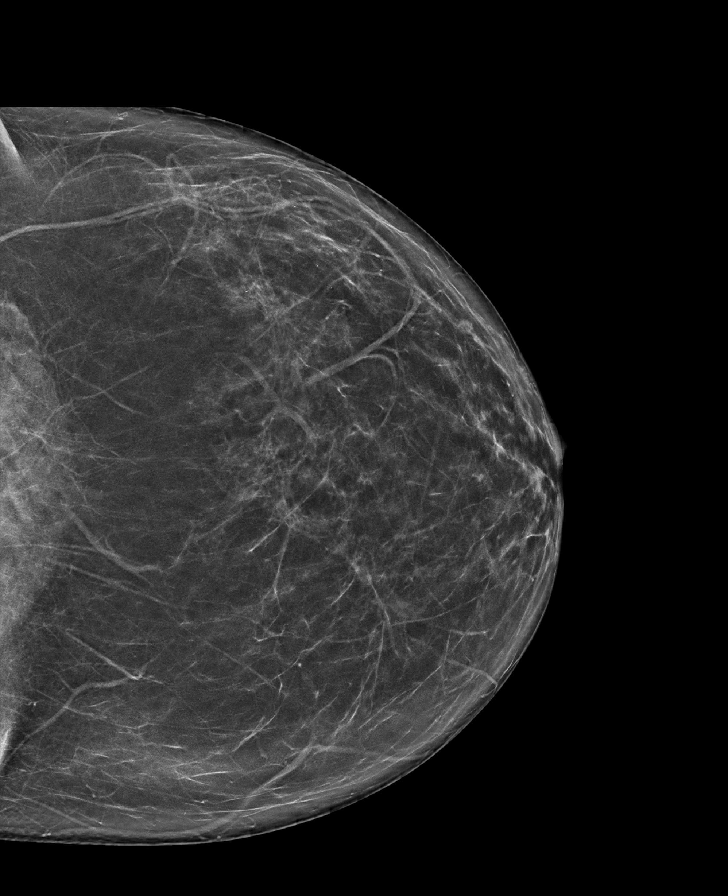

[R CC synth-2D]
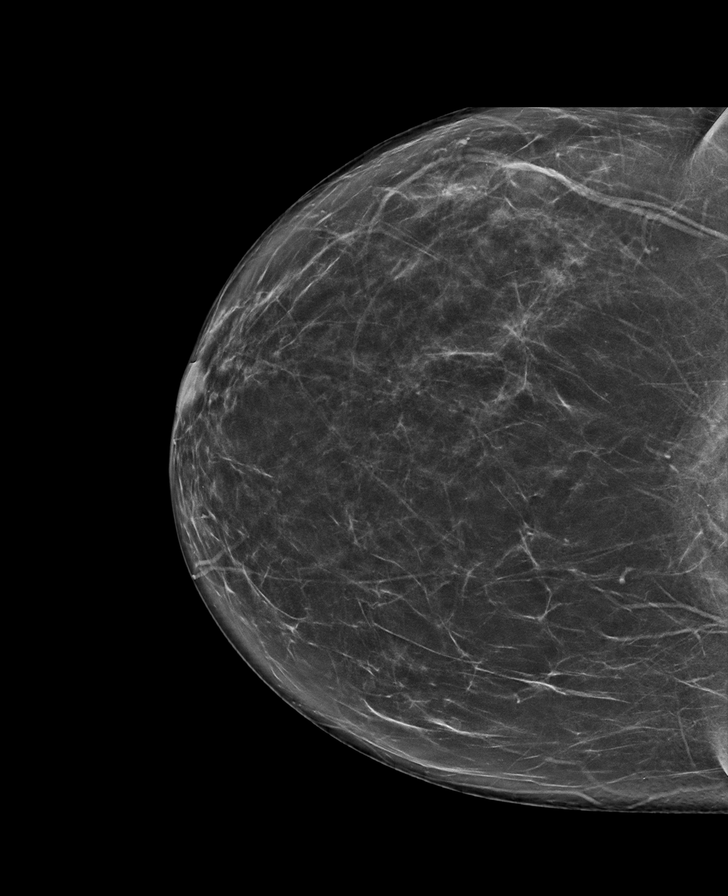

[L MLO synth-2D]
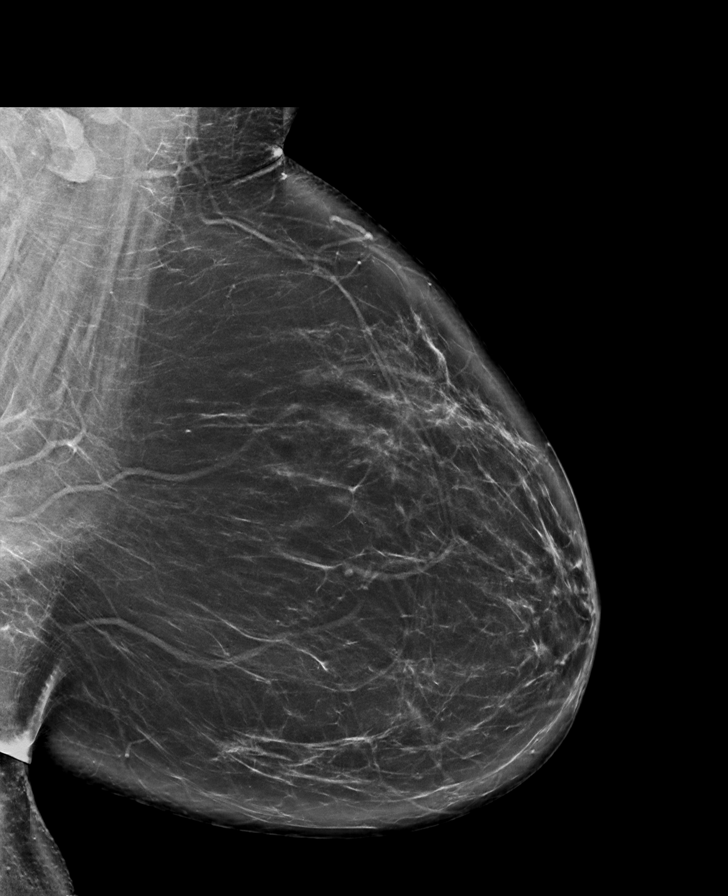

[R MLO tomo · tomo slice 45/90.0]
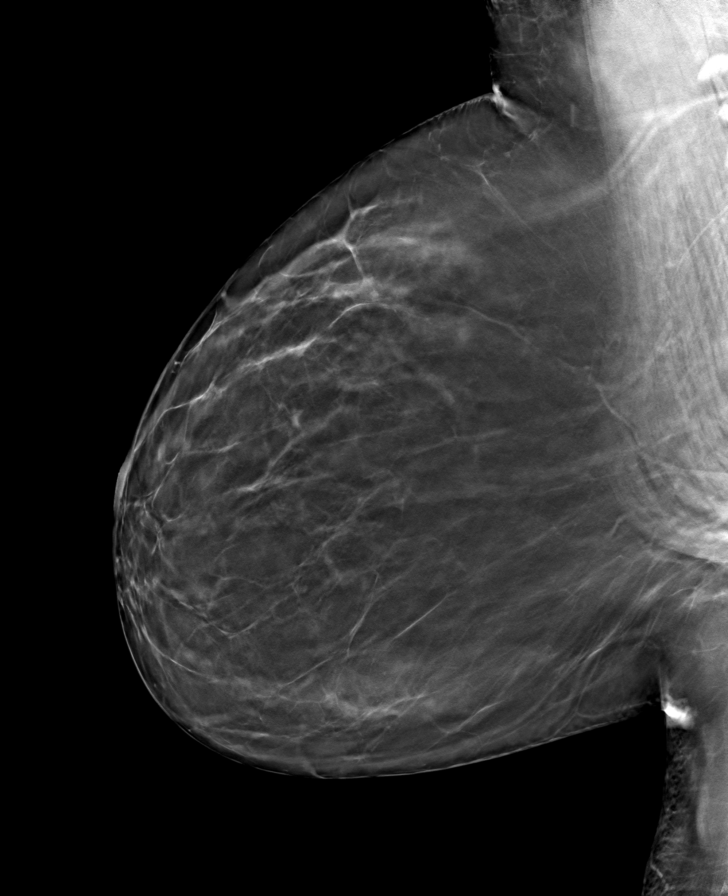

[R CC tomo · tomo slice 39/76.0]
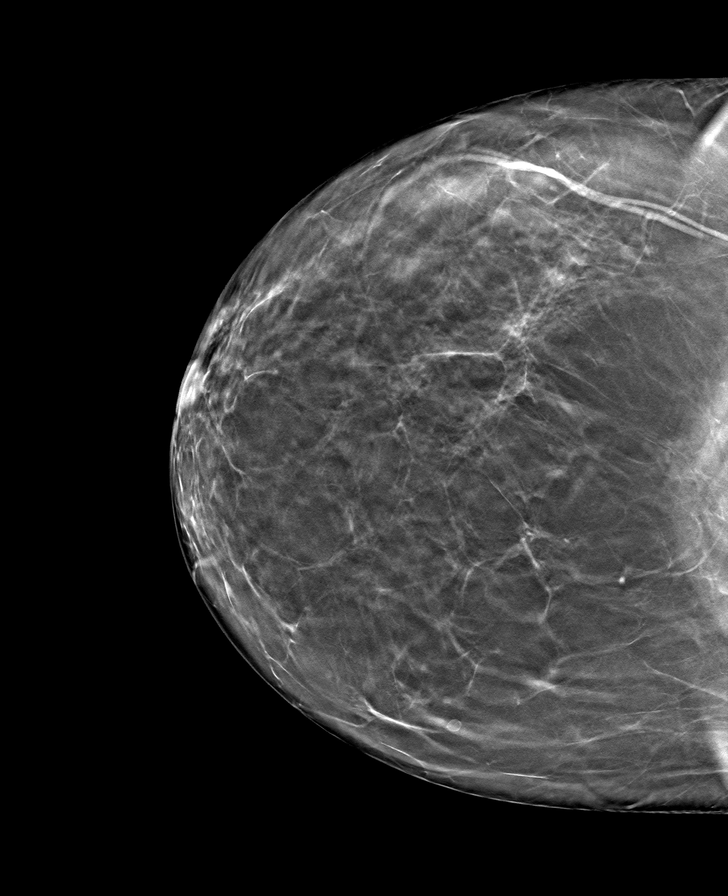

[L CC tomo · tomo slice 37/74.0]
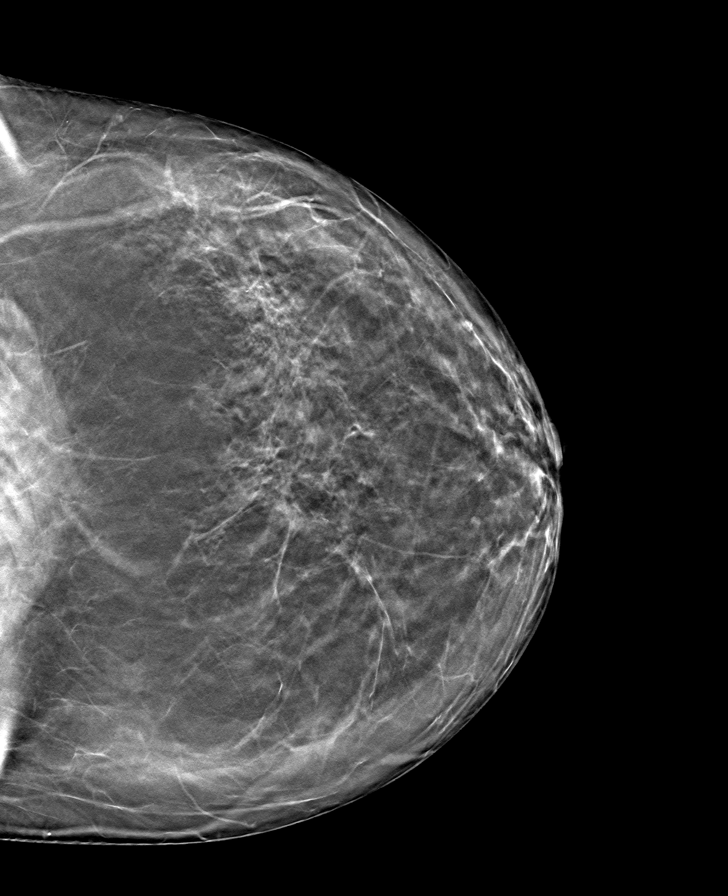

[L MLO tomo · tomo slice 47/94.0]
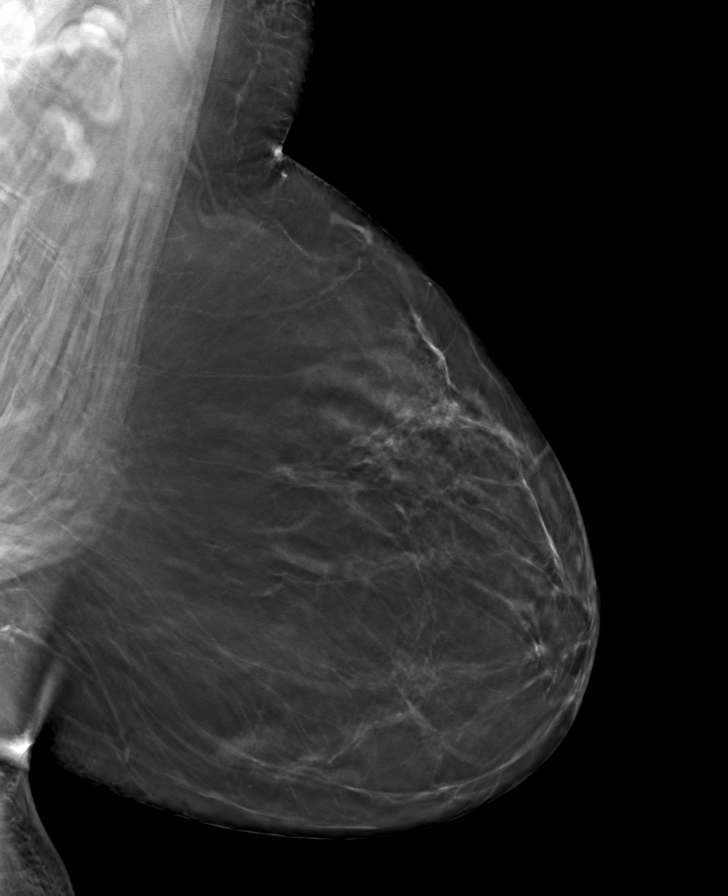

[8 of 24 positions shown; findings below may reference images not displayed]

ACR Breast Density Category b: There are scattered areas of
fibroglandular density.
FINDINGS: There are no findings suspicious for malignancy.
IMPRESSION: No mammographic evidence of malignancy. A result letter of this
screening mammogram will be mailed directly to the patient.

RECOMMENDATION:
Screening mammogram in one year. (Code:51-O-LD2)

BI-RADS CATEGORY  1: Negative.

## 2023-04-23 ENCOUNTER — Other Ambulatory Visit: Payer: Self-pay | Admitting: Oncology

## 2023-04-23 DIAGNOSIS — Z006 Encounter for examination for normal comparison and control in clinical research program: Secondary | ICD-10-CM

## 2023-07-30 ENCOUNTER — Other Ambulatory Visit (HOSPITAL_COMMUNITY): Payer: Commercial Managed Care - PPO

## 2023-11-19 ENCOUNTER — Ambulatory Visit
Admission: RE | Admit: 2023-11-19 | Discharge: 2023-11-19 | Disposition: A | Discharge status: Home / Self Care | Payer: Commercial Managed Care - PPO | Source: Ambulatory Visit | Attending: Internal Medicine

## 2023-11-19 ENCOUNTER — Other Ambulatory Visit: Payer: Self-pay | Admitting: Internal Medicine

## 2023-11-19 DIAGNOSIS — Z1231 Encounter for screening mammogram for malignant neoplasm of breast: Secondary | ICD-10-CM

## 2024-08-17 ENCOUNTER — Other Ambulatory Visit: Payer: Self-pay | Admitting: Medical Genetics

## 2024-08-17 DIAGNOSIS — Z006 Encounter for examination for normal comparison and control in clinical research program: Secondary | ICD-10-CM
